# Patient Record
Sex: Female | Born: 1935 | Race: White | Hispanic: No | Smoking: Current every day smoker
Health system: Southern US, Community
[De-identification: ages and names within clinical notes are randomized; demographics above are authoritative.]

## PROBLEM LIST (undated history)

## (undated) DIAGNOSIS — E119 Type 2 diabetes mellitus without complications: Secondary | ICD-10-CM

## (undated) DIAGNOSIS — K56609 Unspecified intestinal obstruction, unspecified as to partial versus complete obstruction: Secondary | ICD-10-CM

## (undated) DIAGNOSIS — F329 Major depressive disorder, single episode, unspecified: Secondary | ICD-10-CM

## (undated) DIAGNOSIS — I10 Essential (primary) hypertension: Secondary | ICD-10-CM

## (undated) DIAGNOSIS — E785 Hyperlipidemia, unspecified: Secondary | ICD-10-CM

## (undated) DIAGNOSIS — K621 Rectal polyp: Secondary | ICD-10-CM

## (undated) DIAGNOSIS — C569 Malignant neoplasm of unspecified ovary: Secondary | ICD-10-CM

## (undated) DIAGNOSIS — F32A Depression, unspecified: Secondary | ICD-10-CM

## (undated) DIAGNOSIS — K635 Polyp of colon: Secondary | ICD-10-CM

## (undated) DIAGNOSIS — I6529 Occlusion and stenosis of unspecified carotid artery: Secondary | ICD-10-CM

## (undated) DIAGNOSIS — I2510 Atherosclerotic heart disease of native coronary artery without angina pectoris: Secondary | ICD-10-CM

## (undated) HISTORY — PX: ABDOMINAL HYSTERECTOMY: SHX81

## (undated) HISTORY — DX: Atherosclerotic heart disease of native coronary artery without angina pectoris: I25.10

## (undated) HISTORY — DX: Occlusion and stenosis of unspecified carotid artery: I65.29

## (undated) HISTORY — DX: Polyp of colon: K63.5

## (undated) HISTORY — DX: Malignant neoplasm of unspecified ovary: C56.9

## (undated) HISTORY — DX: Depression, unspecified: F32.A

## (undated) HISTORY — DX: Rectal polyp: K62.1

## (undated) HISTORY — DX: Essential (primary) hypertension: I10

## (undated) HISTORY — DX: Hyperlipidemia, unspecified: E78.5

## (undated) HISTORY — PX: ANGIOPLASTY: SHX39

## (undated) HISTORY — DX: Unspecified intestinal obstruction, unspecified as to partial versus complete obstruction: K56.609

## (undated) HISTORY — DX: Type 2 diabetes mellitus without complications: E11.9

## (undated) HISTORY — DX: Major depressive disorder, single episode, unspecified: F32.9

---

## 1998-12-28 ENCOUNTER — Inpatient Hospital Stay: Admission: RE | Admit: 1998-12-28 | Discharge: 1998-12-30 | Payer: Self-pay | Admitting: Vascular Surgery

## 1998-12-30 ENCOUNTER — Encounter: Payer: Self-pay | Admitting: Vascular Surgery

## 2001-12-13 ENCOUNTER — Inpatient Hospital Stay (HOSPITAL_COMMUNITY): Admission: EM | Admit: 2001-12-13 | Discharge: 2001-12-17 | Payer: Self-pay | Admitting: Emergency Medicine

## 2001-12-13 ENCOUNTER — Encounter: Payer: Self-pay | Admitting: Emergency Medicine

## 2002-06-18 ENCOUNTER — Encounter: Payer: Self-pay | Admitting: Cardiology

## 2002-06-18 ENCOUNTER — Inpatient Hospital Stay (HOSPITAL_COMMUNITY): Admission: AD | Admit: 2002-06-18 | Discharge: 2002-06-21 | Payer: Self-pay | Admitting: Cardiology

## 2003-01-26 ENCOUNTER — Encounter: Payer: Self-pay | Admitting: Family Medicine

## 2003-01-26 ENCOUNTER — Encounter: Admission: RE | Admit: 2003-01-26 | Discharge: 2003-01-26 | Payer: Self-pay | Admitting: Family Medicine

## 2003-01-29 ENCOUNTER — Other Ambulatory Visit: Admission: RE | Admit: 2003-01-29 | Discharge: 2003-01-29 | Payer: Self-pay | Admitting: Obstetrics and Gynecology

## 2003-02-03 ENCOUNTER — Ambulatory Visit (HOSPITAL_COMMUNITY): Admission: RE | Admit: 2003-02-03 | Discharge: 2003-02-03 | Payer: Self-pay | Admitting: *Deleted

## 2003-02-03 ENCOUNTER — Ambulatory Visit (HOSPITAL_COMMUNITY): Admission: RE | Admit: 2003-02-03 | Discharge: 2003-02-03 | Payer: Self-pay | Admitting: Gynecology

## 2003-02-03 ENCOUNTER — Encounter: Payer: Self-pay | Admitting: *Deleted

## 2003-02-03 ENCOUNTER — Encounter: Payer: Self-pay | Admitting: Gynecology

## 2003-02-04 ENCOUNTER — Ambulatory Visit: Admission: RE | Admit: 2003-02-04 | Discharge: 2003-02-04 | Payer: Self-pay | Admitting: Gynecology

## 2003-02-10 ENCOUNTER — Encounter (INDEPENDENT_AMBULATORY_CARE_PROVIDER_SITE_OTHER): Payer: Self-pay | Admitting: Specialist

## 2003-02-10 ENCOUNTER — Inpatient Hospital Stay (HOSPITAL_COMMUNITY): Admission: RE | Admit: 2003-02-10 | Discharge: 2003-02-15 | Payer: Self-pay | Admitting: Obstetrics and Gynecology

## 2003-02-12 ENCOUNTER — Encounter: Payer: Self-pay | Admitting: Internal Medicine

## 2003-02-25 ENCOUNTER — Ambulatory Visit: Admission: RE | Admit: 2003-02-25 | Discharge: 2003-02-25 | Payer: Self-pay | Admitting: Gynecology

## 2003-04-29 ENCOUNTER — Ambulatory Visit: Admission: RE | Admit: 2003-04-29 | Discharge: 2003-04-29 | Payer: Self-pay | Admitting: Gynecology

## 2003-09-15 ENCOUNTER — Ambulatory Visit: Admission: RE | Admit: 2003-09-15 | Discharge: 2003-09-15 | Payer: Self-pay | Admitting: Gynecology

## 2004-03-16 ENCOUNTER — Ambulatory Visit: Admission: RE | Admit: 2004-03-16 | Discharge: 2004-03-16 | Payer: Self-pay | Admitting: Gynecology

## 2004-06-07 ENCOUNTER — Ambulatory Visit (HOSPITAL_COMMUNITY): Admission: RE | Admit: 2004-06-07 | Discharge: 2004-06-07 | Payer: Self-pay | Admitting: Oncology

## 2004-07-06 ENCOUNTER — Encounter: Admission: RE | Admit: 2004-07-06 | Discharge: 2004-07-06 | Payer: Self-pay | Admitting: Oncology

## 2004-07-16 ENCOUNTER — Inpatient Hospital Stay (HOSPITAL_COMMUNITY): Admission: EM | Admit: 2004-07-16 | Discharge: 2004-07-19 | Payer: Self-pay | Admitting: Emergency Medicine

## 2004-08-31 ENCOUNTER — Ambulatory Visit: Admission: RE | Admit: 2004-08-31 | Discharge: 2004-08-31 | Payer: Self-pay | Admitting: Gynecology

## 2004-11-28 ENCOUNTER — Ambulatory Visit: Payer: Self-pay | Admitting: Oncology

## 2004-12-15 ENCOUNTER — Ambulatory Visit: Payer: Self-pay | Admitting: Cardiology

## 2005-03-16 ENCOUNTER — Ambulatory Visit: Payer: Self-pay | Admitting: Oncology

## 2005-03-21 ENCOUNTER — Ambulatory Visit: Admission: RE | Admit: 2005-03-21 | Discharge: 2005-03-21 | Payer: Self-pay | Admitting: Gynecology

## 2005-04-05 ENCOUNTER — Ambulatory Visit: Payer: Self-pay | Admitting: Family Medicine

## 2005-04-20 ENCOUNTER — Ambulatory Visit: Payer: Self-pay | Admitting: Family Medicine

## 2005-06-02 ENCOUNTER — Ambulatory Visit: Payer: Self-pay | Admitting: Family Medicine

## 2005-07-24 ENCOUNTER — Encounter: Admission: RE | Admit: 2005-07-24 | Discharge: 2005-07-24 | Payer: Self-pay | Admitting: Oncology

## 2005-09-06 ENCOUNTER — Ambulatory Visit: Payer: Self-pay | Admitting: Family Medicine

## 2005-09-17 ENCOUNTER — Ambulatory Visit: Payer: Self-pay | Admitting: Oncology

## 2006-01-17 ENCOUNTER — Ambulatory Visit: Payer: Self-pay | Admitting: Cardiology

## 2006-01-24 ENCOUNTER — Ambulatory Visit: Payer: Self-pay

## 2006-02-19 ENCOUNTER — Ambulatory Visit: Payer: Self-pay | Admitting: Oncology

## 2006-02-20 LAB — COMPREHENSIVE METABOLIC PANEL
ALT: 14 U/L (ref 0–40)
Albumin: 4.5 g/dL (ref 3.5–5.2)
Alkaline Phosphatase: 78 U/L (ref 39–117)
Glucose, Bld: 216 mg/dL — ABNORMAL HIGH (ref 70–99)
Potassium: 5.7 mEq/L — ABNORMAL HIGH (ref 3.5–5.3)
Sodium: 142 mEq/L (ref 135–145)
Total Bilirubin: 0.3 mg/dL (ref 0.3–1.2)
Total Protein: 7.5 g/dL (ref 6.0–8.3)

## 2006-02-20 LAB — CA 125: CA 125: 14.3 U/mL (ref 0.0–30.2)

## 2006-03-09 ENCOUNTER — Ambulatory Visit: Admission: RE | Admit: 2006-03-09 | Discharge: 2006-03-09 | Payer: Self-pay | Admitting: Gynecology

## 2006-04-03 ENCOUNTER — Ambulatory Visit: Payer: Self-pay | Admitting: Family Medicine

## 2006-04-05 ENCOUNTER — Encounter: Admission: RE | Admit: 2006-04-05 | Discharge: 2006-04-05 | Payer: Self-pay | Admitting: Family Medicine

## 2006-04-17 ENCOUNTER — Ambulatory Visit: Payer: Self-pay | Admitting: Gastroenterology

## 2006-04-20 ENCOUNTER — Ambulatory Visit: Payer: Self-pay | Admitting: Gastroenterology

## 2006-04-20 ENCOUNTER — Encounter (INDEPENDENT_AMBULATORY_CARE_PROVIDER_SITE_OTHER): Payer: Self-pay | Admitting: Specialist

## 2006-04-20 LAB — HM COLONOSCOPY

## 2006-07-25 ENCOUNTER — Encounter: Admission: RE | Admit: 2006-07-25 | Discharge: 2006-07-25 | Payer: Self-pay | Admitting: Oncology

## 2006-07-26 ENCOUNTER — Ambulatory Visit: Payer: Self-pay

## 2006-09-07 ENCOUNTER — Ambulatory Visit: Payer: Self-pay | Admitting: Oncology

## 2006-09-11 LAB — CBC WITH DIFFERENTIAL/PLATELET
Basophils Absolute: 0 10*3/uL (ref 0.0–0.1)
EOS%: 1.8 % (ref 0.0–7.0)
Eosinophils Absolute: 0.2 10*3/uL (ref 0.0–0.5)
HCT: 38.3 % (ref 34.8–46.6)
HGB: 13 g/dL (ref 11.6–15.9)
MCH: 30.2 pg (ref 26.0–34.0)
MCV: 89.3 fL (ref 81.0–101.0)
MONO%: 6 % (ref 0.0–13.0)
NEUT#: 6.2 10*3/uL (ref 1.5–6.5)
NEUT%: 64.9 % (ref 39.6–76.8)

## 2006-09-11 LAB — COMPREHENSIVE METABOLIC PANEL
AST: 16 U/L (ref 0–37)
Albumin: 4.2 g/dL (ref 3.5–5.2)
Alkaline Phosphatase: 70 U/L (ref 39–117)
BUN: 21 mg/dL (ref 6–23)
Calcium: 9.5 mg/dL (ref 8.4–10.5)
Chloride: 104 mEq/L (ref 96–112)
Creatinine, Ser: 0.98 mg/dL (ref 0.40–1.20)
Glucose, Bld: 187 mg/dL — ABNORMAL HIGH (ref 70–99)
Potassium: 5.4 mEq/L — ABNORMAL HIGH (ref 3.5–5.3)

## 2006-12-31 ENCOUNTER — Ambulatory Visit: Payer: Self-pay | Admitting: Cardiology

## 2007-02-22 ENCOUNTER — Ambulatory Visit: Payer: Self-pay | Admitting: Oncology

## 2007-03-22 ENCOUNTER — Ambulatory Visit: Admission: RE | Admit: 2007-03-22 | Discharge: 2007-03-22 | Payer: Self-pay | Admitting: Gynecology

## 2007-04-16 ENCOUNTER — Ambulatory Visit: Payer: Self-pay | Admitting: Family Medicine

## 2007-04-23 ENCOUNTER — Ambulatory Visit: Payer: Self-pay | Admitting: Family Medicine

## 2007-07-30 ENCOUNTER — Ambulatory Visit: Payer: Self-pay

## 2007-08-16 ENCOUNTER — Ambulatory Visit: Payer: Self-pay | Admitting: Family Medicine

## 2007-08-16 DIAGNOSIS — B369 Superficial mycosis, unspecified: Secondary | ICD-10-CM | POA: Insufficient documentation

## 2007-08-16 DIAGNOSIS — E785 Hyperlipidemia, unspecified: Secondary | ICD-10-CM

## 2007-08-16 DIAGNOSIS — F329 Major depressive disorder, single episode, unspecified: Secondary | ICD-10-CM

## 2007-08-16 DIAGNOSIS — I10 Essential (primary) hypertension: Secondary | ICD-10-CM | POA: Insufficient documentation

## 2007-08-16 DIAGNOSIS — E119 Type 2 diabetes mellitus without complications: Secondary | ICD-10-CM | POA: Insufficient documentation

## 2007-09-06 ENCOUNTER — Ambulatory Visit: Payer: Self-pay | Admitting: Oncology

## 2007-09-09 ENCOUNTER — Encounter: Admission: RE | Admit: 2007-09-09 | Discharge: 2007-09-09 | Payer: Self-pay | Admitting: Family Medicine

## 2007-09-10 LAB — CBC WITH DIFFERENTIAL/PLATELET
Basophils Absolute: 0.1 10*3/uL (ref 0.0–0.1)
Eosinophils Absolute: 0.4 10*3/uL (ref 0.0–0.5)
HCT: 32.8 % — ABNORMAL LOW (ref 34.8–46.6)
HGB: 11.6 g/dL (ref 11.6–15.9)
MCH: 30.9 pg (ref 26.0–34.0)
MONO#: 0.6 10*3/uL (ref 0.1–0.9)
NEUT#: 7 10*3/uL — ABNORMAL HIGH (ref 1.5–6.5)
NEUT%: 69.8 % (ref 39.6–76.8)
RDW: 12.7 % (ref 11.3–14.5)
WBC: 10.1 10*3/uL — ABNORMAL HIGH (ref 3.9–10.0)
lymph#: 2 10*3/uL (ref 0.9–3.3)

## 2007-09-10 LAB — COMPREHENSIVE METABOLIC PANEL
AST: 16 U/L (ref 0–37)
Albumin: 4.2 g/dL (ref 3.5–5.2)
BUN: 21 mg/dL (ref 6–23)
CO2: 25 mEq/L (ref 19–32)
Calcium: 9.5 mg/dL (ref 8.4–10.5)
Chloride: 105 mEq/L (ref 96–112)
Creatinine, Ser: 0.93 mg/dL (ref 0.40–1.20)
Glucose, Bld: 144 mg/dL — ABNORMAL HIGH (ref 70–99)
Potassium: 4.8 mEq/L (ref 3.5–5.3)

## 2007-09-10 LAB — CA 125: CA 125: 13.9 U/mL (ref 0.0–30.2)

## 2008-01-03 ENCOUNTER — Ambulatory Visit: Payer: Self-pay | Admitting: Cardiology

## 2008-02-25 ENCOUNTER — Ambulatory Visit: Payer: Self-pay | Admitting: Oncology

## 2008-02-28 LAB — CA 125: CA 125: 16.1 U/mL (ref 0.0–30.2)

## 2008-03-04 ENCOUNTER — Ambulatory Visit: Admission: RE | Admit: 2008-03-04 | Discharge: 2008-03-04 | Payer: Self-pay | Admitting: Gynecology

## 2008-03-05 ENCOUNTER — Ambulatory Visit: Payer: Self-pay | Admitting: Family Medicine

## 2008-03-05 DIAGNOSIS — R197 Diarrhea, unspecified: Secondary | ICD-10-CM | POA: Insufficient documentation

## 2008-03-10 ENCOUNTER — Ambulatory Visit: Payer: Self-pay | Admitting: Family Medicine

## 2008-03-10 LAB — CONVERTED CEMR LAB: Blood Glucose, Fingerstick: 272

## 2008-03-30 ENCOUNTER — Ambulatory Visit: Payer: Self-pay | Admitting: Family Medicine

## 2008-03-30 DIAGNOSIS — C569 Malignant neoplasm of unspecified ovary: Secondary | ICD-10-CM

## 2008-03-30 LAB — CONVERTED CEMR LAB
BUN: 14 mg/dL (ref 6–23)
Chloride: 98 meq/L (ref 96–112)
Glucose, Bld: 92 mg/dL (ref 70–99)
Hgb A1c MFr Bld: 6.8 % — ABNORMAL HIGH (ref 4.6–6.0)
Microalb Creat Ratio: 49.2 mg/g — ABNORMAL HIGH (ref 0.0–30.0)
Microalb, Ur: 5.1 mg/dL — ABNORMAL HIGH (ref 0.0–1.9)
Potassium: 4.3 meq/L (ref 3.5–5.1)
Sodium: 139 meq/L (ref 135–145)

## 2008-04-04 LAB — CONVERTED CEMR LAB
ALT: 17 units/L (ref 0–40)
AST: 20 units/L (ref 0–37)
Albumin: 3.9 g/dL (ref 3.5–5.2)
Alkaline Phosphatase: 62 units/L (ref 39–117)
BUN: 18 mg/dL (ref 6–23)
Basophils Absolute: 0.1 10*3/uL (ref 0.0–0.1)
Basophils Relative: 0.7 % (ref 0.0–1.0)
Bilirubin, Direct: 0.1 mg/dL (ref 0.0–0.3)
CO2: 26 meq/L (ref 19–32)
Calcium: 9.2 mg/dL (ref 8.4–10.5)
Chloride: 104 meq/L (ref 96–112)
Cholesterol: 137 mg/dL (ref 0–200)
Creatinine, Ser: 0.9 mg/dL (ref 0.4–1.2)
Creatinine,U: 93.8 mg/dL
Direct LDL: 58.4 mg/dL
Eosinophils Absolute: 0.2 10*3/uL (ref 0.0–0.6)
Eosinophils Relative: 2.5 % (ref 0.0–5.0)
GFR calc Af Amer: 78 mL/min
GFR calc non Af Amer: 64 mL/min
Glucose, Bld: 107 mg/dL — ABNORMAL HIGH (ref 70–99)
HCT: 38.6 % (ref 36.0–46.0)
HDL: 32.6 mg/dL — ABNORMAL LOW (ref >39.0)
Hemoglobin: 12.9 g/dL (ref 12.0–15.0)
Hgb A1c MFr Bld: 6.6 % — ABNORMAL HIGH (ref 4.6–6.0)
Lymphocytes Relative: 27.3 % (ref 12.0–46.0)
MCHC: 33.4 g/dL (ref 30.0–36.0)
MCV: 89 fL (ref 78.0–100.0)
Microalb Creat Ratio: 23.5 mg/g (ref 0.0–30.0)
Microalb, Ur: 2.2 mg/dL — ABNORMAL HIGH (ref 0.0–1.9)
Monocytes Absolute: 0.8 10*3/uL — ABNORMAL HIGH (ref 0.2–0.7)
Monocytes Relative: 7.7 % (ref 3.0–11.0)
Neutro Abs: 6.2 10*3/uL (ref 1.4–7.7)
Neutrophils Relative %: 61.8 % (ref 43.0–77.0)
Platelets: 277 10*3/uL (ref 150–400)
Potassium: 4.5 meq/L (ref 3.5–5.1)
RBC: 4.34 M/uL (ref 3.87–5.11)
RDW: 13.9 % (ref 11.5–14.6)
Sodium: 139 meq/L (ref 135–145)
TSH: 3.01 microintl units/mL (ref 0.35–5.50)
Total Bilirubin: 0.3 mg/dL (ref 0.3–1.2)
Total CHOL/HDL Ratio: 4.2
Total Protein: 6.9 g/dL (ref 6.0–8.3)
Triglycerides: 303 mg/dL (ref 0–149)
VLDL: 61 mg/dL — ABNORMAL HIGH (ref 0–40)
WBC: 10 10*3/uL (ref 4.5–10.5)

## 2008-04-06 ENCOUNTER — Telehealth: Payer: Self-pay | Admitting: *Deleted

## 2008-07-30 ENCOUNTER — Ambulatory Visit: Payer: Self-pay | Admitting: Cardiology

## 2008-09-09 ENCOUNTER — Encounter: Admission: RE | Admit: 2008-09-09 | Discharge: 2008-09-09 | Payer: Self-pay | Admitting: Family Medicine

## 2008-12-28 ENCOUNTER — Ambulatory Visit: Payer: Self-pay | Admitting: Cardiology

## 2009-03-19 ENCOUNTER — Telehealth: Payer: Self-pay | Admitting: Cardiology

## 2009-05-28 ENCOUNTER — Ambulatory Visit: Payer: Self-pay | Admitting: Internal Medicine

## 2009-05-28 ENCOUNTER — Inpatient Hospital Stay (HOSPITAL_COMMUNITY): Admission: EM | Admit: 2009-05-28 | Discharge: 2009-06-02 | Payer: Self-pay | Admitting: Emergency Medicine

## 2009-05-28 ENCOUNTER — Encounter (INDEPENDENT_AMBULATORY_CARE_PROVIDER_SITE_OTHER): Payer: Self-pay | Admitting: *Deleted

## 2009-05-28 ENCOUNTER — Ambulatory Visit: Payer: Self-pay | Admitting: Endocrinology

## 2009-05-29 ENCOUNTER — Telehealth: Payer: Self-pay | Admitting: Internal Medicine

## 2009-06-07 ENCOUNTER — Ambulatory Visit: Payer: Self-pay | Admitting: Family Medicine

## 2009-06-07 DIAGNOSIS — R634 Abnormal weight loss: Secondary | ICD-10-CM

## 2009-06-07 DIAGNOSIS — G4700 Insomnia, unspecified: Secondary | ICD-10-CM | POA: Insufficient documentation

## 2009-06-10 LAB — CONVERTED CEMR LAB
Albumin: 3.6 g/dL (ref 3.5–5.2)
Basophils Absolute: 0 10*3/uL (ref 0.0–0.1)
Basophils Relative: 0.4 % (ref 0.0–3.0)
CO2: 30 meq/L (ref 19–32)
Calcium: 9.2 mg/dL (ref 8.4–10.5)
Chloride: 104 meq/L (ref 96–112)
Creatinine, Ser: 0.8 mg/dL (ref 0.4–1.2)
Eosinophils Absolute: 0.3 10*3/uL (ref 0.0–0.7)
Folate: >20 ng/mL
Glucose, Bld: 94 mg/dL (ref 70–99)
Hemoglobin: 12.4 g/dL (ref 12.0–15.0)
Lymphocytes Relative: 20.9 % (ref 12.0–46.0)
Lymphs Abs: 2.2 10*3/uL (ref 0.7–4.0)
MCHC: 33.9 g/dL (ref 30.0–36.0)
MCV: 87.9 fL (ref 78.0–100.0)
Monocytes Absolute: 1 10*3/uL (ref 0.1–1.0)
Neutro Abs: 7.2 10*3/uL (ref 1.4–7.7)
RBC: 4.15 M/uL (ref 3.87–5.11)
RDW: 14.5 % (ref 11.5–14.6)
Saturation Ratios: 14.3 % — ABNORMAL LOW (ref 20.0–50.0)
Sodium: 142 meq/L (ref 135–145)
TSH: 3.06 microintl units/mL (ref 0.35–5.50)
Total Protein: 7.6 g/dL (ref 6.0–8.3)
Vitamin B-12: 664 pg/mL (ref 211–911)

## 2009-06-22 ENCOUNTER — Encounter: Payer: Self-pay | Admitting: Cardiology

## 2009-07-12 ENCOUNTER — Ambulatory Visit: Payer: Self-pay | Admitting: Gastroenterology

## 2009-07-21 ENCOUNTER — Ambulatory Visit (HOSPITAL_COMMUNITY): Admission: RE | Admit: 2009-07-21 | Discharge: 2009-07-21 | Payer: Self-pay | Admitting: Gastroenterology

## 2009-07-22 ENCOUNTER — Telehealth: Payer: Self-pay | Admitting: Gastroenterology

## 2009-07-22 DIAGNOSIS — R6881 Early satiety: Secondary | ICD-10-CM

## 2009-07-23 ENCOUNTER — Ambulatory Visit: Payer: Self-pay | Admitting: Gastroenterology

## 2009-07-28 ENCOUNTER — Encounter: Payer: Self-pay | Admitting: Gastroenterology

## 2009-07-28 ENCOUNTER — Ambulatory Visit: Payer: Self-pay | Admitting: Gastroenterology

## 2009-07-30 ENCOUNTER — Encounter: Payer: Self-pay | Admitting: Gastroenterology

## 2009-08-04 ENCOUNTER — Ambulatory Visit: Payer: Self-pay

## 2009-08-06 ENCOUNTER — Encounter: Payer: Self-pay | Admitting: Cardiology

## 2009-09-22 ENCOUNTER — Telehealth: Payer: Self-pay | Admitting: Family Medicine

## 2009-09-28 ENCOUNTER — Encounter: Payer: Self-pay | Admitting: Cardiology

## 2009-11-05 ENCOUNTER — Encounter (INDEPENDENT_AMBULATORY_CARE_PROVIDER_SITE_OTHER): Payer: Self-pay | Admitting: *Deleted

## 2009-12-29 ENCOUNTER — Encounter: Payer: Self-pay | Admitting: Cardiology

## 2010-01-03 ENCOUNTER — Ambulatory Visit: Payer: Self-pay | Admitting: Cardiology

## 2010-02-07 ENCOUNTER — Encounter: Payer: Self-pay | Admitting: Cardiology

## 2010-02-07 DIAGNOSIS — I6529 Occlusion and stenosis of unspecified carotid artery: Secondary | ICD-10-CM | POA: Insufficient documentation

## 2010-02-08 ENCOUNTER — Ambulatory Visit: Payer: Self-pay

## 2010-02-08 ENCOUNTER — Encounter: Payer: Self-pay | Admitting: Cardiology

## 2010-03-31 ENCOUNTER — Encounter: Payer: Self-pay | Admitting: Cardiology

## 2010-04-06 ENCOUNTER — Encounter: Payer: Self-pay | Admitting: Cardiology

## 2010-06-03 ENCOUNTER — Telehealth: Payer: Self-pay | Admitting: Family Medicine

## 2010-07-07 ENCOUNTER — Encounter: Payer: Self-pay | Admitting: Cardiology

## 2010-08-08 ENCOUNTER — Encounter: Payer: Self-pay | Admitting: Cardiology

## 2010-08-09 ENCOUNTER — Ambulatory Visit: Payer: Self-pay

## 2010-08-09 ENCOUNTER — Encounter: Payer: Self-pay | Admitting: Cardiology

## 2010-10-11 ENCOUNTER — Encounter: Payer: Self-pay | Admitting: Cardiology

## 2010-10-17 ENCOUNTER — Telehealth: Payer: Self-pay | Admitting: Family Medicine

## 2010-11-14 ENCOUNTER — Emergency Department (HOSPITAL_COMMUNITY)
Admission: EM | Admit: 2010-11-14 | Discharge: 2010-11-15 | Payer: Self-pay | Source: Home / Self Care | Admitting: Emergency Medicine

## 2010-11-16 LAB — CBC
HCT: 39.9 % (ref 36.0–46.0)
Hemoglobin: 13.1 g/dL (ref 12.0–15.0)
MCH: 28.8 pg (ref 26.0–34.0)
MCHC: 32.8 g/dL (ref 30.0–36.0)
MCV: 87.7 fL (ref 78.0–100.0)
Platelets: 287 10*3/uL (ref 150–400)
RBC: 4.55 MIL/uL (ref 3.87–5.11)
RDW: 14.4 % (ref 11.5–15.5)
WBC: 14.5 10*3/uL — ABNORMAL HIGH (ref 4.0–10.5)

## 2010-11-16 LAB — COMPREHENSIVE METABOLIC PANEL
ALT: 15 U/L (ref 0–35)
AST: 21 U/L (ref 0–37)
Albumin: 3.5 g/dL (ref 3.5–5.2)
Alkaline Phosphatase: 73 U/L (ref 39–117)
BUN: 13 mg/dL (ref 6–23)
CO2: 26 mEq/L (ref 19–32)
Calcium: 9.3 mg/dL (ref 8.4–10.5)
Chloride: 104 mEq/L (ref 96–112)
Creatinine, Ser: 0.78 mg/dL (ref 0.4–1.2)
GFR calc Af Amer: >60 mL/min (ref >60)
GFR calc non Af Amer: >60 mL/min (ref >60)
Glucose, Bld: 164 mg/dL — ABNORMAL HIGH (ref 70–99)
Potassium: 5 mEq/L (ref 3.5–5.1)
Sodium: 139 mEq/L (ref 135–145)
Total Bilirubin: 0.4 mg/dL (ref 0.3–1.2)
Total Protein: 7 g/dL (ref 6.0–8.3)

## 2010-11-16 LAB — DIFFERENTIAL
Basophils Absolute: 0.1 10*3/uL (ref 0.0–0.1)
Basophils Relative: 0 % (ref 0–1)
Eosinophils Absolute: 0.2 10*3/uL (ref 0.0–0.7)
Eosinophils Relative: 1 % (ref 0–5)
Lymphocytes Relative: 13 % (ref 12–46)
Lymphs Abs: 1.8 10*3/uL (ref 0.7–4.0)
Monocytes Absolute: 0.8 10*3/uL (ref 0.1–1.0)
Monocytes Relative: 5 % (ref 3–12)
Neutro Abs: 11.7 10*3/uL — ABNORMAL HIGH (ref 1.7–7.7)
Neutrophils Relative %: 81 % — ABNORMAL HIGH (ref 43–77)

## 2010-11-17 ENCOUNTER — Telehealth: Payer: Self-pay | Admitting: Family Medicine

## 2010-11-17 DIAGNOSIS — R42 Dizziness and giddiness: Secondary | ICD-10-CM | POA: Insufficient documentation

## 2010-11-29 NOTE — Progress Notes (Signed)
Summary: [name] refill  Phone Note Refill Request Message from:  Fax from Pharmacy on June 03, 2010 8:58 AM  Refills Requested: Medication #1:  AMBIEN 5 MG TABS 1/2 at bedtime as needed. Initial call taken by: Kern Reap CMA Duncan Dull),  June 03, 2010 8:58 AM Caller: [facility] [name] DrMarland Kitchen    Prescriptions: [name] 5 MG TABS (ZOLPIDEM TARTRATE) 1/2 at bedtime as needed  #30 x 3   Entered by:   Kern Reap CMA (AAMA)   Authorized by:   Roderick Pee MD   Signed by:   Kern Reap CMA (AAMA) on 06/03/2010   Method used:   Historical   RxID:   [id number]

## 2010-11-29 NOTE — Letter (Signed)
Summary: Appointment - Reminder 2  [facility], Main Office  [address]   [place], [place] [address]   Phone: [phone]  Fax: [phone]     November 05, 2009 MRN: [id number]   [name] [address] [place], [place]  [address]   Dear [name],  Our records indicate that it is time to schedule a follow-up appointment. Dr.Hochrein recommended that you follow up with [place] in March,2011. It is very important that we reach you to schedule this appointment. We look forward to participating in your health care needs. Please contact us at the number listed above at your earliest convenience to schedule your appointment.  If you are unable to make an appointment at this time, give [place] a call so we can update our records.     Sincerely,   Glass blower/designer

## 2010-11-29 NOTE — Miscellaneous (Signed)
Summary: Orders Update  Clinical Lists Changes  Orders: Added new Test order of Carotid Duplex (Carotid Duplex) - Signed 

## 2010-11-29 NOTE — Progress Notes (Signed)
Summary: [facility]   Imported By: Earl Many 04/06/2010 16:44:41  _____________________________________________________________________  External Attachment:    Type:   Image     Comment:   External Document

## 2010-11-29 NOTE — Assessment & Plan Note (Signed)
Summary: 1 yr rov f/u 414.01  pfh   Visit Type:  Follow-up Primary Provider:  Kelle Darting, MD   CC:  CAD.  History of Present Illness: The patient presents for follow up of her known CAD.  Since I last saw her she has had no new cardiac complaints.  She denies chest pain, neck or arm pain.  She has had no new dyspnea and denies PND or orthopnea.  She has had some mild ankle edema.  She has her diabetes and lipids followed by her her endocrinologist. She reports she's had excellent lipids and hemoglobin A1c.  Current Medications (verified): 1)  Lasix 20 Mg  Tabs (Furosemide) .... One Qam 2)  Metformin Hcl 1000 Mg Tabs (Metformin Hcl) .... 1/2 Two Times A Day 3)  Metoprolol Tartrate 100 Mg Tabs (Metoprolol Tartrate) .... Take 1/2 in Am and 1/2 in Pm 4)  Nifedipine Cr Osmotic 90 Mg Tb24 (Nifedipine) .... Once Daily 5)  Nitroquick 0.4 Mg Subl (Nitroglycerin) .... Place 1 Tablet Under Tongue As Directed 6)  Zocor 40 Mg Tabs (Simvastatin) .Marland Kitchen.. 1 Once Daily 7)  Truetrack Test   Strp (Glucose Blood) .... Two Times A Day 8)  Aspirin 81 Mg  Tabs (Aspirin) .Marland Kitchen.. 1 By Mouth Daily 9)  Calcium-Vitamin D 600-125 Mg-Unit  Tabs (Calcium-Vitamin D) .... Once Daily 10)  Fish Oil 1000 Mg  Caps (Omega-3 Fatty Acids) .... Once Daily 11)  Ramipril 10 Mg Caps (Ramipril) .... Take One Tab Once Daily 12)  Januvia 100 Mg Tabs (Sitagliptin Phosphate) .... Take One Tab Once Daily 13)  Ra Melatonin/b-6 500-5 Mcg-Mg Tabs (Nutritional Supplements) .... Take One Tab Once Daily 14)  Ambien 5 Mg Tabs (Zolpidem Tartrate) .... 1/2 At Bedtime As Needed  Allergies (verified): No Known Drug Allergies  Past History:  Past Medical History: Coronary artery disease (inferior myocardial infarction       2002, treated with streptokinase.  Cath showed a 50% right coronary       artery stenosis at that time.  The patient had a catheterization in       2002 demonstrated 75% RCA stenosis, long 60% proximal RCA stenosis,   50% mid RCA stenosis, 90% mid-LAD stenosis.  Dr. Juanda Chance performed       stenting of the RCA in the LAD.  Last catheterization was in 2005       demonstrating a 95% in-stent restenosis in the right coronary       artery.  This was treated by Dr. Juanda Chance with a Taxus drug-eluting       stent).  Depression Diabetes mellitus, type II Hyperlipidemia Hypertension Adenomatous polyp colon Rectal hyperplastic polyp Ovarian cancer,cardiac stents x 2 Bilateral cataracts Small Bowel Obstruction PVD  Past Surgical History: Hysterectomy Angioplasty/stent   Review of Systems       As stated in the HPI and negative for all other systems.   Vital Signs:  Patient profile:   77 year old female Height:      59.25 inches Weight:      94 pounds BMI:     18.89 Pulse rate:   61 / minute Resp:     16 per minute BP sitting:   160 / 62  (right arm)  Vitals Entered By: Marrion Coy, CNA (January 03, 2010 12:12 PM)  Physical Exam  General:  Well developed, well nourished, in no acute distress. Head:  normocephalic and atraumatic Eyes:  PERRLA/EOM intact; conjunctiva and lids normal. Mouth:  Oral mucosa normal.  Neck:  Neck supple, no JVD. No masses, thyromegaly or abnormal cervical nodes. Lungs:  Clear bilaterally to auscultation and percussion. Heart:  Non-displaced PMI, chest non-tender; regular rate and rhythm, S1, S2 without murmurs, rubs or gallops. Carotid upstroke normal, bilateral carotid bruits. Normal abdominal aortic size, no bruits. Femorals normal pulses, no bruits. Pedals normal pulses. No edema, no varicosities. Abdomen:  Bowel sounds positive; abdomen soft and non-tender without masses, organomegaly, or hernias noted. No hepatosplenomegaly. Msk:  Back normal, normal gait. Muscle strength and tone normal. Extremities:  No clubbing or cyanosis. Neurologic:  Alert and oriented x 3. Skin:  Intact without lesions or rashes. Cervical Nodes:  no significant adenopathy Axillary Nodes:  no  significant adenopathy Inguinal Nodes:  no significant adenopathy Psych:  Normal affect.   EKG  Procedure date:  01/03/2010  Findings:      NSR, rate 61, RBBB, no acute ST-T wave changes  Impression & Recommendations:  Problem # 1:  CORONARY ARTERY DISEASE (ICD-414.00) The patient has had no new symptoms. She will continue with risk reduction. Orders: EKG w/ Interpretation (93000)  Problem # 2:  HYPERTENSION (ICD-401.9) Her blood pressure is elevated today but she reports that it is well controlled at home in the 120/60 range. She says her blood pressure cuff is accurate. I will make no change to her regimen although she is instructed to get her blood pressure cuff readings correlated with readings in the physician office and to keep a blood pressure diary.  Problem # 3:  HYPERLIPIDEMIA (ICD-272.4) She reports excellent readings as described above. I will defer to her endocrinologist with a goal LDL less than 70 and HDL rated and 50.  Patient Instructions: 1)  Your physician recommends that you schedule a follow-up appointment in: 1 year with Dr Antoine Poche 2)  Your physician recommends that you continue on your current medications as directed. Please refer to the Current Medication list given to you today.

## 2010-11-29 NOTE — Letter (Signed)
Summary: [facility] Note  [facility] Note   Imported By: Roderic Ovens 01/13/2010 14:50:44  _____________________________________________________________________  External Attachment:    Type:   Image     Comment:   External Document

## 2010-11-29 NOTE — Letter (Signed)
Summary: [facility] Progress Note   [facility] Progress Note   Imported By: Roderic Ovens 07/29/2010 11:54:37  _____________________________________________________________________  External Attachment:    Type:   Image     Comment:   External Document

## 2010-11-29 NOTE — Miscellaneous (Signed)
Summary: Orders Update  Clinical Lists Changes  Problems: Added new problem of CAROTID ARTERY DISEASE (ICD-433.10) Orders: Added new Test order of Carotid Duplex (Carotid Duplex) - Signed 

## 2010-12-01 NOTE — Progress Notes (Signed)
Summary: lost rx  Phone Note Refill Request Message from:  Fax from Pharmacy on October 17, 2010 5:30 PM  Refills Requested: Medication #1:  AMBIEN 5 MG TABS 1/2 at bedtime as needed. patient states she has lost her rx and requests refill.  okay to fill [facility]? [facility]  Initial call taken by: Kern Reap CMA Duncan Dull),  October 17, 2010 5:31 PM  Follow-up for Phone Call        dispense 30 tablets, directions one half nightly, p.r.n. refills x 3 Follow-up by: Roderick Pee MD,  October 17, 2010 5:33 PM     Appended Document: lost rx dispense 30 tablets, directions one half nightly, p.r.n. refills x 3  Appended Document: lost rx rx called into pharmacy and patient is aware

## 2010-12-01 NOTE — Letter (Signed)
Summary: [facility] Progress Note   [facility] Progress Note   Imported By: Roderic Ovens 10/27/2010 10:08:57  _____________________________________________________________________  External Attachment:    Type:   Image     Comment:   External Document

## 2010-12-01 NOTE — Progress Notes (Signed)
Summary: REQUEST FOR APPT?  Phone Note Call from Patient   Caller: Daughter    [phone] Summary of Call: Daughter called to adv that pt has been exp dizziness..... Went to ED for eval, dx: vertigo - adv pt to take otc meds.... Adv that sxs worse today and she needs to come in to see Dr Tawanna Cooler today.... Offered appt for tomorrow morning but pts daughter declined stating that her mom needs to see someone today.... Can you advise?   # [phone].  Initial call taken by: Debbra Riding,  November 17, 2010 11:11 AM  Follow-up for Phone Call        Fleet Contras please call......... unfortunately the only treatment for vertigo is bedrest.  There is no medication.  That will make it go away.  She can try some over-the-counter Antivert 25 mg 3 times a day.if the vertigo persists for more than a week or two and then we recommend ENT consult.  Dr. Ezzard Standing Follow-up by: Roderick Pee MD,  November 17, 2010 11:30 AM  Additional Follow-up for Phone Call Additional follow up Details #1::        Spoke with pt and she adv that she has been trying otc meds... adv was given Rx by ED physician for the Antivert but she thinks she may be having adverse side effects from med (nausea) may be related to dizziness?... She adv this has been going on for a while and she would like appt with Dr Ezzard Standing this week if possible (referral to ent).... Daughter Inocencio Homes) got on the line and reinterated same.  Additional Follow-up by: Debbra Riding,  November 17, 2010 11:58 AM  New Problems: VERTIGO (ICD-780.4)   New Problems: VERTIGO (ICD-780.4)

## 2011-02-04 LAB — BASIC METABOLIC PANEL
BUN: 6 mg/dL (ref 6–23)
CO2: 22 mEq/L (ref 19–32)
CO2: 25 mEq/L (ref 19–32)
CO2: 27 mEq/L (ref 19–32)
Calcium: 7.9 mg/dL — ABNORMAL LOW (ref 8.4–10.5)
Calcium: 8.3 mg/dL — ABNORMAL LOW (ref 8.4–10.5)
Chloride: 109 mEq/L (ref 96–112)
Creatinine, Ser: 0.58 mg/dL (ref 0.4–1.2)
Creatinine, Ser: 0.7 mg/dL (ref 0.4–1.2)
GFR calc Af Amer: >60 mL/min (ref >60)
GFR calc Af Amer: >60 mL/min (ref >60)
GFR calc Af Amer: >60 mL/min (ref >60)
GFR calc non Af Amer: >60 mL/min (ref >60)
GFR calc non Af Amer: >60 mL/min (ref >60)
Glucose, Bld: 103 mg/dL — ABNORMAL HIGH (ref 70–99)
Glucose, Bld: 105 mg/dL — ABNORMAL HIGH (ref 70–99)
Potassium: 2.8 mEq/L — ABNORMAL LOW (ref 3.5–5.1)
Potassium: 4.7 mEq/L (ref 3.5–5.1)
Sodium: 136 mEq/L (ref 135–145)
Sodium: 138 mEq/L (ref 135–145)
Sodium: 139 mEq/L (ref 135–145)
Sodium: 141 mEq/L (ref 135–145)

## 2011-02-04 LAB — CBC
HCT: 29 % — ABNORMAL LOW (ref 36.0–46.0)
HCT: 33.5 % — ABNORMAL LOW (ref 36.0–46.0)
Hemoglobin: 10.7 g/dL — ABNORMAL LOW (ref 12.0–15.0)
Hemoglobin: 11.4 g/dL — ABNORMAL LOW (ref 12.0–15.0)
Hemoglobin: 9.8 g/dL — ABNORMAL LOW (ref 12.0–15.0)
MCHC: 33.5 g/dL (ref 30.0–36.0)
MCHC: 33.7 g/dL (ref 30.0–36.0)
MCHC: 33.9 g/dL (ref 30.0–36.0)
MCV: 87.3 fL (ref 78.0–100.0)
MCV: 87.9 fL (ref 78.0–100.0)
Platelets: 196 10*3/uL (ref 150–400)
RBC: 3.82 MIL/uL — ABNORMAL LOW (ref 3.87–5.11)
RDW: 14.7 % (ref 11.5–15.5)
RDW: 14.7 % (ref 11.5–15.5)
RDW: 15.2 % (ref 11.5–15.5)

## 2011-02-04 LAB — POTASSIUM: Potassium: 3.8 mEq/L (ref 3.5–5.1)

## 2011-02-04 LAB — LIPID PANEL
HDL: 33 mg/dL — ABNORMAL LOW (ref >39)
LDL Cholesterol: 58 mg/dL (ref 0–99)
Triglycerides: 76 mg/dL (ref <150)

## 2011-02-04 LAB — CA 125: CA 125: 9.7 U/mL (ref 0.0–30.2)

## 2011-02-04 LAB — MAGNESIUM: Magnesium: 1.9 mg/dL (ref 1.5–2.5)

## 2011-02-04 LAB — CARDIAC PANEL(CRET KIN+CKTOT+MB+TROPI)
Relative Index: INVALID (ref 0.0–2.5)
Troponin I: 0.06 ng/mL (ref 0.00–0.06)

## 2011-02-04 LAB — RETICULOCYTES: Retic Ct Pct: 1.2 % (ref 0.4–3.1)

## 2011-02-04 LAB — GLUCOSE, CAPILLARY
Glucose-Capillary: 103 mg/dL — ABNORMAL HIGH (ref 70–99)
Glucose-Capillary: 131 mg/dL — ABNORMAL HIGH (ref 70–99)
Glucose-Capillary: 135 mg/dL — ABNORMAL HIGH (ref 70–99)
Glucose-Capillary: 144 mg/dL — ABNORMAL HIGH (ref 70–99)
Glucose-Capillary: 96 mg/dL (ref 70–99)

## 2011-02-04 LAB — HEMOGLOBIN A1C
Hgb A1c MFr Bld: 6.6 % — ABNORMAL HIGH (ref 4.6–6.1)
Mean Plasma Glucose: 143 mg/dL

## 2011-02-04 LAB — IRON AND TIBC: TIBC: 233 ug/dL — ABNORMAL LOW (ref 250–470)

## 2011-02-04 LAB — FOLATE: Folate: >20 ng/mL

## 2011-02-05 LAB — URINE MICROSCOPIC-ADD ON

## 2011-02-05 LAB — URINALYSIS, ROUTINE W REFLEX MICROSCOPIC
Bilirubin Urine: NEGATIVE
Glucose, UA: NEGATIVE mg/dL
Leukocytes, UA: NEGATIVE
Nitrite: NEGATIVE
Specific Gravity, Urine: 1.017 (ref 1.005–1.030)
pH: 7.5 (ref 5.0–8.0)

## 2011-02-05 LAB — CBC
HCT: 40.2 % (ref 36.0–46.0)
Hemoglobin: 11.9 g/dL — ABNORMAL LOW (ref 12.0–15.0)
MCHC: 33.5 g/dL (ref 30.0–36.0)
MCV: 87.6 fL (ref 78.0–100.0)
Platelets: 288 10*3/uL (ref 150–400)
RBC: 4.03 MIL/uL (ref 3.87–5.11)
RDW: 15 % (ref 11.5–15.5)

## 2011-02-05 LAB — COMPREHENSIVE METABOLIC PANEL
ALT: 13 U/L (ref 0–35)
AST: 29 U/L (ref 0–37)
Albumin: 3.9 g/dL (ref 3.5–5.2)
Alkaline Phosphatase: 71 U/L (ref 39–117)
BUN: 19 mg/dL (ref 6–23)
CO2: 29 mEq/L (ref 19–32)
Calcium: 9.5 mg/dL (ref 8.4–10.5)
Chloride: 103 mEq/L (ref 96–112)
Chloride: 98 mEq/L (ref 96–112)
Creatinine, Ser: 0.92 mg/dL (ref 0.4–1.2)
GFR calc Af Amer: >60 mL/min (ref >60)
GFR calc non Af Amer: >60 mL/min (ref >60)
Glucose, Bld: 189 mg/dL — ABNORMAL HIGH (ref 70–99)
Potassium: 3.6 mEq/L (ref 3.5–5.1)
Sodium: 140 mEq/L (ref 135–145)
Total Bilirubin: 0.5 mg/dL (ref 0.3–1.2)
Total Bilirubin: 0.6 mg/dL (ref 0.3–1.2)

## 2011-02-05 LAB — CARDIAC PANEL(CRET KIN+CKTOT+MB+TROPI)
CK, MB: 1.9 ng/mL (ref 0.3–4.0)
Total CK: 30 U/L (ref 7–177)
Total CK: 45 U/L (ref 7–177)
Troponin I: 0.15 ng/mL — ABNORMAL HIGH (ref 0.00–0.06)

## 2011-02-05 LAB — DIFFERENTIAL
Basophils Absolute: 0 10*3/uL (ref 0.0–0.1)
Lymphocytes Relative: 11 % — ABNORMAL LOW (ref 12–46)
Lymphs Abs: 1.4 10*3/uL (ref 0.7–4.0)
Monocytes Absolute: 0.5 10*3/uL (ref 0.1–1.0)
Neutro Abs: 11.1 10*3/uL — ABNORMAL HIGH (ref 1.7–7.7)

## 2011-02-05 LAB — URINE CULTURE

## 2011-02-05 LAB — LIPASE, BLOOD: Lipase: 19 U/L (ref 11–59)

## 2011-02-05 LAB — GLUCOSE, CAPILLARY
Glucose-Capillary: 165 mg/dL — ABNORMAL HIGH (ref 70–99)
Glucose-Capillary: 187 mg/dL — ABNORMAL HIGH (ref 70–99)

## 2011-02-22 ENCOUNTER — Encounter: Payer: Self-pay | Admitting: *Deleted

## 2011-02-22 ENCOUNTER — Encounter: Payer: Self-pay | Admitting: Cardiology

## 2011-02-23 ENCOUNTER — Encounter: Payer: Self-pay | Admitting: Cardiology

## 2011-02-23 ENCOUNTER — Ambulatory Visit (INDEPENDENT_AMBULATORY_CARE_PROVIDER_SITE_OTHER): Payer: Medicare Other | Admitting: Cardiology

## 2011-02-23 VITALS — BP 158/64 | HR 62 | Resp 18 | Ht 59.5 in | Wt 87.0 lb

## 2011-02-23 DIAGNOSIS — E785 Hyperlipidemia, unspecified: Secondary | ICD-10-CM

## 2011-02-23 DIAGNOSIS — Z720 Tobacco use: Secondary | ICD-10-CM

## 2011-02-23 DIAGNOSIS — I10 Essential (primary) hypertension: Secondary | ICD-10-CM

## 2011-02-23 DIAGNOSIS — I2510 Atherosclerotic heart disease of native coronary artery without angina pectoris: Secondary | ICD-10-CM

## 2011-02-23 DIAGNOSIS — F17200 Nicotine dependence, unspecified, uncomplicated: Secondary | ICD-10-CM

## 2011-02-23 DIAGNOSIS — I6529 Occlusion and stenosis of unspecified carotid artery: Secondary | ICD-10-CM

## 2011-02-23 MED ORDER — METOPROLOL TARTRATE 25 MG PO TABS: 100.0000 mg | ORAL_TABLET | Freq: Two times a day (BID) | ORAL | Status: DC

## 2011-02-23 MED ORDER — RAMIPRIL 10 MG PO CAPS: 10.0000 mg | ORAL_CAPSULE | Freq: Every day | ORAL | Status: DC

## 2011-02-23 MED ORDER — SIMVASTATIN 40 MG PO TABS: 10.0000 mg | ORAL_TABLET | Freq: Every day | ORAL | Status: DC

## 2011-02-23 MED ORDER — NIFEDIPINE ER 90 MG PO TB24: 90.0000 mg | ORAL_TABLET | Freq: Every day | ORAL | Status: DC

## 2011-02-23 MED ORDER — FUROSEMIDE 20 MG PO TABS: 20.0000 mg | ORAL_TABLET | Freq: Every day | ORAL | Status: DC

## 2011-02-23 MED ORDER — NITROGLYCERIN 0.4 MG SL SUBL: 0.4000 mg | SUBLINGUAL_TABLET | SUBLINGUAL | Status: DC | PRN

## 2011-02-23 NOTE — Assessment & Plan Note (Signed)
Her blood pressure is very slightly elevated. She will however continue meds as listed and she can keep a blood pressure diary.

## 2011-02-23 NOTE — Assessment & Plan Note (Signed)
He has a chronically occluded left carotid and 60-79% right. She is due for followup in October. She will continue risk reduction.

## 2011-02-23 NOTE — Assessment & Plan Note (Signed)
She continues to smoke a few cigarettes. I have prescribed complete abstinence.

## 2011-02-23 NOTE — Assessment & Plan Note (Signed)
Her lipids are followed elsewhere. In December her LDL was 63 with an HDL of 38. She will continue on the meds as listed.

## 2011-02-23 NOTE — Progress Notes (Signed)
HPI The patient presents for followup of her known coronary disease. She has carotid disease which we are actively following as well. Since I last saw her she has started noticing some shortness of breath and some slight chest discomfort particularly at night. She is not sure whether this is similar to a previous angina. She does not describe jaw or arm discomfort. It comes and goes spontaneously and is mild-to-moderate in intensity. She said some increased breathlessness with activity though she's not having PND or orthopnea. She has a dizziness but is not describing palpitations, presyncope or syncope. She did have a spell of vertigo in ER visit since I last saw her.  No Known Allergies  Current Outpatient Prescriptions  Medication Sig Dispense Refill  . aspirin 81 MG tablet Take 81 mg by mouth daily.        . Calcium Carbonate-Vitamin D (CALCIUM-VITAMIN D) 600-200 MG-UNIT CAPS Take by mouth.        . fish oil-omega-3 fatty acids 1000 MG capsule 1 tab po qd       . furosemide (LASIX) 20 MG tablet Take 20 mg by mouth daily.        . metFORMIN (GLUCOPHAGE) 1000 MG tablet 1/2 tab po bid       . metoprolol tartrate (LOPRESSOR) 25 MG tablet 100 mg. 1/2 tab morning and 1/2 tab po qhs      . Multiple Vitamin (MULTIVITAMIN) tablet Take 1 tablet by mouth daily.        Marland Kitchen NIFEdipine (ADALAT CC) 90 MG 24 hr tablet Take 90 mg by mouth daily.        . nitroGLYCERIN (NITROSTAT) 0.4 MG SL tablet Place 0.4 mg under the tongue every 5 (five) minutes as needed.        . Nutritional Supplements (MELATONIN PO) Take by mouth.        . ramipril (ALTACE) 10 MG capsule Take 10 mg by mouth daily.        . simvastatin (ZOCOR) 40 MG tablet Take 10 mg by mouth at bedtime.       . sitaGLIPtan (JANUVIA) 100 MG tablet Take 100 mg by mouth daily.        Marland Kitchen zolpidem (AMBIEN) 5 MG tablet Take 5 mg by mouth at bedtime as needed.          Past Medical History  Diagnosis Date  . CAD (coronary artery disease)   . Depression     . DM (diabetes mellitus)   . Hyperlipidemia   . Colon polyp   . Hyperplastic rectal polyp   . Ovarian cancer   . Cataract   . Bowel obstruction   . PVD (peripheral vascular disease)   . H/O: hysterectomy     Past Surgical History  Procedure Date  . Angioplasty     ROS:  As stated in the HPI and negative for all other systems.  PHYSICAL EXAM BP 158/64  Pulse 62  Resp 18  Ht 4' 11.5" (1.511 m)  Wt 87 lb (39.463 kg)  BMI 17.28 kg/m2 GENERAL:  Thin HEENT:  Pupils equal round and reactive, fundi not visualized, oral mucosa unremarkable NECK:  No jugular venous distention, waveform within normal limits, bilateral carotid bruit upstroke brisk and symmetric, no thyromegaly LYMPHATICS:  No cervical, inguinal adenopathy LUNGS:  Clear to auscultation bilaterally BACK:  No CVA tenderness CHEST:  Unremarkable HEART:  PMI not displaced or sustained,S1 and S2 within normal limits, no S3, no S4, no clicks, no rubs, no  murmurs ABD:  Flat, positive bowel sounds normal in frequency in pitch, no bruits, no rebound, no guarding, no midline pulsatile mass, no hepatomegaly, no splenomegaly EXT:  2 plus pulses throughout, no edema, no cyanosis no clubbing, femoral bruit. SKIN:  No rashes no nodules NEURO:  Cranial nerves II through XII grossly intact, motor grossly intact throughout PSYCH:  Cognitively intact, oriented to person place and time   EKG:  Sinus rhythm, rate 59, blood per voltage criteria, no acute ST-T wave changes  ASSESSMENT AND PLAN

## 2011-02-23 NOTE — Patient Instructions (Signed)
Continue current medications  You are being scheduled for a stress test.  Please refer to the instruction sheet provided

## 2011-02-23 NOTE — Assessment & Plan Note (Signed)
I am concerned that her symptoms might be an anginal length. It has been 7 years since her last catheterization. I will order an exercise perfusion study. She does think that she can walk.

## 2011-03-01 ENCOUNTER — Telehealth: Payer: Self-pay | Admitting: Cardiology

## 2011-03-01 DIAGNOSIS — I10 Essential (primary) hypertension: Secondary | ICD-10-CM

## 2011-03-01 NOTE — Telephone Encounter (Signed)
[facility] need clarificaton on metoprolol

## 2011-03-01 NOTE — Telephone Encounter (Signed)
Called  Representative at [facility] and changed Rx to Metoprolol Tartrate 100mg  1/2 tab am and 1/2 tab pm.  Change okay per Avie Arenas, RN  Judithe Modest, CMA/AAMA

## 2011-03-09 ENCOUNTER — Ambulatory Visit (HOSPITAL_COMMUNITY): Payer: Medicare Other | Attending: Cardiology | Admitting: Radiology

## 2011-03-09 VITALS — Ht 60.0 in | Wt 90.0 lb

## 2011-03-09 DIAGNOSIS — R079 Chest pain, unspecified: Secondary | ICD-10-CM

## 2011-03-09 DIAGNOSIS — I2510 Atherosclerotic heart disease of native coronary artery without angina pectoris: Secondary | ICD-10-CM | POA: Insufficient documentation

## 2011-03-09 DIAGNOSIS — I4949 Other premature depolarization: Secondary | ICD-10-CM

## 2011-03-09 MED ORDER — TECHNETIUM TC 99M TETROFOSMIN IV KIT
33.0000 | PACK | Freq: Once | INTRAVENOUS | Status: AC | PRN
  Administered 2011-03-09: 33 via INTRAVENOUS

## 2011-03-09 MED ORDER — TECHNETIUM TC 99M TETROFOSMIN IV KIT
11.0000 | PACK | Freq: Once | INTRAVENOUS | Status: AC | PRN
  Administered 2011-03-09: 11 via INTRAVENOUS

## 2011-03-09 NOTE — Progress Notes (Signed)
[facility] SITE 3 NUCLEAR MED [address] [place] [address] [phone]  Cardiology Nuclear Med Study  [name] is a 77 y.o. female [id number] 12/12/1924   Nuclear Med Background Indication for Stress Test:  Evaluation for Ischemia, PTCA/Stent Patency  History:  '92 IWMI, '03 PTC/Stent-RCA/mid LAD, '03 Echo:EF=45-50%, '04 MPS:no ischemia, EF=73%, '05 Stent-RCA, EF=65% Cardiac Risk Factors: Carotid Disease, Family History - CAD, Hypertension, Lipids, NIDDM, PVD, RBBB and Smoker  Symptoms:  Chest Pain (last episode of chest discomfort was about 2-weeks ago), Diaphoresis, DOE, Fatigue, Palpitations and Rapid HR   Nuclear Pre-Procedure Caffeine/Decaff Intake:  None NPO After: 7:00am   Lungs:  Clear. IV 0.9% NS with Angio Cath:  20g  IV Site: R Antecubital  IV Started by:  Stanton Kidney, EMT-P  Chest Size (in):  32 Cup Size: B  Height: 5' (1.524 m)  Weight:  90 lb (40.824 kg)  BMI:  Body mass index is 17.58 kg/(m^2). Tech Comments:  CBG=111 @ 7 am.  Metoprolol held > 15 hours, per patient    Nuclear Med Study 1 or 2 day study: 1 day  Stress Test Type:  Stress  Reading MD: Olga Millers, MD  Order Authorizing Provider:  [name], MD  Resting Radionuclide: Technetium 41m Tetrofosmin  Resting Radionuclide Dose: 11 mCi   Stress Radionuclide:  Technetium 43m Tetrofosmin  Stress Radionuclide Dose: 33 mCi           Stress Protocol Rest HR: 61 Stress HR: 130  Rest BP: 147/48 Stress BP: 191/57  Exercise Time (min): 6:00 METS: 7.0   Predicted Max HR: 137 bpm % Max HR: 94.89 bpm Rate Pressure Product: 24401   Dose of Adenosine (mg):  n/a Dose of Lexiscan: n/a mg  Dose of Atropine (mg): n/a Dose of Dobutamine: n/a mcg/kg/min (at max HR)  Stress Test Technologist: Smiley Houseman, CMA-N  Nuclear Technologist:  Domenic Polite, CNMT     Rest Procedure:  Myocardial perfusion imaging was performed at rest 45 minutes following the intravenous  administration of Technetium 9m Tetrofosmin.  Rest ECG: RBBB  Stress Procedure:  The patient exercised for six minutes on the treadmill utilizing the Bruce protocol.  The patient stopped due to fatigue and denied any chest pain.  There were no diagnostic ST-T wave changes, only occasional PVC's.  Technetium 64m Tetrofosmin was injected at peak exercise and myocardial perfusion imaging was performed after a brief delay.  Stress ECG: No significant ST segment change suggestive of ischemia.  QPS Raw Data Images:  Acquisition technically good; normal left ventricular size. Stress Images:  There is decreased uptake in the inferior wall. Rest Images:  There is decreased uptake in the inferior wall, slightly less prominent compared to the stress images. Subtraction (SDS):  These findings are consistent with small prior inferior infarct with minimal per-infarct ischemia. Transient Ischemic Dilatation (Normal <1.22):  .94  Lung/Heart Ratio (Normal <0.45):  .21  Quantitative Gated Spect Images QGS EDV:  52 ml QGS ESV:  16 ml QGS cine images:  NL LV Function; NL Wall Motion QGS EF: 69%  Impression Exercise Capacity:  Fair exercise capacity. BP Response:  Normal blood pressure response. Clinical Symptoms:  No chest pain. ECG Impression:  No significant ST segment change suggestive of ischemia. Comparison with Prior Nuclear Study: Inferior ischemia less pronounced compared to study of 04/21/02.  Overall Impression:  Abnormal stress nuclear study with small prior inferior infarct and minimal peri-infarct ischemia.   Olga Millers

## 2011-03-10 NOTE — Progress Notes (Addendum)
ROUTED TO DR. HOCHREIN  Low risk study.  Continue current medical therapy.  [name]

## 2011-03-14 NOTE — Assessment & Plan Note (Signed)
[facility]                            CARDIOLOGY OFFICE NOTE   NAME:[name], [name]                     MRN:          [id number]  DATE:12/28/2008                            DOB:          [birth year]    PRIMARY CARE PHYSICIAN:  Tinnie Gens A. Tawanna Cooler, MD   REASON FOR PRESENTATION:  Evaluate the patient with coronary artery  disease.   HISTORY OF PRESENT ILLNESS:  The patient is now 77 years old.  She  presents for yearly followup.  She has done well since I last saw her.  She is still having some trouble with her diabetes.  Dr. Leslie Dales is  helping with this.  Her hemoglobin A1c was 7.  She reports her lipids  were well controlled, though I do not have any copies since June of last  year at which point her LDL was 58, but her HDL was still low at 33.  She has had no chest discomfort, neck, or arm discomfort.  She has had  no palpitations, presyncope, or syncope.  She had no PND or orthopnea.  She does her household chores, but does not exercise routinely.   PAST MEDICAL HISTORY:  1. Coronary artery disease (see January 03, 2008, note in details).  2. Peripheral vascular disease.  3. Chronically occluded left internal carotid artery with recurrent      disease of the right.  4. Status post right carotid endarterectomy in 2000.  5. Diabetes mellitus.  6. Hyperlipidemia.  7. Hypertension.  8. Left subclavian stenosis.   ALLERGIES:  None.   MEDICATIONS:  1. Omega 3.  2. Aspirin 81 mg daily.  3. Januvia.  4. Amitriptyline.  5. Metformin 500 mg b.i.d.  6. Fish oil.  7. Simvastatin 40 mg daily.  8. Calcium.  9. Multivitamin.  10.Furosemide 20 mg daily.  11.Nifedipine 90 mg daily.  12.Toprol 50 mg b.i.d.  13.Altace 10 mg daily.   REVIEW OF SYSTEMS:  As stated in the HPI and otherwise negative for  other systems.   PHYSICAL EXAMINATION:  GENERAL:  The patient is in no distress.  VITAL SIGNS:  Blood pressure 118/50, heart rate 56 and regular,  weight  94, and body mass index 20.  NECK:  No jugular venous distention at 45 degrees.  Carotid upstroke  brisk and symmetric.  Bilateral carotid bruits.  Carotid endarterectomy  scar in the right.  LYMPHATICS:  No adenopathy.  LUNGS:  Clear to auscultation bilaterally.  CHEST:  Unremarkable.  HEART:  PMI not displaced or sustained.  S1 and S2 within normal limits.  No S3, no S4.  A 2/6 apical systolic murmur radiating briefly at the  aortic outflow tract.  No diastolic murmurs.  ABDOMEN:  Flat, positive bowel sounds, normal in frequency and pitch.  No bruits, no rebound, no guarding.  No midline pulsatile mass.  No  organomegaly.  SKIN:  No rashes, no nodules.  EXTREMITIES:  Pulses 2+.  No edema.   EKG, sinus bradycardia, rate 56, right bundle-branch block, left atrial  enlargement, no acute ST-T wave changes.   ASSESSMENT AND PLAN:  1. Coronary  artery disease.  The patient is having no new symptoms.      No further cardiovascular testing is suggested.  She will continue      with the risk reduction.  2. Peripheral vascular disease.  She is to have her carotids checked      again in October and we will make sure this happens.  3. Dyslipidemia.  I will review this with her.  I do not have her most      recent labs but would suggest the goal to be an LDL less than 70      and HDL greater than 50.  I would be happy to review these.  I told      her that walking will increase her HDL and she is very reluctant to      start any new medications for this.  4. Hypertension.  Blood pressure is well controlled and she will      continue the meds as listed.  5. Followup.  I will see her back in 1 year or sooner if needed.     [name], MD, [facility]  Electronically Signed    JH/MedQ  DD: 12/28/2008  DT: 12/28/2008  Job #: [id number]   cc:   Tinnie Gens A. Tawanna Cooler, MD

## 2011-03-14 NOTE — H&P (Signed)
[name], [name] NO.:  [id number]   MEDICAL RECORD NO.:  [id number]          PATIENT TYPE:  INP   LOCATION:  3733                         FACILITY:  [facility]   PHYSICIAN:  Donalynn Furlong, MD      DATE OF BIRTH:  07-Aug-1925   DATE OF ADMISSION:  05/28/2009  DATE OF DISCHARGE:                              HISTORY & PHYSICAL   ADMITTING PHYSICIAN:  [facility].   PRIMARY CARE Sheniqua Carolan:  Eugenio Hoes. Tawanna Cooler, M.D.   CHIEF COMPLAINT:  Nausea, vomiting, abdominal pain.   HISTORY OF PRESENT ILLNESS:  [name] is an 77 year old Caucasian  female who lives by herself in [place].  She presented to [facility] today.  She started having episode of nausea, vomiting, also  associated with crampy abdominal pain diffusely located in the abdomen.  Her abdominal pain, nausea, and vomiting started yesterday at night.  Her vomiting is once an hour.  She could not sleep last night due to her  constant nausea, vomiting and abdominal pain.  This morning she  continued to have nausea, vomiting, that is why she presented to [facility] ED.  She was unable to stabilize in the ED without resolution of  nausea, vomiting and that is why she was getting admitted for the  further workup.  She denied any fever, chills, headache, eye or ear  symptoms.  She complained of dryness in her mouth, and she has been  feeling dehydrated also.  She has generalized fatigue at this time.  She  lives by herself.  She denied any diarrhea.  She has 1 bowel movement  since the start of her symptoms.  She also mentions that she is passing  gas.  She denies any history of intestinal obstruction in the past.  She  has a history of abdominal surgery, including removal of ovarian cyst  and uterus in the past for ovarian cancer.  She did have a history of  appendectomy in the past.  She does have gallbladder.  She denies any  history of gallstone at this time.  She denies any history of urinary  complaints or leg swelling.  The patient denies any chest pain,  shortness of breath, cough or sputum production.  She does have a  history of coronary artery disease with 2 stent placements in the past.   PAST MEDICAL HISTORY:  Diabetes mellitus, colon polyp, peripheral  vascular disease, right carotid endarterectomy in 2000, hyperlipidemia,  hypertension, left subclavian artery stenosis, poorly differentiated  endometrioid adenocarcinoma of ovary, status post surgical resection,  chemotherapy, radiation in the past.  Coronary artery disease, status  post 2 stent placements in December 16, 2001.  Depression, appendectomy,  tonsillectomy and a of breast cancer in her aunt.   PAST SURGICAL HISTORY:  As per past medical history.   HOME MEDICATIONS:  List is unavailable at this time.   ALLERGIES:  None.   Family history of breast cancer in her aunt.   SOCIAL HISTORY:  She lives by herself.  She continues to smoke  cigarettes.  She denies any alcohol or  illicit drug use at this time.   PHYSICAL EXAMINATION:  VITAL SIGNS:  Blood pressure 188/69, pulse 77,  respiration 13, temperature 98.6.  GENERAL:  Alert, oriented x3 laying in bed in mild distress.  CARDIOVASCULAR:  S1 and S2.  No murmur or gallop.  LUNGS:  Clear to auscultation bilaterally.  No wheezing or crackles.  ABDOMEN:  Very minimal tenderness throughout the abdomen and no deep  tenderness or rigidity or guarding noted.  No hepatosplenomegaly.  Bowel  sounds are present.  EXTREMITIES:  No clubbing, cyanosis or edema.  Pulses palpable in all 4  extremities.  Head normocephalic nontraumatic.  Pupils are equal and reactive to light  and accommodation.  Extraocular muscles are intact.  Oral cavity:  Oral  mucosa are moist.  No thrush noted.  NECK:  No thyromegaly or JVD.  SKIN:  No rash or bruits.  NEUROLOGICAL:  Exam shows intact cranial nerves, musculature, sensation  and reflexes.   LABS:  CT scan of abdomen and pelvis  shows a large amount of stool  throughout the colon with no acute findings.  She does have a heavily  calcified mild aneurysmal aorta.  Mildly distended stomach and duodenum  with caliber change at the ligament of Treitz.  Cannot completely  exclude a partially obstructive lesion at this level.  An atrophic  pancreas.   Lipase 19, amylase 62.  Urinalysis does not show any urinary tract  infection.  Comprehensive metabolic panel unremarkable except glucose  185, GFR 58.  CBC with differential shows WBC 13.1.   ASSESSMENT AND PLAN:  1. Acute onset with moderate-to-severe vomiting and crampy abdominal      pain.  The patient with a history of coronary artery disease,      ovarian cancer with possible differential includes acute      gastroenteritis and intestinal obstruction versus biliary in      origin.  2. History of weight loss with diarrhea in the last 6 months.  3. History of diabetes mellitus.  4. History of coronary disease.  5. History of hypertension.  6. History of hyperlipidemia.  7. History of depression.  8. History of appendectomy and ovarian cystectomy in the past.   PLAN:  Will admit the patient on a telemetry bed under [facility]  with a diagnosis of nausea, vomiting, abdominal pain.  The patient is a  full code.  The patient will be n.p.o. except medicines, ice chips and  water.  Recheck vitals and input/output every 8 hours.  Will get CBC and  CMP in the morning.  We will start IV normal saline at 75 mL per hour  for 2 liters for hydration.  Will try ondansetron and Phenergan p.r.n.  for nausea, vomiting.  Will provide IV morphine p.r.n. for pain.  Will  provide p.o. Ambien tonight for sleep.  We will start sliding scale  insulin with scale 1 a.c. and h.s. Provide NitroQuick 0.4 mg sublingual  p.r.n. for chest pain.  Will consider doing surgical consult based on CT  scan findings tonight for further plan according to the workup pending.      Donalynn Furlong, MD  Electronically Signed    TVP/MEDQ  D:  05/28/2009  T:  05/28/2009  Job:  [id number]   cc:   Tinnie Gens A. Tawanna Cooler, MD

## 2011-03-14 NOTE — Consult Note (Signed)
[name], LACEK NO.:  [id number]   MEDICAL RECORD NO.:  [id number]          PATIENT TYPE:  OUT   LOCATION:  GYN                          [facility]ACILITY[facility]  [facility]   PHYSICIAN:  De Blanch, M.D.DATE OF BIRTH:  1925-05-01   DATE OF CONSULTATION:  DATE OF DISCHARGE:                                 CONSULTATION   CHIEF COMPLAINT:  Ovarian cancer.   INTERVAL HISTORY:  The patient presents today for continuing followup of  ovarian cancer.  Since her last visit, she has seen Dr. Darrold Span.  She  denies any GI or GU symptoms, has no pelvic pain/pressure, vaginal  bleeding or discharge.  Functional status has been excellent.  CA-125  value on February 27, 2007, was 14 units/ml and this is stable compared to  other values dating back for over four years.   HISTORY OF PRESENT ILLNESS:  The patient underwent surgical resection  and staging in April 2004.  She was found to have a stage IA grade 3  ovarian cancer.  She subsequently received three cycles of adjuvant  carboplatin and Taxol chemotherapy.  She has been followed since that  time with no evidence of recurrent disease.   PAST MEDICAL HISTORY:   MEDICAL ILLNESSES:  1. Coronary artery disease.  2. Peripheral vascular disease.  3. Diabetes.   CURRENT MEDICATIONS:  Altace and ibuprofen.   PAST SURGICAL HISTORY:  1. Angioplasty.  2. Carotid endarterectomy.  3. Ovarian cystectomy.  4. Tonsils and adenoidectomy.  5. Appendectomy.  6. Ovarian cancer resection staging in 2004.  7. Coronary artery stent placement in 2005.   OBSTETRICAL HISTORY:  Gravida 2.   FAMILY HISTORY:  Breast cancer in one aunt.  There is no other family  history of ovarian, colon, or uterine cancers.   DRUG ALLERGIES:  None.   REVIEW OF SYSTEMS:  A 10-point comprehensive review of systems is  negative except as noted above.   PHYSICAL EXAMINATION:  VITAL SIGNS:  Weight 118 pounds, blood pressure  140/50.  GENERAL:  The patient  is a healthy white female, no acute distress.  HEENT:  Negative.  NECK:  Supple without thyromegaly.  LYMPHATIC:  There is no supraclavicular or inguinal adenopathy.  ABDOMEN:  Soft, nontender.  No mass, organomegaly, ascites, or hernias  noted.  PELVIC:  EG/BUS, vagina, bladder, urethra are normal.  Cervix and uterus  are surgically absent.  Adnexa without masses.  Rectovaginal exam  confirms.   LABORATORY:  Reviewed.   IMPRESSION:  Stage IA grade 3 ovarian cancer, no evidence of recurrent  disease, now with four years of followup.   PLAN:  The patient will return to see Dr. Darrold Span in 6 months and return  to see [place] in one year.      De Blanch, M.D.  Electronically Signed     DC/MEDQ  D:  03/22/2007  T:  03/22/2007  Job:  [id number]   cc:   Lennis P. Darrold Span, M.D.  Fax: [phone]   Telford Nab, R.N.  [address]  [place], [place] [address]   Carrington Clamp, M.D.  Fax: [phone]  Jeffrey A. Tawanna Cooler, MD  [address] [place]  [place] [address]   Salvadore Farber, MD  [address]  [address]  [place]  [place] [address]

## 2011-03-14 NOTE — Assessment & Plan Note (Signed)
[facility]                            CARDIOLOGY OFFICE NOTE   NAME:[name], [name]                     MRN:          [id number]  DATE:01/03/2008                            DOB:          [birth year]    PRIMARY CARE PHYSICIAN:  Tinnie Gens A. Tawanna Cooler, MD.   REASON FOR PRESENTATION:  Evaluate patient with coronary artery disease.   HISTORY OF PRESENT ILLNESS:  The patient is a pleasant 77 year old white  female with a history of coronary disease as described below.  She has  been followed by Dr. Samule Ohm yearly.  She has not had any new problems.  She has done well since she was last here.  She does not exercise  routinely.  However, with full activities as walking through the grocery  store, she does not get any chest, neck or arm discomfort.  She has had  no palpitation, presyncope, syncope.  She has had no PND or orthopnea.  She has her lipids and diabetes followed closely by Dr. Tawanna Cooler.   PAST MEDICAL HISTORY:  1. Coronary artery disease (to seize inferior myocardial infarction      2002, treated with streptokinase.  Cath showed a 50% right coronary      artery stenosis at that time.  The patient had a catheterization in      2002 demonstrated 75% RCA stenosis, long 60% proximal RCA stenosis,      50% mid RCA stenosis, 90% mid-LAD stenosis.  Dr. Juanda Chance performed      stenting of the RCA in the LAD.  Last catheterization was in 2005      demonstrating a 95% in-stent restenosis in the right coronary      artery.  This was treated by Dr. Juanda Chance with a Taxus drug-eluting      stent).  2. Peripheral vascular disease.  3. Chronically occluded left internal carotid with recurrent disease      of the right.  4. Status post right carotid endarterectomy in 2000.  5. Diabetes mellitus.  6. Hypertension.  7. Dyslipidemia.  8. Left subclavian stenosis.   ALLERGIES:  None.   MEDICATIONS:  1. Altace 10 mg daily.  2. Metoprolol 50 mg b.i.d.  3. Nifedipine 90 mg  daily.  4. Furosemide 20 mg daily.  5. Aspirin 325 mg daily.  6. Multivitamin.  7. Calcium.  8. Simvastatin 40 mg daily.  9. Glipizide 10 mg b.i.d.  10.Amitriptyline.  11.Fish oil.  12.Metformin 500 mg b.i.d..   REVIEW OF SYSTEMS:  As stated in the HPI, otherwise negative for other  systems.   PHYSICAL EXAMINATION:  GENERAL:  The patient is in no distress.  VITAL SIGNS:  Blood pressure 138/52, heart rate 60 and regular.  HEENT:  Eyes unremarkable, pupils equal, round and reactive.  Fundi not  visualized.  NECK:  Bilateral carotid bruits, right carotid endarterectomy scar.  LUNGS:  Clear to auscultation bilaterally.  HEART:  PMI not displaced or sustained, S1-S2 within normal so no S3, no  S4, 2/6 apical systolic murmur briefly radiating out the aortic outflow  tract, no diastolic murmur.  ABDOMEN:  Flat,  positive bowel sounds normal in frequency and pitch, no  bruits, rebound, guarding, no midline pulsatile mass, organomegaly.  SKIN:  Rash.  EXTREMITIES:  Pulse 2+, no edema.   STUDIES:  EKG sinus rhythm, rate 66, right bundle branch block, no acute  ST-wave changes.   ASSESSMENT/PLAN:  1. Coronary disease.  Patient is having no new symptoms.  No further      cardiovascular testing is suggested.  She will continue with risk      reduction per Dr. Tawanna Cooler.  2. Peripheral vascular disease.  She is due to have carotid Dopplers      in October to follow up the right moderate obstruction.  3. Risk reduction.  She needs to walk more and we discussed this a      length.  She owns a treadmill and so could do this.  4. Hypertension.  Blood pressure is under good control.  She will      continue with medications as listed.  5. Follow-up:  I will see her back yearly or sooner if needed.     [name], MD, [facility]  Electronically Signed    JH/MedQ  DD: 01/03/2008  DT: 01/05/2008  Job #: [id number]   cc:   Tinnie Gens A. Tawanna Cooler, MD

## 2011-03-14 NOTE — Discharge Summary (Signed)
[name], KELM NO.:  [id number]   MEDICAL RECORD NO.:  [id number]          PATIENT TYPE:  INP   LOCATION:  3739                         FACILITY:  [facility]   PHYSICIAN:  Lonia Blood, M.D.       DATE OF BIRTH:  05/07/1925   DATE OF ADMISSION:  05/28/2009  DATE OF DISCHARGE:  06/02/2009                               DISCHARGE SUMMARY   PRIMARY CARE PHYSICIAN:  Tinnie Gens A. Tawanna Cooler, MD   DISCHARGE DIAGNOSES:  1. Nausea, vomiting and severe constipation resolved.  2. Coronary artery disease.  3. Carotid artery stenosis and status post right carotid      endarterectomy.  4. Diabetes mellitus.  5. Hyperlipidemia.  6. Status post appendectomy.  7. Hypokalemia secondary to diuretics, resolved.  8. Anemia of unclear etiology outpatient followup required.  9. History of ovarian cancer.   DISCHARGE MEDICATIONS:  1. Lasix 20 mg daily.  2. Potassium chloride 10 mEq daily.  3. Metformin 500 mg twice a day.  4. Metoprolol 100 mg twice a day.  5. Ramipril 10 mg daily.  6. Simvastatin 40 mg daily.  7. Januvia 100 mg daily.  8. Aspirin 325 mg daily.  9. Calcium and vitamin D daily.  10.Fish oil 3000 mg daily.  11.Sorbitol 30 mL daily as needed for constipation.  12.Multivitamin daily.  13.Nu-Iron 160 mg daily.   CONDITION ON DISCHARGE:  [name] is discharged in good condition,  afebrile with stable vital signs, alert, oriented, able to tolerate a  regular diet without any abdominal pain.  She will follow up with Dr.  Kelle Darting within a week and she was instructed to call for  appointment.   PROCEDURE DURING THIS ADMISSION:  The patient underwent CT scan abdomen  and pelvis with findings of constipation.   CONSULTATION:  No consultation was obtained.   HISTORY AND PHYSICAL:  Refer to the dictated H&P done by Dr. Allena Katz on  May 28, 2009   HOSPITAL COURSE:  [name] is an 77 year old woman with multiple  medical problems who presented with severe nausea,  vomiting, and  abdominal pain.  It became apparent that all her problems are related to  severe constipation for which we were able to successfully treat her  eventually by using high doses of sorbitol.  The patient was tolerating  regular diet and her abdominal complaints resolved completely by the  time of the discharge.   During this hospitalization, [name] had an episode of  supraventricular tachycardia which we think it was precipitated by the  fact that the patient was n.p.o., having hypokalemia and without her  high dose metoprolol that she was usually taking.Once the potassium was  repleted, the patient was hydrated and the metoprolol resumed, she did  not have any recurrent tachycardia.  During this hospitalization, we  also noted that the patient was having mild anemia.  An anemia panel  indicated possible anemia of chronic disease, so at this point in time  we just recommended multivitamin, iron and close outpatient followup.  [name] was not noted to have any overt bleeding from anywhere  during  this admission.   [name] reported history of diarrhea prior to this admission.  Based  on the findings of the current evaluation we think that diarrhea was  probably due to fecal impaction.  We suggested discontinuation of  amitriptyline and nifedipine as not to promote further severe impaction  in the future.      Lonia Blood, M.D.  Electronically Signed     SL/MEDQ  D:  06/02/2009  T:  06/03/2009  Job:  [id number]   cc:   Tinnie Gens A. Tawanna Cooler, MD

## 2011-03-14 NOTE — Consult Note (Signed)
NAMEMARVIS, Beard NO.:  [id number]   MEDICAL RECORD NO.:  [id number]          PATIENT TYPE:  OUT   LOCATION:  GYN                          FACILITY[facility]  [facility]   PHYSICIAN:  De Blanch, M.D.DATE OF BIRTH:  [birth year]   DATE OF CONSULTATION:  03/04/2008  DATE OF DISCHARGE:                                 CONSULTATION   CHIEF COMPLAINT:  Ovarian cancer, diarrhea.   INTERVAL HISTORY:  Patient reports that she was doing well until  approximately two weeks ago when she developed diarrhea, resulting in  several bouts of liquid stool on a daily basis.  She has been working  with her pharmacist, using a number of anti-diarrheal medications.  She  is uncertain as to the medicine she is using at the present time.  Overall, she feels very fatigued.  She denies any fevers or chills.  She  has not been on any antibiotics.   HISTORY OF PRESENT ILLNESS:  Patient underwent a surgical resection and  staging in April, 2004.  She was found to have a stage IA, grade 3  ovarian cancer.  She received three cycles of adjuvant carboplatin and  Taxol chemotherapy.  She has been followed since that time with no  evidence of recurrent disease.   PAST MEDICAL HISTORY/MEDICAL ILLNESSES:  1. Coronary artery disease.  2. Peripheral vascular disease.  3. Diabetes.   CURRENT MEDICATIONS:  Altace, ibuprofen.   PAST SURGICAL HISTORY:  1. Angioplasty.  2. Carotid endarterectomy.  3. Ovarian cystectomy.  4. Tonsillectomy and adenoidectomy.  5. Appendectomy.  6. Ovarian cancer resection and staging in 2004.  7. Coronary stent placement in 2005.   OBSTETRICAL HISTORY:  Gravida 2.   FAMILY HISTORY:  Breast cancer in one aunt.   DRUG ALLERGIES:  None.   REVIEW OF SYSTEMS:  A 10-point comprehensive review of systems is  negative, except as noted above.   PHYSICAL EXAMINATION:  Weight 110 pounds (down 8 pounds since our visit  a year ago).  GENERAL:  Patient is a slender,  petite white female in no acute  distress, although she appears fatigued.  HEENT:  Negative.  NECK:  Supple without thyromegaly.  There is no supraclavicular or  inguinal adenopathy.  ABDOMEN:  Soft and nontender.  She has normal bowel sounds.  No masses,  organomegaly, or ascites are noted.  PELVIC:  EG/BUS, vagina, bladder, and urethra are normal.  The cervix  and uterus are surgically absent.  Adnexa are without masses.  Rectovaginal exam is deferred because of her diarrhea.  EXTREMITIES:  Lower extremities are without edema and varicosities.   IMPRESSION:  1. Stage IA, grade 3 ovarian cancer with no evidence of recurrent      disease.  CA-125 was obtained on May 1.  It was 16 U/ml (stable).  2. Diarrhea of questionable etiology.   We have arranged for her to be seen by her primary care physician, Dr.  Kelle Darting, tomorrow.  She will return to see Dr. Darrold Span in six  months and [place] in one year for continuing followup.      De Blanch,  M.D.  Electronically Signed     DC/MEDQ  D:  03/04/2008  T:  03/04/2008  Job:  [id number]   cc:   Lennis P. Darrold Span, M.D.  Fax: [phone]   Eugenio Hoes. Tawanna Cooler, MD  [address] [place]  [place] [address]   Telford Nab, R.N.  [address]  [place], [place] [address]   [name], MD, [facility]  [address]  [address]  [place]  [place] [address]

## 2011-03-16 NOTE — Progress Notes (Signed)
Pt aware to continue current therapy

## 2011-03-17 NOTE — Consult Note (Signed)
NAMEJADI, DEYARMIN NO.:  [id number]   MEDICAL RECORD NO.:  [id number]          PATIENT TYPE:  OUT   LOCATION:  GYN                          FACILITY:  [facility]   PHYSICIAN:  De Blanch, M.D.DATE OF BIRTH:  13-Nov-1924   DATE OF CONSULTATION:  03/09/2006  DATE OF DISCHARGE:                                   CONSULTATION   CHIEF COMPLAINT:  Ovarian cancer.   INTERVAL HISTORY:  Since her last visit, the patient has done well.  She  denies any GI or GU symptoms, has no pelvic pain, pressure, vaginal bleeding  or discharge.  Functional status is excellent.  She has been more diligent  with regard to her diet in hopes of controlling her diabetes and has lost  some weight.  She denies any GI or GU symptoms.   HISTORY OF PRESENT ILLNESS:  The patient has stage IA, grade 3 ovarian  cancer undergoing initial surgical resection and staging April 2004.  She  received three cycles of carboplatin and Taxol chemotherapy subsequent to  her surgery.  She has had no evidence of recurrent disease.  A recent CA-125  value was 14.3 units/mL (February 20, 2006).   PAST MEDICAL HISTORY/MEDICAL ILLNESSES:  1.  Coronary artery disease.  2.  Diabetes.  3.  Peripheral vascular disease.   PAST SURGICAL HISTORY:  Angioplasty, carotid endarterectomy, ovarian  cystectomy, tonsils and adenoidectomy, appendectomy, ovarian cancer  resection and staging 2004, coronary artery stent placement 2005.   OBSTETRICAL HISTORY:  Gravida 2.   FAMILY HISTORY:  Aunt with breast cancer.   ALLERGIES:  None.   REVIEW OF SYSTEMS:  A 10-point comprehensive review of systems negative  except as noted above.   PHYSICAL EXAMINATION:  VITAL SIGNS:  Weight 121 pounds, blood pressure  140/60, pulse 80, respiratory rate 20.  GENERAL:  The patient is a healthy white female, no acute distress.  HEENT:  Negative.  NECK:  Supple without thyromegaly, there is no supraclavicular or inguinal  adenopathy.  ABDOMEN:  Soft, nontender.  No masses, organomegaly, ascites, or hernias are  noted.  PELVIC:  BUS, vagina, and __________  were normal.  Cervix and uterus  surgically absent.  Adnexa without masses.  Rectovaginal exam confirms.  EXTREMITIES:  Lower extremities without edema or varicosities.   IMPRESSION:  Stage IA, grade 3 ovarian cancer, April 2004, no evidence of  recurrent disease.   PLAN:  The patient will return to see Dr. Darrold Span in six months and return  to see [place] in one year for surveillance.  We will continue to monitor CA-125  each visit.      De Blanch, M.D.  Electronically Signed     DC/MEDQ  D:  03/09/2006  T:  03/10/2006  Job:  [id number]   cc:   Lennis P. Darrold Span, M.D.  Fax: [phone]   Telford Nab, R.N.  [address]  [place], [place] [address]   Carrington Clamp, M.D.  Fax: [phone]   Eugenio Hoes. Tawanna Cooler, M.D. [facility]  [address] [place]  [place] [address]   Salvadore Farber, M.D. [facility]  [address]  [address]  [place]  [place] [address]

## 2011-03-17 NOTE — Discharge Summary (Signed)
NAMELAVAUN, GREENFIELD NO.:  [id number]   MEDICAL RECORD NO.:  [id number]                   PATIENT TYPE:  INP   LOCATION:  3715                                 FACILITY:  [facility]   PHYSICIAN:  Lavella Hammock, PA [facility]              DATE OF BIRTH:  10-23-1925   DATE OF ADMISSION:  06/18/2002  DATE OF DISCHARGE:  06/21/2002                           DISCHARGE SUMMARY - REFERRING   PROCEDURE:  1. Cardiac catheterization.  2. Coronary arteriogram.  3. Left ventriculogram.  4. Percutaneous transluminal coronary angioplasty and stent of one vessel     with PTCA of the second vessel.   HOSPITAL COURSE:  The patient is a 77 year old female with known history of  coronary artery disease.  She had a stent to her LAD and RCA in January of  2003.  She had an Adenosine-Cardiolite in June because of recurrence of  symptoms and it showed inferior ischemia.  She had postponed the procedure  until August because of other medical problems going on.  She was evaluated  in the office on June 09, 2002 and scheduled for admission and  catheterization on June 18, 2002.   The patient had cardiac catheterization on June 18, 2002 showing a normal  left main, 30% distal stenosis, and an LAD with 70% in-stent restenosis.  Her first diagonal had a 60% stenosis.  The circumflex had a 40% stenosis  and RCA had 99% in-stent restenosis with LAD to PDA collaterals.  Her EF was  60% with severe posterolateral hypokinesis.   She was started on aspirin and Plavix and scheduled for percutaneous  intervention with brachytherapy.  She had PTCA with cutting balloon  angioplasty and brachytherapy to the mid-LAD as well as balloon angioplasty  to the proximal RCA on June 19, 2002.  The stenosis in the LAD was reduced  to 0 with TIMI III flow.  In the RCA, there was difficulty passing the taxis  stent despite attempts with the cutting wire.  She had chest pain and went  into  ventricular fibrillation.  She was stopped twice without changes in  rhythm and then defibrillated with 200 joules with restoration of normal  sinus rhythm.  The PTCA reduced the RCA to 0 with TIMI III flow.  No further  attempts were made to deliver a taxis stent.   She was placed on Integrilin for 18 hours and Plavix for six months.  She is  to be on aspirin indefinitely.  The procedure and the resulting  defibrillation were discussed at length with the patient who it was felt  would not have recurrence of dysrhythmia without recurrent stenosis as her  EF was greater than 40%.  No further evaluation was needed at this time.   The next day, she was ambulating well and had no chest pain or shortness of  breath.  She was seen by cardiac rehabilitation and ambulated well with  them.  The patient is willing to be referred to diabetic classes and  possibly cardiac rehabilitation if Dr. Salvadore Farber feels that a  diagnosis of unstable angina is indicated.   By June 21, 2002, she was ambulating without chest pain without any  further arrhythmias.  She was considered stable for discharge on June 21, 2002.   LABORATORY DATA:  Chest x-ray:  Heart size was within normal limits.  There  is a tortuous and heavily calcified aorta with also left ventricular  calcification a possibility.   Hemoglobin 10.9, hematocrit 32.3, wbc's 11.3, platelets 209,000.  Sodium  135, potassium 4.0, chloride 106, CO2 23, BUN 14, creatinine 0.7, glucose  127.  Postprocedure CK-MB negative.  Total cholesterol 128, triglycerides  180, HDL 32, LDL 60.   CONDITION ON DISCHARGE:  Improved.   DISCHARGE DIAGNOSES:  1. Chest pain secondary to coronary artery disease, status post percutaneous     transluminal coronary angioplasty and stent with brachytherapy to the mid-     left anterior descending artery and percutaneous transluminal coronary     angioplasty to the right coronary artery, unsuccessful deployment  of     taxis stent.  2. Preserved left ventricular function with an ejection fraction of 60% and     severe posterobasilar hypokinesis.  3. Ventricular fibrillation/arrest during attempt of insertion of a stent     into the right coronary artery.  4. Dyslipidemia with hypertriglyceridemia found on continued Zocor.  5. Leukocytosis, probably stress reaction.  6. Normocytic anemia, postprocedure.  Follow up with primary care physician.  7. Hypertension.  8. Status post percutaneous transluminal coronary angioplasty and stent to     the left anterior descending artery and right coronary artery January     2003.  9. Noninsulin-dependent diabetes mellitus.  10.      Status post right carotid endarterectomy in 2000.  11.      Family history of coronary artery disease.  12.      Status post removal of an ovarian cyst.  13.      Status post elbow surgery.  14.      History of myocardial infarction in 1993.  15.      Status post tonsillectomy as well as appendectomy.  16.      Status post cataract surgery.   DISCHARGE INSTRUCTIONS:  Her activity level is to include no driving for two  days and no sexual or strenuous activity for a week.  She is to stick to a  low fat, diabetic diet.  She is to call the office for problems with  catheterization site.  She is to follow up with Dr. Salvadore Farber and  call for an appointment.  She is to follow up with Dr. Tinnie Gens A. Todd and  call for an appointment as well.   DISCHARGE MEDICATIONS:  1. Plavix 75 mg q.d. for 6 months.  2. Metoprolol 100 mg 1/2 tablets b.i.d.  3. Procardia 90 mg q.d.  4. Glucotrol XL 10 mg a.m. and 5 mg p.m.  5. Elavil 25 mg q.h.s.  6. Zocor 40 mg 1/2 tablet q.d.  7. Coated aspirin 325 mg q.d.  8.     Lasix 20 mg q.d.  9. Clonidine is on hold for now.  10.      Nitroglycerin p.r.n.  Lavella Hammock, PA [facility]   RG/MEDQ  D:  06/21/2002  T:  06/23/2002  Job:  [id number]   cc:    Salvadore Farber, MD [facility]  [address] [place], [place] [address]  Fax: 1   Eugenio Hoes. Tawanna Cooler, M.D. [facility]

## 2011-03-17 NOTE — Consult Note (Signed)
   NAME:  [name], ADORNO NO.:  [id number]   MEDICAL RECORD NO.:  [id number]                   PATIENT TYPE:  OUT   LOCATION:  GYN                                  FACILITY:  [facility]   PHYSICIAN:  De Blanch, M.D.         DATE OF BIRTH:  Nov 21, 1924   DATE OF CONSULTATION:  02/25/2003  DATE OF DISCHARGE:                                   CONSULTATION   This 77 year old white female returns for postoperative check and treatment  planing.  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  The tumor is confined to the right ovary, she underwent  extensive surgical staging including pelvic and periaortic lymphadenectomy,  omentectomy and peritoneal biopsies as well as peritoneal washings.  Final  stage was a stage a stage 1A grade 3.  The patient has had an uncomplicated  postoperative course, her energy level was improving daily.  She has minimal  discomfort in her wound.   I had a lengthy consultation with the patient and her daughter regarding  pathology findings.  Given the poorly differentiated nature of her lesion it  was recommended she receive adjuvant therapy.  Currently the [facility] feels that  the standard of care is three cycles of carboplatin, Taxol in early stage  ovarian cancer patients.  I therefore recommend she have three cycles of  carboplatin and Taxol.  The risks and expected side effects were outlined to  the patient and her daughter, all other questions were answered and she will  see a medical oncologist in the very near future to begin treatment  planning.   From a surgical point of view the patient will return to see me for a six  weeks postoperative check up.                                               De Blanch, M.D.    DC/MEDQ  D:  02/25/2003  T:  02/25/2003  Job:  [id number]   cc:   Carrington Clamp,  M.D.  [address]  [address]  [place], [place] [address]  Fax: [phone]   Telford Nab, R.N.   Eugenio Hoes. Tawanna Cooler, M.D. [facility]

## 2011-03-17 NOTE — H&P (Signed)
NAME:  [name], [name] NO.:  [id number]   MEDICAL RECORD NO.:  [id number]                   PATIENT TYPE:  INP   LOCATION:  1828                                 FACILITY:  [facility]   PHYSICIAN:  Learta Codding, M.D. [facility]             DATE OF BIRTH:  04-04-1925   DATE OF ADMISSION:  07/15/2004  DATE OF DISCHARGE:                                HISTORY & PHYSICAL   PRIMARY CARE PHYSICIAN:  Tinnie Gens A. Tawanna Cooler, M.D. [facility]   CARDIOLOGIST:  Salvadore Farber, M.D. [facility]   HISTORY OF PRESENT ILLNESS:  The patient is a 77 year old female with a  history of coronary artery disease.  The patient is status post non- drug-  eluting stent placement to the LAD and right coronary artery.  She developed  restenosis in both and in August 2003,  Dr. Samule Ohm performed cutting balloon  angioplasty and brachytherapy to the mid LAD and cutting balloon angioplasty  alone of the ostium right coronary artery.  He was unable to deliver a TAXUS  stent to the ostium of the right coronary artery.  The procedure was also  complicated by ventricular fibrillation requiring a single counter shock  after the final injection of the right coronary artery.  The patient is a  known diabetic.  Her diabetes has been somewhat under poor control.  She  also has a history of ovarian cancer which apparently for which she is in  remission.  This was treated with resection and three rounds of chemotherapy  __________.  The patient is now admitted with symptoms of nausea and  shortness of breath associated with diaphoresis and tightness in the left  arm.  She reports that these symptoms are extremely similar to her prior  presentation in 2003.  The patient currently is pain free.  Her symptoms  responded to nitroglycerin.  Her initial cardiac enzymes markers are  negative.  She has a right bundle branch block on her EKG with no acute  ischemic changes.  Please refer to the written note by Dr. Frederico Hamman regarding  the patient's past medical history, current medications, social history, and  family history.   REVIEW OF SYSTEMS:  Also reviewed by Dr. Frederico Hamman.   PHYSICAL EXAMINATION:  VITAL SIGNS:  Stable with blood pressure 124/77 in  the right arm.  Heart rate is 76 beats per minute.  The patient is afebrile.  NECK:  Reveals a scar on the right carotid from prior carotid  endarterectomy.  There is also a bruit on the right carotid artery.  The  left carotic artery demonstrate no bruit and normal upstroke.  Left  infraclavicular bruit is heard.  The patient also discrepancy on her blood  pressure of 33-mmHg between the two arms.  LUNGS:  Clear.  HEART:  Reveals a regular rate and rhythm.  Normal S1 S2.  ABDOMEN:  Soft.  EXTREMITIES:  Reveals no edema and 2+ peripheral  pulses.   LABS:  Hemoglobin is 13, white count is 13.5.  BUN is 20, creatinine is 0.9,  sodium is 135, potassium is 4.0.   EKG as reviewed above.   PROBLEM LIST:  1.  Coronary artery disease.  The patient presents with symptoms similar to      a prior presentation when she had stenosis of the LAD and right coronary      artery.  2.  History of non-drug-eluting stent place to the left anterior descending      artery and right coronary artery.  3.  Status post restenosis in both left anterior descending artery and right      coronary artery with inability to deliver a TAXUS stent to the right      coronary ostium.  4.  Diabetes mellitus.  5.  Cerebrovascular disease, status post right carotid endarterectomy.  6.  History of ovarian cancer in remission.   PLAN:  The patient will be admitted to rule out myocardial infarction.  Symptoms suggestive of angina, particularly in light of the fact that she is  a diabetic.  She will be started on intravenous nitroglycerin.  In addition,  heparin will be started and the patient will be started on Plavix.  Other  medications including Metoprolol, Furosemide, Altace, and Nifedipine will be   continued.  We plan on proceeding with a cardiac catheterization Monday.  The risks and benefits of this procedure were discussed with the patient and  she is willing to proceed.       GED/MEDQ  D:  07/16/2004  T:  07/16/2004  Job:  [id number]   cc:   Tinnie Gens A. Tawanna Cooler, M.D. [facility]   Salvadore Farber, M.D. [facility]  [address]  [address]  [place]  [place] [address]

## 2011-03-17 NOTE — Consult Note (Signed)
NAME:  [name], [name] NO.:  [id number]   MEDICAL RECORD NO.:  [id number]                   PATIENT TYPE:  OUT   LOCATION:  GYN                                  FACILITY:  [facility]   PHYSICIAN:  De Blanch, M.D.         DATE OF BIRTH:  04-11-1925   DATE OF CONSULTATION:  09/15/2003  DATE OF DISCHARGE:                                   CONSULTATION   REASON FOR CONSULTATION:  A 77 year old white female returns for continuing  followup of a stage 1A grade 3 ovarian carcinoma.   INTERVAL HISTORY:  Since her last visit with Dr. Crista Luria she has done well.  She denies any GI or GU problems.  She has no pelvic pain, pressure, vaginal  bleeding, or discharge.  Her functional status is good, although she notes  that she is somewhat tired.   HISTORY OF PRESENT ILLNESS:  The patient underwent initial surgical  resection and staging in April 2004, for a stage 1A grade 3 ovarian cancer.  She subsequently received three cycles of carboplatin and Taxol chemotherapy  completed in July 2004.  At the completion of her chemotherapy, her CA-125  value was 20.   PAST MEDICAL HISTORY:  1. Coronary artery disease.  2. Peripheral vascular disease.  3. Diabetes.   PAST SURGICAL HISTORY:  1. Angioplasty.  2. Right carotid endarterectomy.  3. Ovarian cystectomy.  4. Tonsils and adenoidectomy.  5. Appendectomy.  6. Ovarian cancer resection and staging.   OBSTETRICAL HISTORY:  Gravida 2.   FAMILY HISTORY:  Aunt with breast cancer.  There is no ovarian, gynecologic,  or colon cancer in the family history.   DRUG ALLERGIES:  None.   REVIEW OF SYSTEMS:  Negative except as noted above.   PHYSICAL EXAMINATION:  GENERAL:  The patient is a healthy white female in no  acute distress.  HEENT:  Negative.  NECK:  Supple without thyromegaly.  There is no supraclavicular or inguinal  adenopathy.  ABDOMEN:  Soft, nontender, no mass, organomegaly, ascites, or hernias  are  noted.  PELVIC:  EGBUS, vagina, bladder, and urethra are normal.  Cervix and uterus  are surgically absent.  Adnexa without masses or nodularity.  Rectovaginal  examination confirms.  EXTREMITIES:  Lower extremities without edema or varicosities.   IMPRESSION:  Stage 1A grade 3 ovarian cancer, clinically free of disease.   CA-125 on August 31, 2003, is 12.5 U/ml.   PLAN:  The patient will return to see Dr. Darrold Span in three months, and  return to see [place] in six months.                                               De Blanch, M.D.    DC/MEDQ  D:  09/15/2003  T:  09/15/2003  Job:  [id number]  cc:   Carrington Clamp, M.D.  [address]  [address]  [place], [place] [address]  Fax: [phone]   Eugenio Hoes. Tawanna Cooler, M.D. [facility]   Telford Nab, R.N.  [address]  [place], [place] [address]   Lennis P. Darrold Span, M.D.  [address] [facility]  [place]  [place] [address]  Fax: [phone]

## 2011-03-17 NOTE — Cardiovascular Report (Signed)
[place]  Patient:    [name], [name] Visit Number: [id number] MRN: [id number]          Service Type: MED Location: [id number] Attending Physician:  [name] Dictated by:   [name], M.D. [facility] Proc. Date: 12/16/01 Admit Date:  12/13/2001 Discharge Date: 12/14/2001   CC:         Evette Georges, M.D. [facility]  Luis Abed, M.D. [facility]   Cardiac Catheterization  DATE OF BIRTH:  1924/12/29  PROCEDURE:  Left heart catheterization and coronary arteriography.  INDICATIONS:  Evaluate patient with unstable angina.  She had a previous history of an anterior infarct treated with thrombolytic therapy with a residual 50% LAD stenosis.  DESCRIPTION OF PROCEDURE:  Left heart catheterization is performed via the right femoral artery.  The artery was cannulated using anterior wall puncture. A #6 French arterial sheath was inserted via the modified Seldinger technique. Preformed Judkins, pigtail, and multipurpose catheter were utilized.  A multipurpose catheter was utilized to cannulate the right coronary artery. The patient tolerated the procedure well and left the lab in stable condition.  RESULTS:  HEMODYNAMICS:  LV 181/10, AO 181/67.  CORONARIES: 1. The left main had 25% distal stenosis. 2. The LAD had a 60% mid lesion right at the takeoff of a large mid diagonal. 3. The circumflex had diffuse luminal irregularities in mid AV groove and    extending into a large mid obtuse marginal. 4. The right coronary artery was a large dominant vessel.  There was a    proximal 75% lesion followed by a long 60% stenosis.  There was mid long    50% stenosis.  (Of note, the patient had nausea and chest discomfort which    was reminiscent of her presenting complaints during injection of this    artery.)  LEFT VENTRICULOGRAM:  The left ventriculogram was obtained in the RAO projection.  The EF was 65% with normal wall motion.  DISTAL  AORTOGRAM:  The distal aortogram was obtained secondary to some difficulty advancing the guide wire.  There was diffuse distal aorta plaquing into the bifurcation.  There was a 90% ostial internal iliac stenosis.  CONCLUSION:  High grade right coronary artery stenosis, likely leading to her presenting symptoms.  Moderate LAD stenosis.  Peripheral vascular disease.  PLAN:  I will review the films with Dr. Juanda Chance and will suggest if he agrees, percutaneous revascularization of the proximal RCA.  I would then suggest a follow-up stress perfusion study in 6 to 8 weeks to evaluate the hemodynamic significance of the LAD lesion.  She should have aggressive secondary risk factor modification. Dictated by:   [name], M.D. [facility] Attending Physician:  [name] DD:  12/16/01 TD:  12/16/01 Job: [id number]

## 2011-03-17 NOTE — Consult Note (Signed)
NAME:  AVIV, ROTA NO.:  [id number]   MEDICAL RECORD NO.:  [id number]                   PATIENT TYPE:  OUT   LOCATION:  GYN                                  [facility]ACILITY[facility]   PHYSICIAN:  De Blanch, M.D.         DATE OF BIRTH:  [birth year]   DATE OF CONSULTATION:  DATE OF DISCHARGE:                                   CONSULTATION   Audio too short to transcribe (less than 5 seconds)                                               De Blanch, M.D.    DC/MEDQ  D:  03/16/2004  T:  03/16/2004  Job:  [id number]

## 2011-03-17 NOTE — Discharge Summary (Signed)
[place]  Patient:    [name], [name] Visit Number: [id number] MRN: [id number]          Service Type: MED Location: 6500 [id number] Attending Physician:  [name] Dictated by:   [name], N.P. Admit Date:  12/13/2001 Discharge Date: 12/17/2001                             Discharge Summary  DATE OF BIRTH:  [birth year].  ADMISSION DIAGNOSES: 1. Rule out myocardial infarction. 2. Near syncopal episode.  DISCHARGE DIAGNOSES: 1. High grade right coronary artery stenosis. 2. Hyperlipidemia. 3. Diabetes. 4. Hypertension.  SIGNIFICANT PROCEDURES DURING ADMISSION: 1. Patient ruled out for myocardial infarction by cardiac enzymes. 2. Cardiac catheterization on 12/16/2001, conclusions were high grade right    coronary artery stenosis likely leading to her presenting symptoms,    moderate left anterior descending stenosis, peripheral vascular disease,    performed by Dr. Rollene Beard. 3. Cardiac catheterization 12/16/2001 by Dr. Charlies Beard, conclusions    were (1) successful stenting of the proximal right coronary artery    with improvement in percent luminal narrowing from 80 to 10%.    (2) Successful stenting of the mid left anterior descending artery w    improvement in percent luminal narrowing from 90% to 10%. 4. Echocardiogram 12/13/2001: (1) left ventricular ejection fraction was    between 45 and 55%. This study was inadequate for the evaluation of left    ventricular regional wall motion. (2) left atrial size was in the upper    limits of normal. (3) technically limited study, there may be inferior    hyperkinesis, however, I think overall left ventricular function is    preserved reasonably well.  The RV is not well seen, prepared by    Dr. [name]. 5. Chest x-ray 12/13/01: (1) subsegmental atelectasis at the left lung base.    (2) tortuous aorta.  SIGNIFICANT LABORATORY DATA DURING ADMISSION:  On 12/17/01  CBC shows white blood cell count of 14.4, hemoglobin and hematocrit 13.3 and 39.0, differential was normal.  12/16/01 Protime was 13.8, INR 1.1.  12/17/01: BMET:  Sodium 133, 3.6 potassium, glucose 172 from a high of 260 on admission, BUN 8, creatinine 0.7, calcium 7.8, albumin low at 2.9.  Hemoglobin A1C: Elevated at 7.2.  LIPID PROFILE:  Cholesterol 117, triglycerides 112, HDL low 34, LDL 61, VLDL 22.  CARDIAC ENZYMES:  Troponin levels times three were between 0.01 and 0.02.  TSH LEVEL:  1.875.  PRIMARY PHYSICIAN:  [name], M.D. [facility]  PRIMARY CARDIOLOGIST:  [name], M.D. [facility]  BRIEF HISTORY OF PRESENT ILLNESS:  The patient is a 77 year old female who stated that she felt dizzy, nauseous and very weak and stated that these symptoms felt similar to the symptoms she had ten years ago when she had a myocardial infarction.  She arrived by EMS and was assessed in the emergency department. Given her significant cardiac history cardiology service was consulted while she was in the emergency department.  She was known to their service.  A rule out for myocardial infarction protocol was initiated and she subsequently underwent cardiac catheterization and stenting with the results as above.  The patient did not have any further episodes of dizziness, shortness of breath or chest pain during her admission.  Her other problems were diabetes, hypertension, dyslipidemia, all of which are being followed up on an outpatient basis  and can continued to be followed up on an outpatient basis.  HOSPITAL COURSE:  Patient remained stable during her admission and was considered stable for discharge on 12/17/01.  MEDICATIONS:  She is to continue her home medications and in addition to take Plavix 75 mg one q.d. with food for one month.  ACTIVITY:  Patient is instructed not to do any heavy lifting or strenuous activity for two to three days.  DIET: Patient is to follow a low fat,  diabetic diet.  She is not to smoke.  FOLLOW UP: Patient is to see [name] and his office is to call the patient for follow up appointment. Dictated by:   [name], N.P. Attending Physician:  [name] DD:  12/30/01 TD:  12/31/01 Job: [id number]

## 2011-03-17 NOTE — Cardiovascular Report (Signed)
NAME:  [name], [name]                        ACCOUNT NO.:  [id number]   MEDICAL RECORD NO.:  [id number]                   PATIENT TYPE:  INP   LOCATION:  6523                                 FACILITY:  [facility]   PHYSICIAN:  [name], M.D. [facility]              DATE OF BIRTH:  1925-06-21   DATE OF PROCEDURE:  07/18/2004  DATE OF DISCHARGE:                              CARDIAC CATHETERIZATION   CLINICAL HISTORY:  Ms. [name] is 77 years old and had stenting of the mid  LAD and proximal right coronary artery in February 2003 by myself with bare  metal stents.  She developed restenosis in both stents and on August 20 she  underwent brachytherapy and percutaneous transluminal coronary angioplasty  of the mid LAD and then on August 21 she was enrolled in the Taxus in-stent  trial and underwent attempted drug-eluting stent placement for in-stent  restenosis in the proximal right coronary artery, but due to guiding  catheter problems and difficulty with access only balloon angioplasty could  be performed.  She was recently admitted with recurrent chest pain and was  studied by Dr. Antoine Poche earlier today.  She was found to have 95% in-stent  restenosis in the right coronary artery.  There were 70% lesion in the mid  right coronary artery and the stent in the mid LAD was patent.  We elected  to proceed with percutaneous coronary intervention on the proximal right  coronary in-stent restenosis.   PROCEDURE:  The procedure was performed via the right femoral artery using  arterial sheath.  We initially tried an AL-1 6 [place] guiding catheter with  side holes and we were able to initially seat the guiding catheter and  advance a PT-2 wire down the vessel.  However, it was quite difficult to  advance any equipment into the vessel due to difficulty with seating the  guiding catheter and difficulty with obtaining a coaxial view.  We were able  to pass a 2.0 x 20-mm Maverick into the lesion and dilate  it with one  inflation of 14 atmospheres for 30 seconds.  We were unable to pass a  cutting balloon and we lost seating of the guiding catheter.  We finally  went back in with an AL 0.75 6 [place] guiding catheter which we had to make  side holes on.  It damped without the side holes.  We were able to navigate  down the vessel with difficulty with this guiding catheter.  Had to use two  wires.  We passed a Whisper wire as well as a PT-2 wire.  We then were able  to dilate again with a 2.75 x 50-mm Quantum Maverick performing one  inflation up to 14 atmospheres for 30 seconds.  We then were able to deploy  a 2.75 x 20-mm Taxus stent deploying this with one inflation of 16  atmospheres for 30 seconds after removing the Whisper wire.  We then post  dilated with a 3.0 x 15-mm Quantum Maverick performing two inflations up to  16 atmospheres for 30 seconds.  Repeat diagnostic studies were then  performed through the guiding catheter.   Procedure was long and difficult, but the patient tolerated the procedure  well and left the laboratory in satisfactory condition.   RESULTS:  Initially, the stenosis within the stent and the proximal right  coronary artery was estimated at 95%.  Following stent placement, this  improved to 0%.  There was residual 70% tandem lesions in the mid right  coronary artery and residual 50% ostial lesion proximal to the stent in the  ostium of the right coronary artery.   CONCLUSION:  Successful PCI for in-stent restenosis (second time) within a  bare metal stent with improvement in stent renarrowing from 95% to 10% using  a Taxus drug-eluting stent.   DISPOSITION:  The patient was returned to the postangioplasty unit for  further observation.      BB/MEDQ  D:  07/18/2004  T:  07/18/2004  Job:  [id number]   cc:   Tinnie Gens A. Tawanna Cooler, M.D. [facility]   Salvadore Farber, M.D. [facility]  [address]  [address]  [place]  [place] [address]   [name], M.D.

## 2011-03-17 NOTE — Cardiovascular Report (Signed)
NAME:  [name], [name] NO.:  [id number]   MEDICAL RECORD NO.:  [id number]                   PATIENT TYPE:  INP   LOCATION:  2010                                 FACILITY:  [facility]   PHYSICIAN:  [name], M.D.                DATE OF BIRTH:  [birth year]   DATE OF PROCEDURE:  07/18/2004  DATE OF DISCHARGE:                              CARDIAC CATHETERIZATION   PRIMARY CARE PHYSICIAN:  Tinnie Gens A. Tawanna Cooler, M.D. [facility].   PROCEDURE:  Left heart catheterization, coronary arteriography.   INDICATION:  Evaluate patient with unstable angina, previous angioplasty of  the LAD and stenting of the ostial right coronary artery.  She has also had  in-stent restenosis with cutting balloon angioplasty to the right coronary  artery.   PROCEDURE NOTE:  Left heart catheterization was performed via the right  femoral artery.  The artery was cannulated using an anterior wall puncture.  A 6 French arterial sheath was inserted via the modified Seldinger  technique.  Preformed Judkins and a pigtail catheter were utilized.  An  Amplatz right #1 catheter was utilized to cannulate the right coronary.  The  patient tolerated the procedure well.   RESULTS:   HEMODYNAMICS:  1.  LV 168/8.  2.  Aortic 168/56.   CORONARIES:  The left main was normal.  The LAD had ostial 25% stenosis and  mid 25% stenosis at a septal perforator.  There were mid luminal  irregularities.  There was a mid diagonal which was moderate size with  ostial 30% stenosis.  There were multiple switchback bends in her LAD and  circumflex.  Circumflex had proximal 25% stenosis.  There were diffuse mid  luminal irregularities.  An OM-1 was small with ostial 25% stenosis.  The OM-  2 was large with luminal irregularities.  The right coronary artery was  large and dominant.  There was proximal stent with mid 99% stenosis.  There  was mid 50% stenosis followed by mid 40% stenosis.  PDA and posterior  lateral were  moderate size and normal.   LEFT VENTRICULOGRAM:  Left ventriculogram was obtained in the RAO  projection.  The EF was 65% and normal.   CONCLUSION:  Severe right coronary artery disease.   PLAN:  The patient will have percutaneous revascularization of the right  coronary artery per Dr. Juanda Chance.      JH/MEDQ  D:  07/18/2004  T:  07/18/2004  Job:  [id number]   cc:   Tinnie Gens A. Tawanna Cooler, M.D. [facility]

## 2011-03-17 NOTE — Consult Note (Signed)
NAME:  ARBOR, LEER NO.:  [id number]   MEDICAL RECORD NO.:  [id number]                   PATIENT TYPE:  OUT   LOCATION:  GYN                                  [facility]ACILITY[facility]  [facility]   PHYSICIAN:  De Blanch, M.D.         DATE OF BIRTH:  [birth year]   DATE OF CONSULTATION:  04/29/2003  DATE OF DISCHARGE:                                   CONSULTATION   REASON FOR CONSULTATION:  A 77 year old white female returns for continuing  follow-up of a stage Ia grade 3 ovarian carcinoma.   INTERVAL HISTORY:  She has now completed two cycles of carboplatin and Taxol  chemotherapy and seems to be tolerating it remarkably well.  It is noted  that her CA-125 at the beginning of chemotherapy was 80 and by April 14, 2003  had fallen to 26.  Preoperatively, her CA-125 value had been 543 units per  mL.   HISTORY OF PRESENT ILLNESS:  The patient underwent exploratory laparotomy  and extended surgical stay on February 10, 2003 with findings of a stage Ia  grade 3 ovarian cancer.  Three cycles of carboplatin and Taxol chemotherapy  were planned.   PAST MEDICAL HISTORY:  Medical illnesses are coronary artery and peripheral  vascular disease; diabetes.   Past surgical history is noted in my note of April 7 and is unchanged, as  are the family history, drug allergies, review of systems.   PHYSICAL EXAMINATION:  VITAL SIGNS:  Weight 123 pounds, blood pressure  126/70.  GENERAL APPEARANCE:  The patient is a healthy white female in no acute  distress.  HEENT:  Reveals alopecia.  NECK:  Supple without thyromegaly.  NODES:  There is no supraclavicular or inguinal adenopathy.  ABDOMEN:  Soft, nontender.  No masses, organomegaly, ascites, or hernias are  noted.  Midline incision is well healed.  PELVIC:  EG/BUS, vagina, bladder, urethra are normal.  The cuff is healing  nicely.  Bimanual and rectovaginal exam reveal no masses, induration, or  nodularity.  The patient has  a considerable amount of hard stool in the  rectosigmoid colon.   IMPRESSION:  Excellent postoperative recovery.  The patient can return to  full levels of activity.   She seems to be tolerating chemotherapy well.  She will continue under the  care of Dr. Darrold Span and receive her last cycle of chemotherapy next week.  Thereafter we plan on repeating an abdominopelvic CAT scan in approximately  a month and then will set up follow-up visits to alternate between Dr.  Darrold Span and myself every three months.                                               De Blanch, M.D.    DC/MEDQ  D:  04/29/2003  T:  04/29/2003  Job:  [id number]   cc:   Eugenio Hoes. Tawanna Cooler, M.D. [facility]   Lennis P. Darrold Span, M.D.  [address] [facility]  [place]  [place] [address]  Fax: [phone]   Carrington Clamp, M.D.  [address]  [address]  [place], [place] [address]  Fax: [phone]   Telford Nab, R.N.  [phone] [address]  [place], [place] [address]

## 2011-03-17 NOTE — Consult Note (Signed)
NAME:  [name], [name]                        ACCOUNT NO.:  [id number]   MEDICAL RECORD NO.:  [id number]                   PATIENT TYPE:  INP   LOCATION:  NA                                   FACILITY:  [facility]   PHYSICIAN:  De Blanch, M.D.         DATE OF BIRTH:  04-30-1925   DATE OF CONSULTATION:  DATE OF DISCHARGE:                                   CONSULTATION   HISTORY. OF PRESENT ILLNESS:  This is a 77 year old white female seen in  consultation at the request of Dr. Carrington Clamp regarding a newly  diagnosed pelvic mass.  The patient claims that she has had right-sided pain  for the past four months, which eventually became more midline and  suprapubic in nature.  She has had chronic constipation, but denies any  other significant GI or GU symptoms.  She was seen by her primary care  physician who found the pelvic mass and obtained a CT scan.  CT scan shows a  12 x 16 x 14 cm mass with some calcifications.  She has some evidence of  ascites around the liver and right hydronephrosis.  An ultrasound of the  pelvis basically confirms the same, and it is thought the mass is the uterus  which is infiltrated by a large mass measuring 10 x 7.8 x 7.8 cm.  The  ovaries cannot be visualized.  The patient has tumor markers including a CA-  125 which is 543 units/ml, and a CA-19.9 which is 4289 units/ml.  She denies  any weight loss or any constitutional symptoms.   PAST MEDICAL HISTORY:  1. The patient has a long-standing history of coronary artery and peripheral     vascular disease.  2. Diabetes.   PAST SURGICAL HISTORY:  1. Angioplasty in 1992.  Apparently at the time of a second attempt at     angioplasty, the patient went into ventricular fibrillation.  2. Right carotid endarterectomy in 2002.  3. The patient underwent an ovarian cystectomy and removal of endometriosis     when she was 77 years of age.  4. Tonsillectomy and adenoidectomy.  5. Appendectomy.   OBSTETRICAL HISTORY:  G2.   FAMILY HISTORY:  Reveals an aunt with breast cancer.  There is no ovarian or  gynecologic or colon cancers in the family history.   DRUG ALLERGIES:  None.   REVIEW OF SYSTEMS:  Reveals no neurologic, pulmonary, GI, or GU symptoms.  She has no evidence of angina, but does have coronary artery disease.  She  had a cardiac catheterization in 6/03 which revealed an ejection fraction of  74% with inferior wall ischemia.  She has recently had a nuclear medicine  stress test which is reported as negative for any ischemia.   PHYSICAL EXAMINATION:  GENERAL:  A well-developed white female in no acute  distress.  HEENT:  Negative.  NECK:  Supple without thyromegaly.  There is no supraclavicular or inguinal  adenopathy.  ABDOMEN:  Soft, and no ascites can be detected.  She does have a palpable  mass in the suprapubic region extending about half way to the umbilicus.  This is slightly tender to deep palpation.  PELVIC:  EGBUS, vagina, bladder, urethra normal.  The cervix is deviated  anteriorly.  The pelvis is filled with a mass which seems to be big, but  free of the pelvic sidewall.  The mobility is very limited.  The mass in  aggregate is approximately 16 cm.  RECTOVAGINAL EXAM:  Confirms this does push into the cul-de-sac.   IMPRESSION:  Large apparently solid pelvic mass with associated pain in a  menopausal patient.  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  I had a lengthy discussion with the patient  and her daughter regarding these recommendations.  They understand the risks  of surgery including hemorrhage, infection, injury to adjacent viscera, or  thromboembolic complications, and the potential for colon or small bowel  surgery, depending upon the extent of  disease.  We will proceed with  preparation for surgery, and the surgery is scheduled for Tuesday, 02/10/03.                                               De Blanch, M.D.    DC/MEDQ  D:  02/04/2003  T:  02/05/2003  Job:  [id number]   cc:   Carrington Clamp, M.D.  [address]  [address]  [place], [place] [address]  Fax: [phone]   Telford Nab, R.N.   Salvadore Farber, M.D. [facility]   Tinnie Gens A. Tawanna Cooler, M.D. [facility]

## 2011-03-17 NOTE — Consult Note (Signed)
[facility]  Patient:    [name], [name] Visit Number: [id number] MRN: [id number]          Service Type: MED Location: [id number] Attending Physician:  [name] Dictated by:   [name], M.D. [facility] Proc. Date: 12/13/01 Admit Date:  12/13/2001   CC:         Evette Georges, M.D. [facility]  Luis Abed, M.D. [facility]   Consultation Report  REASON FOR PRESENTATION:  Evaluate patient with dizziness, diaphoresis, and nausea reminiscent of her previous unstable angina.  HISTORY OF PRESENT ILLNESS:  Patient is a pleasant 77 year old whose past cardiac history includes a myocardial infarction in 1992.  At that time she apparently had streptokinase and clinical reperfusion.  A subsequent catheterization demonstrated a 50% right coronary lesion which has been managed medically over the years.  She says she had one stress test shortly after that event.  She does have peripheral vascular disease with an occluded left carotid and carotid endarterectomy in 2000.  She has been managed aggressively for secondary risk factor modification.  She says that she is an active 77 year old and usually can do her activities of daily living without cardiac symptoms.  Today she awoke with dizziness.  She then got diaphoretic and nauseated. These symptoms occurred while she was in bed and sitting on the side of her bed.  These were exactly the same symptoms she had at the time of her MI.  She has never had chest discomfort, neck discomfort, arm discomfort, activity induced nausea, vomiting, excessive diaphoresis.  She has had no palpitations and no syncope.  She said that yesterday she felt poorly, but could not quantify this.  She has not had any fevers or chills.  She has had no GI complaints.  PAST MEDICAL HISTORY:  Coronary artery disease, hypertension for approximately 10 years, peripheral vascular disease, diabetes mellitus x2-3  years, hyperlipidemia.  PAST SURGICAL HISTORY:  Carotid endarterectomy.  ALLERGIES:  None.  MEDICATIONS: 1. Metoprolol 50 mg b.i.d. 2. Nifedipine 90 mg q.d. 3. Zocor 20 mg q.d. 4. Clonidine 0.1 mg b.i.d. 5. Hydrochlorothiazide 25 mg q.d. 6. Nitroglycerin p.r.n. 7. Elavil 25 mg q.h.s. p.r.n. 8. Glucotrol 10 mg q.a.m. and 5 mg q.p.m. 9. Aspirin.  SOCIAL HISTORY:  The patient is a widow.  She is retired.  She lives alone. She has two children.  She smokes less than two packs per week and has been smoking for over 54 years.  FAMILY HISTORY:  Noncontributory for early coronary artery disease.  REVIEW OF SYSTEMS:  Positive for occasional vertigo with symptoms unlike todays symptoms, urinary frequency, gastroesophageal reflux, chronic constipation.  Otherwise, negative for all other systems.  PHYSICAL EXAMINATION  GENERAL:  The patient is in no distress.  VITAL SIGNS:  Blood pressure 149/53 (no orthostatic blood pressure changes or heart rate changes), heart rate 70 and regular.  HEENT:  Eyelids:  Unremarkable.  Pupils are equal, round, and reactive to light.  Status post cataract bilateral surgery.  Oral mucosa:  Unremarkable.  NECK:  No jugular venous distention.  Wave form within normal limits.  Carotid upstroke brisk and symmetric.  Right carotid bruit.  Well healed right carotid endarterectomy scar.  No thyromegaly.  LYMPHATICS:  No cervical, axillary, or inguinal adenopathy.  LUNGS:  Clear to auscultation bilaterally.  BACK:  No costovertebral angle tenderness.  CHEST:  Unremarkable.  HEART:  PMI not displaced or sustained.  S1, S2 within normal limits.  No S3, S4,  murmurs.  ABDOMEN:  Flat, positive bowel sounds.  Normal in frequency and pitch.  No bruits, rebound, guarding, midline pulsatile mass, hepatomegaly, splenomegaly.  SKIN:  No rashes or nodules.  EXTREMITIES:  Pulses 2+ throughout.  No edema.  NEUROLOGIC:  Oriented to person, place, and time.   Cranial nerves 2-12 grossly intact.  Motor grossly intact throughout.  LABORATORIES:  EKG:  Right bundle branch block (new compared to previous EKGs), sinus bradycardia, rate 53, axis within normal limits, no acute ST-T wave changes.  Chest x-ray:  Tortuous aorta, subsegmental atelectasis of the left base, no congestive heart failure or cardiomegaly.  WBC 18.4, hemoglobin 14.2, platelets 229,000.  CK 35, MB 1.0, troponin 0.01.  ASSESSMENT AND PLAN: 1. Nausea/diaphoresis/dizziness.  The patients symptoms are, per her    description, exactly like her previous angina.  She has multiple ongoing    risk factors, though these have been treated.  Given the previous history,    current symptoms, and the additional risk factors, I think that cardiac    catheterization to rule out an obstructive coronary lesion is prudent.  She    has a high pre test probability for this.  She will continue to be ruled    out.  She will continue with the outpatient antihypertensive regimen.  She    will be treated with a heparin drip. 2. Hyperlipidemia.  Will check a lipid profile. 3. Diabetes.  She will get a hemoglobin A1C and adjustment of her medications    as needed. 4. Hypertension.  She will have her blood pressure followed carefully in the    hospital with adjustments as indicated. Dictated by:   [name], M.D. [facility] Attending Physician:  [name] DD:  12/13/01 TD:  12/13/01 Job: [id number]

## 2011-03-17 NOTE — Consult Note (Signed)
NAME:  [name], [name] NO.:  [id number]   MEDICAL RECORD NO.:  [id number]                   PATIENT TYPE:  OUT   LOCATION:  GYN                                  [facility]ACILITY[facility]  [facility]   PHYSICIAN:  De Blanch, M.D.         DATE OF BIRTH:  November 28, 1924   DATE OF CONSULTATION:  03/16/2004  DATE OF DISCHARGE:                                   CONSULTATION   This 77 year old white female returns for continuing follow-up of stage IA,  grade 3 ovarian cancer.   INTERVAL HISTORY:  Since her last visit the patient has done well.  She  denies any GI or GU symptoms, has no pelvic pain, pressure, vaginal  bleeding, or discharge.  Her functional status is very good.   HISTORY OF PRESENT ILLNESS:  The patient was found to have a stage IA, grade  3 ovarian cancer, April 2004.  She received three cycles of adjuvant  Carboplatin and Taxol chemotherapy under the direction of Dr. Darrold Span.  This  was completed in July of 2004.  The completion of her chemotherapy CA-125  was 20.   PAST MEDICAL HISTORY:  1. Coronary artery disease.  2. Peripheral vascular disease.  3. Diabetes.   PAST SURGICAL HISTORY:  1. Angioplasty.  2. Right carotid endarterectomy.  3. Ovarian cystectomy.  4. Tonsils and adenoidectomy.  5. Appendectomy.  6. Ovarian cancer staging, 2004.   OBSTETRICAL HISTORY:  Gravida 2.   FAMILY HISTORY:  Reveals an aunt with breast cancer.  There is no ovarian or  colon cancer in the family history.   ALLERGIES:  Drug allergies:  None.   REVIEW OF SYSTEMS:  Negative except as noted above.   PHYSICAL EXAMINATION:  VITAL SIGNS:  Weight:  129 pounds.  GENERAL:  The patient is a healthy, elderly white female in no acute  distress.  HEENT:  Negative.  NECK:  Supple without thyromegaly.  There is no supraclavicular or inguinal  adenopathy.  ABDOMEN:  Soft, nontender, no masses, organomegaly, ascites, or hernia are  noted.  PELVIC:  EGBUS.  Vagina,  bladder, urethra are normal.  Cervix and uterus are  surgically absent.  Adnexa without masses.  Rectovaginal exam confirms.   LABORATORY DATA:  The patient's laboratory work was obtained yesterday and  of note, her CA-125 remains normal (14.6 units/ml).   PLAN:  The patient will return to see Dr. Darrold Span in three months, return to  see [place] in six months.                                               De Blanch, M.D.    DC/MEDQ  D:  03/16/2004  T:  03/16/2004  Job:  [id number]   cc:   Carrington Clamp, M.D.  [address]  [phone]  [place], [place] [address]  Fax: [phone]   Eugenio Hoes. Tawanna Cooler, M.D. [facility]   Telford Nab, R.N.  [address]  [place], [place] [address]   Lennis P. Darrold Span, M.D.  [address] [facility]  [place]  [place] [address]  Fax: [phone]   Salvadore Farber, M.D. [facility]  [address]  [address]  [place]  [place] [address]

## 2011-03-17 NOTE — Cardiovascular Report (Signed)
[name]  Patient:    [name], [name] Visit Number: [id number] MRN: [id number]          Service Type: MED Location: [id number] Attending Physician:  [name] Dictated by:   Everardo Beals Juanda Chance, M.D. [facility] Proc. Date: 12/16/01 Admit Date:  12/13/2001 Discharge Date: 12/14/2001   CC:         Evette Georges, M.D. [facility]  [name], M.D. [facility]  Cardiopulmonary Lab   Cardiac Catheterization  CLINICAL HISTORY:  [name] is 77 years old and was recently admitted with chest pain consistent with unstable angina.  She was studied earlier today by Dr. Antoine Poche, who found a tight lesion in the proximal right coronary artery and mid LAD.  We brought her back later the same day for a percutaneous coronary intervention.  PROCEDURAL NOTE:  The procedure was performed by the right femoral artery using an arterial sheath and we eventually used an AL1 #6 [place] guiding catheter with side holes for the right coronary artery.  Even with this catheter, seating of the catheter was difficult.  We navigated down the vessel with a short floppy wire without too much difficulty.  It was difficult to place the stent, but we were finally able to do this and we used a 2.25 x 18 mm Pixel and deployed this with two inflations of 16 and 18 atm for 15 and 28 sec.  We then pulse dilated with a 2.75 x 15 mm Quantum Maverick, performing two inflations of tandem 13 atm for 35 and 28 sec.  We then approached the left anterior descending artery.  We initially used the JR3.5 guiding catheter and a short luge wire.  We were unable to get a Quantum Maverick down the vessel, so we used a 2.25 x 15 mm CrossSail.  We performed three inflations up to 10 atm for 29 sec.  We tried to get a 2.25 x 13 mm Pixel, but were unable to advance down the lesion.  There was marked tortuosity in the proximal vessel and in the distal vessel.  For this reason we exchanged guides and  went in with an EBU 3.75 Medtronic guide and an intermediate support Grafix PT wire.  We dilated again with a 2.25 x 12 mm Quantum Maverick, with one inflation of 12 atm for 27 sec; then we deployed a 2.25 x 8 mm Pixel with two inflations of 12 and 10 atm for 55 and 30 sec.  We then pulse dilated a 2.5 x 8 mm Quantum Maverick with one inflation of 14 atm for 39 sec.  Repeat diagnostics were then performed through the guiding catheter.  The procedure was very long and difficult due to difficulty with seating the guiding catheter in the right coronary artery and difficulty with accessing the left anterior descending artery; but, the patient tolerated the procedure well and left the laboratory in satisfactory condition.  RESULTS:  Initially stenosis in the proximal right coronary artery was estimated at 80%.  Following stenting this was improved to 10%.  Initially the stenosis in the mid LAD, which was located right at the small diagonal branch, was estimated at 90%.  Following stenting this improved to 10%.  CONCLUSIONS: 1. Successful stenting of the proximal right coronary artery, with    improvement in percent luminal narrowing from 80% to 10%. 2. Successful stenting of the mid left anterior descending artery, with    improvement in percent luminal narrowing from 90% to 10%.  DISPOSITION:  The patient returned to the PACU for further observation. Dictated by:   Everardo Beals Juanda Chance, M.D. [facility] Attending Physician:  [name] DD:  12/16/01 TD:  12/17/01 Job: [id number]

## 2011-03-17 NOTE — Cardiovascular Report (Signed)
[name], [name] NO.:  [id number]   MEDICAL RECORD NO.:  [id number]                   PATIENT TYPE:  INP   LOCATION:  2922                                 FACILITY:  [facility]   PHYSICIAN:  Salvadore Farber, MD [facility]            DATE OF BIRTH:  24-May-1925   DATE OF PROCEDURE:  06/18/2002  DATE OF DISCHARGE:                              CARDIAC CATHETERIZATION   PROCEDURES:  Coronary angiography, left ventriculography, left heart  catheterization.   INDICATIONS:  Unstable angina, positive exercise test after stenting of her  LAD and right coronary artery in February of 2003.   DIAGNOSTIC TECHNIQUE:  Informed consent was obtained. Under 2% lidocaine  local anesthesia, a 6 French sheath was placed in the right femoral artery  using the modified Seldinger technique. The left coronary artery was engaged  with a JL4 catheter. The right coronary artery was engaged with difficulty  using an AR-1 catheter. A 6 French  pigtail catheter was advanced into the  left ventricle.  Pressures were measured.  Ventriculography was performed by  power injection. Following this procedure, the sheaths will be  removed in  the holding room.   FINDINGS:  1. Left main:  A 30% distal stenosis.  2. LAD:  The LAD is a moderate sized vessel which gives rise to a single     moderate sized diagonal branch. There is 70% in-stent re-stenosis of the     mid LAD stent as well as 60% ostial stenosis of the jailed diagonal     branch.  3. Circumflex:  There is a 40% stenosis of the mid vessel.  4. RCA:  There is 99% in-stent re-stenosis of the proximal right coronary     artery stent.  The PDA is supplied via modest collaterals from the LAD.  5. EF equals 60% with severe posterobasal hypokinesis. There is no aneurysm     evident on RAO ventriculography.  6. LV pressures is 145/15/24 after the administration of contast.  There is     no mitral regurgitation. There is no aortic  stenosis.   </IMPRESSION/PLAN>  Unfortunately, the patient has severe in-stent re-stenosis of both the left  anterior descending and right coronary artery stent.  These are both  proliferative restenoses.  Radiation oncology is not available today. We  will therefore bring her back tomorrow for angioplasty and brachytherapy of  both the LAD and the right coronary artery in-stent re-stenotic lesions. In  the meantime, we will continue Plavix and aspirin.                                                     Salvadore Farber, MD [facility]    WED/MEDQ  D:  06/18/2002  T:  06/20/2002  Job:  [id number]

## 2011-03-17 NOTE — Op Note (Signed)
NAME:  [name], [name] NO.:  [id number]   MEDICAL RECORD NO.:  [id number]                   PATIENT TYPE:  INP   LOCATION:  E454                                 [facility]AC[facility]   PHYSICIAN:  De Blanch, M.D.         DATE OF BIRTH:  [birth year]   DATE OF PROCEDURE:  02/10/2003  DATE OF DISCHARGE:                                 OPERATIVE REPORT   PREOPERATIVE DIAGNOSIS:  Pelvic mass.   POSTOPERATIVE DIAGNOSIS:  Right ovarian tumor, possibly ovarian cancer.   PROCEDURE:  Exploratory laparotomy, total abdominal hysterectomy and  bilateral salpingo-oophorectomy.  Right pelvic and aortic lymphadenectomy,  omentectomy, multiple peritoneal biopsies (comprehensive staging of ovarian  cancer).   SURGEON:  De Blanch, M.D.   ASSISTANTS:  1. Carrington Clamp, M.D.  2. Telford Nab, R.N.   ANESTHESIA:  General with orotracheal tube.   ESTIMATED BLOOD LOSS:  850 mL.   SURGICAL FINDINGS:  At the time of exploratory laparotomy, the patient was  found to have a solid and cystic pelvic mass approximately 12 cm in diameter  which was nodular, smooth, and adherent to the right pelvic sidewall.  It  was not adherent to the sigmoid colon or pelvic peritoneum, however.  Exploration of the upper abdomen including the diaphragm, liver, spleen,  stomach, omentum, small and large bowel were normal except for some small  excrescences on the sigmoid colon.  The remainder of the peritoneal surfaces  in the pelvis and abdomen were normal.  The left ovary was adherent to the  broad ligament.  This appeared to be normal as did the uterus.  At the  completion of the surgical procedure, there was no gross residual disease.   DESCRIPTION OF PROCEDURE:  The patient was brought to the operating room and  after the satisfactory attainment of general anesthesia was placed in the  modified lithotomy position in the [place] stirrups.  The anterior  abdominal  wall, perineum and vagina were prepped with Betadine.  A Foley catheter was  placed, and the patient was draped.  The abdomen was entered through a  midline incision which extended above the umbilicus.  The peritoneal  washings were obtained from the pelvis.  The abdomen and pelvis were  explored with the above noted findings.  A Bookwalter retractor was  positioned, and the small bowel was packed out of the pelvis.  The right  pelvic sidewall peritoneum was opened, and the round ligament was divided.  The ovarian vessels were skeletonized, the ureters were identified, and the  vessels clamped, cut and doubly free tied.  The ureter was noted to be  dilated apparently due to pressure from the pelvic mass to the pelvic side  wall.  The mass was relatively mobile, however, adherent to the right pelvic  sidewall.  Ureterolysis was performed to mobilize the ureter away from the  pelvic sidewall and the mass.  With the ureter dissected free from the  pole  laterally, the peritoneum beneath the ovarian mass was incised.  The bladder  flap to the peritoneum was advanced, and the mass lifted into the operative  field.  The uterus was grasped with two long Kelly clamps and the fallopian  tube and uterine ovarian anastomosis was clamped with a parametrial clamp  and divided.  The right tube and ovary were handed off the operative field  for frozen section.  Frozen section report revealed this to be likely a  malignancy.  The left pelvic sidewall was open.  The ovarian vessels were  skeletonized, clamped, free tied and suture ligated after the ureter had  been identified.  The bladder flap was advanced with sharp and blunt  dissection.  The uterine vessels were skeletonized, clamped, cut and suture  ligated.  In a stepwise fashion, the paracervical and cardinal ligaments  were clamped, cut and suture ligated.  The vaginal angles were clamped, cut  and divided and the vagina transected from  its connection to the cervix and  uterus.  The cervix, left tube and ovary are handed off of the operative  field.  The vaginal angles were transfixed with interrupted sutures of 0  Vicryl.  The center portion of the vagina was closed with interrupted figure-  of-eight sutures of 0 Vicryl.  Bleeding from the right pelvic sidewall where  the tumor mass had been adherent to the peritoneum was recognized.  A series  of clips were required to control additional vessels that were arising from  the uterine artery and vein.  Throughout the dissection, the ureter was  identified and protected.  Once hemostasis was achieved, attention was  turned to the upper abdomen.  A Pap smear of the right diaphragm was  obtained for cytology.   Attention was turned to performing and omentectomy.  The omentum was  mobilized from its attachments to the transverse colon and vascular pedicles  clamped, cut and free tied.  The omentum was divided into multiple pieces  and submitted to pathology.   The right pelvic sidewall was reexposed.  The paravesical and pararectal  spaces were opened.  Pelvic lymphadenectomy from the right side was  performed excising lymph nodes from the external iliac artery and vein down  to and slightly below the circumflex iliac vein.  Dissection continued along  the hypogastric artery removing these lymph nodes.  These were all submitted  as external iliac nodes.  Using the vein retractor, the right external iliac  vein was elevated and the obturator hip space exposed.  Lymph nodes from the  obturator fossa were removed out to the psoas muscle and down to the  obturator foramen.  Throughout the dissection, the obturator nerve was  identified and protected.  Hemostasis was achieved with hemoclips.   Periaortic lymphadenectomy was then performed.  The peritoneum overlying the  right common iliac artery and aorta was incised.  The right ureter was mobilized laterally and the  retroperitoneal portion of the duodenum was  mobilized and elevated.  The ovarian vein was identified.  Dissection then  removed all lymph nodes from the right common iliac artery, aorta and vena  cava.  Hemostasis was again achieved with hemoclips and cautery.  Care was  taken to avoid injury to the ovarian vein.  At the completion of this  dissection, hemostasis was excellent.   The peritoneal biopsies were obtained from the pericolic gutters and the  pelvis.  These were submitted as separate specimens.  The nodules on the  sigmoid colon were identified and excised.  The pelvis was reexplored.  Hemostasis was achieved.  The packs and retractors were removed.  The  anterior abdominal wall was closed in layers, first  being a running mass closure using #1 PDS.  Subcutaneous tissue was  irrigated.  Hemostasis was achieved with cautery, and skin closed with skin  staples.  Dressing was applied.  The patient was awakened from anesthesia  and taken to the recovery room in satisfactory condition.  Sponge, needle  and instrument counts were correct x2.                                               De Blanch, M.D.    DC/MEDQ  D:  02/10/2003  T:  02/10/2003  Job:  [id number]   cc:   Carrington Clamp, M.D.  [address]  [address]  [place], [place] [address]  Fax: [phone]   Telford Nab, R.N.   Eugenio Hoes. Tawanna Cooler, M.D. [facility]

## 2011-03-17 NOTE — Discharge Summary (Signed)
NAMEAKARI, DEFELICE NO.:  [id number]   MEDICAL RECORD NO.:  [id number]          PATIENT TYPE:  INP   LOCATION:  6523                         FACILITY:  [facility]   PHYSICIAN:  Salvadore Farber, M.D. LHCDATE OF BIRTH:  October 02, 1925   DATE OF ADMISSION:  07/16/2004  DATE OF DISCHARGE:  07/19/2004                           DISCHARGE SUMMARY - REFERRING   DISCHARGE DIAGNOSES:  1.  Coronary artery disease status post percutaneous transluminal coronary      angioplasty of the right coronary artery secondary to instent      restenosis.  2.  Right groin bruit, no pseudoaneurysm or arteriovenous fistula.  3.  Diabetes mellitus, treated.  4.  Depression, treated.  5.  Dyslipidemia, treated.  6.  History of ovarian cancer status post chemotherapy and radiation      therapy.   HOSPITAL COURSE:  Ms. Hebert is a 77 year old female who presented to the  emergency room with nausea, diaphoresis and left arm/chest discomfort.  She  did rule out for myocardial infarction, however, ultimately underwent  cardiac catheterization.  The catheterization took place on July 18, 2004, and she was found to have severe right coronary artery disease.  She  then underwent drug-eluting stent placement to the right coronary artery for  instent restenosis.  Post procedure she did have a right groin bruit and  this area was ultrasounded; there was no evidence of AV fistula or  pseudoaneurysm.  She was discharged to home the following the day in stable  condition.   DISCHARGE MEDICATIONS INCLUDE:  1.  Plavix 75 mg daily.  2.  Altace 2 mg daily.  3.  Metoprolol 100 mg 1/2 tablet p.o. b.i.d.  4.  Nifedipine 90 mg one tablet daily.  5.  Furosemide 20 mg daily.  6.  Nitroglycerin p.r.n.  7.  Lipitor 40 mg q.h.s.  8.  Glucotrol XL 10 mg before breakfast and 5 mg before supper.  9.  Aspirin daily.  10. Calcium.  11. Amitriptyline 25 mg q.h.s.  12. Multivitamins and calcium daily.   No  straining or lifting over 10 pounds for 1 week, remain on a liquid diet,  cleaning of her cath site with soap and water, no scrubbing, call for  questions or concerns, and she is to followup on July 25, 2004, at 12  p.m. at Dr. Melinda Crutch office for a groin check.       LB/MEDQ  D:  08/30/2004  T:  08/30/2004  Job:  [id number]   cc:   Salvadore Farber, M.D. [facility]  [address]  [address]  [place]  [place] [address]   Eugenio Hoes. Tawanna Cooler, M.D. [facility]

## 2011-03-17 NOTE — Consult Note (Signed)
NAMESAGRARIO, LINEBERRY NO.:  [id number]   MEDICAL RECORD NO.:  [id number]          PATIENT TYPE:  OUT   LOCATION:  GYN                          FACILITY:  [facility]   PHYSICIAN:  De Blanch, M.D.DATE OF BIRTH:  12-26-1924   DATE OF CONSULTATION:  03/21/2005  DATE OF DISCHARGE:  03/21/2005                                   CONSULTATION   This 77 year old white female returns for continuing follow-up of stage IA,  grade 3 ovarian cancer.   INTERVAL HISTORY:  Since her last visit she has done well.  She denies any  GI or GU symptoms, has no pain, pressure, or vaginal discharge or bleeding.  Her functional status is good.   HISTORY OF PRESENT ILLNESS:  The patient underwent initial surgical  resection and staging of grade 3 ovarian cancer, April 2004.  She was found  to have a stage IA and subsequently received three cycles of adjuvant  Carboplatin and Taxol chemotherapy.  She has been followed since then with  no evidence of recurrent disease.   PAST MEDICAL HISTORY:  1.  Coronary artery disease.  2.  Peripheral vascular disease.  3.  Diabetes.   PAST SURGICAL HISTORY:  1.  Angioplasty with carotid endarterectomy.  2.  Ovarian cystectomy.  3.  Tonsillectomy and adenoidectomy.  4.  Appendectomy.  5.  Ovarian cancer resection and staging, 2004.  6.  Coronary artery stent placement, 2005.   OBSTETRICAL HISTORY:  Gravida 2.   FAMILY HISTORY:  Breast cancer in an aunt.  There is no other family history  of ovarian or colon or uterine cancer.   ALLERGIES:  Drug allergies:  None.   REVIEW OF SYSTEMS:  Negative except as noted above.   PHYSICAL EXAMINATION:  VITAL SIGNS:  Weight 126-1/2 pounds, blood pressure  140/45.  GENERAL:  The patient is a healthy white female in no acute distress.  HEENT:  Negative.  NECK:  Supple without thyromegaly.  There is no supraclavicular or inguinal  adenopathy.  ABDOMEN:  Soft, nontender.  No masses or organomegaly,  ascites, or hernias  are noted.  PELVIC:  EGBUS, vagina, bladder, and urethra are normal.  The cuff is well-  supported.  Bimanual and rectovaginal exams reveal no masses, induration, or  nodularity.  EXTREMITIES:  Lower extremities without edema or varicosities.   IMPRESSION:  Stage IA, grade 3 ovarian cancer.  No evidence of recurrent  disease.   LABORATORY DATA:  Laboratory work is reviewed.  Her CA-125 is 14.7 (Mar 17, 2005).   FOLLOW UP:  The patient will return to see Dr. Darrold Span in six months and  return to see [place] in one year.       DC/MEDQ  D:  04/02/2005  T:  04/02/2005  Job:  [id number]   cc:   Lennis P. Darrold Span, M.D.  [address] [facility]  [place]  [place] [address]  Fax: [phone]   Telford Nab, R.N.  [phone] [address]  [place], [place] [address]   Carrington Clamp, M.D.  [address]  [address]  [place], [place] [address]  Fax: [phone]   Eugenio Hoes. Tawanna Cooler, M.D. [facility]   Salvadore Farber, M.D. [facility]  [address]  [address]  [place]  [place] [address]

## 2011-03-17 NOTE — Assessment & Plan Note (Signed)
[facility]                            CARDIOLOGY OFFICE NOTE   NAME:Case, [name]                     MRN:          [id number]  DATE:12/31/2006                            DOB:          05-May-1925    PRIMARY CARE PHYSICIAN:  Dr. Alonza Smoker.   HISTORY OF PRESENT ILLNESS:  [name] is a 77 year old woman with  coronary disease.  She is status post drug-eluting stent placement in  the ostium of the right coronary artery and bare metal stenting of the  mid LAD.  This was performed September 2005.  She has not had any  recurrent chest discomfort.  Left ventricular systolic function remains  normal.  She is not having any exertional dyspnea, PND, orthopnea,  edema, or claudication.  In short, she feels herself to be doing very  well.   CURRENT MEDICATIONS:  1. Altace 10 mg daily.  2. Metoprolol 50 mg twice daily.  3. Nifedipine XL 90 mg daily.  4. Lasix 20 mg daily.  5. Aspirin 325 mg daily.  6. Multivitamin.  7. Calcium 600 mg daily.  8. Zocor 40 mg daily.  9. Glipizide ER 10 mg twice daily.  10.Amitriptyline 25 mg q.h.s.  11.Fish oil 1000 mg daily.  12.Metformin 500 mg daily.   PHYSICAL EXAMINATION:  On physical examination, she is generally well  appearing, in no distress, with heart rate 60, blood pressure 96/52 on  the left and 126/50 on the right.  Weight is 120 pounds.  Weight is  stable compared with a year ago.  NECK:  No jugular venous distention, thyromegaly, or lymphadenopathy.  LUNGS:  Clear to auscultation.  CHEST:  Nondisplaced point of maximal cardiac impulse.  There is a  regular rate and rhythm with a 2/6 systolic ejection murmur at the right  upper sternal border. There is no S3.  ABDOMEN:  Soft, nontender, nondistended.  There is no  hepatosplenomegaly.  Bowel sounds are normal.  No midline pulsatile  mass.  EXTREMITIES:  Warm, without cyanosis, clubbing or edema or ulcerations.  Carotid pulses 2+ bilaterally with  bilateral bruits.   IMPRESSION/RECOMMENDATIONS:  1. Coronary disease:  Doing nicely after drug-eluting stent placement      in the ostium of the right coronary artery and bare mental stent in      the LAD.  Continue beta blocker, ACE inhibitor, aspirin, Statin.  2. Hypercholesterolemia:  Managed by Dr. Tawanna Cooler.  Goal LDL less than 70.  3. Diabetes mellitus:  Managed by Dr. Tawanna Cooler.  4. Atherosclerotic carotid disease:  Asymptomatic.  Chronic occlusion      on the left.  40-59% stenosis on the right as assessed __________ .      Due for followup in one year.  5. Left subclavian stenosis:  Asymptomatic, with retrograde flow in      the vertebral.  Continue conservative management.  6. Disposition:  We will make no changes in her medications today.  We      will have her back in one year.     Salvadore Farber, MD  Electronically Signed  WED/MedQ  DD: 12/31/2006  DT: 12/31/2006  Job #: [id number]

## 2011-03-17 NOTE — Cardiovascular Report (Signed)
NAMEKIRSTAN, FENTRESS NO.:  [id number]   MEDICAL RECORD NO.:  [id number]                   PATIENT TYPE:  INP   LOCATION:  3715                                 FACILITY:  [facility]   PHYSICIAN:  Salvadore Farber, MD [facility]            DATE OF BIRTH:  Oct 16, 1925   DATE OF PROCEDURE:  06/19/2002  DATE OF DISCHARGE:  06/21/2002                              CARDIAC CATHETERIZATION   PROCEDURES:  Cutting Balloon angioplasty and beta brachytherapy to the mid  left anterior descending, balloon angioplasty of the proximal right coronary  artery.   INDICATIONS:  The patient is a 77 year old lady who presents with unstable  angina. Exercise test demonstrated inferior ischemia. Cardiac  catheterization on June 18, 2002, demonstrated severe in-stent re-stenosis  of both the mid LAD stent and the proximal RCA stent.  She therefore returns  for brachytherapy.   The patient is consented to participate in the TAXUS V ISR trial comparing  brachytherapy with paclitaxel coated stent. Her LAD was treated with planned  brachytherapy outside of the trial. Her right coronary artery lesion was  randomized to the coated stent arm with a trial.   INTERVENTIONAL TECHNIQUE:  Informed consent was obtained. Under 2% lidocaine  local anesthesia, a 7 French sheath was placed in the right femoral artery  using the modified Seldinger technique. Anticoagulation was initiated with  heparin and Integrilin to achieve an ACT of greater than 250 seconds. A 7  [place], EBU, 3.5 catheter was advanced over a wire in the patient's left  main coronary artery. A luge wire was advanced to the distal LAD.  A 2.25 x  6 mm Cutting Balloon was advanced across the lesion and used to dilate with  a series of four inflations at 10 atmospheres covering the entire length of  the stent. A 2.5 x 32 mm Galileo system was then advanced across the lesion.  Active wire was left in place for 197 seconds.  Final  angiogram demonstrated  no residual stenosis and TIMI-3 flow.   Attention was then turned to the right coronary artery.  After being unable  to cannulate the artery with a hockey-stick guide, it was cannulated with a  AL-1 guide with difficulty. A PT Graphix wire was advanced across the lesion  and into the distal right coronary artery.  A 1.5 x 15 mm Maverick balloon  was advanced across the lesion and into the distal right coronary artery.  A  1.5 x 15 mm Maverick balloon was advanced across the lesion and into the  distal right coronary artery.  The wire was exchanged via the balloon  catheter to a Sport wire. The balloon catheter was then withdrawn back to  the lesion and used to pre-dilate with a single inflation to 8 atmospheres.  A 2.25 x 15 mm Quantum was then advanced across the lesion and used to pre-  dilate at 18 atmospheres. Multiple  attempts to pass the 2.5 x 24 mm TAXUS V  stent were unsuccessful. I then advanced a BMW wire across the lesion as a  buddy wire. Further attempts to pass the stent with this buddy wire in  place was again unsuccessful.  I then proceeded to further dilate the lesion  with a 2.75 x 15 mm Quantum at 18 atmospheres to optimize the balloon  angioplasty result. Final angiograms demonstrated no residual stenosis and  TIMI-3 flow. This final angiogram was complicated by ventricular  fibrillation. The patient received two precordial thumps without changing  the rhythm. She was then successfully cardioverted with 200 joules delivered  once. She had a brief seizure but rapidly regained consciousness.   COMPLICATIONS:  Ventricular fibrillation responding to a single  defibrillation.   IMPRESSION:  Successful Cutting Balloon angioplasty and brachytherapy of the  mid left anterior descending resulting in TIMI-3 flow and no residual  stenosis. It was successful balloon angioplasty of the proximal right  coronary artery. However, I was unable to elicit the  TAXUS/stent across the  lesion. There is TIMI-3 flow and no residual stenosis after balloon  angioplasty.   PLAN:  We will transfer the patient to the cardiac intensive care unit for  close monitoring of her rhythm. Serial enzymes will be cycled. Integrilin  will be continued for 18 hours. Plavix will be continued for at least six  months.  Aspirin will be continued indefinitely.                                                    Salvadore Farber, MD [facility]    WED/MEDQ  D:  06/20/2002  T:  06/23/2002  Job:  [id number]   cc:   Tinnie Gens A. Tawanna Cooler, M.D. [facility]

## 2011-03-17 NOTE — H&P (Signed)
. [facility]  Patient:    KINLEY, DOZIER Visit Number: [id number] MRN: [id number]          Service Type: EMS Location: [place] Attending Physician:  Lorre Nick Dictated by:   Sonda Primes, M.D. [facility] Admit Date:  12/13/2001   CC:         Evette Georges, M.D. [facility]             Luis Abed, M.D. [facility]                         History and Physical  DATE OF BIRTH:  1925-09-08.  CHIEF COMPLAINT:  Nausea and vomiting, almost passed out.  HISTORY OF PRESENT ILLNESS:  The patient is a 77 year old white female who woke up this morning dizzy with nausea and felt very weak and sat on the edge of the bed and felt like she was going to pass out.  She called 911 due to the fact that she thinks she had similar symptoms 10 years ago when she had her heart attack.  She arrived by EMS and received initial assessment and work-up at the emergency room.  PAST MEDICAL HISTORY:  Hypertension, peripheral vascular disease, coronary disease, myocardial infarction 10 years ago with a heart catheterization status post carotid endarterectomy, hyperlipidemia.  SOCIAL HISTORY:  She lives alone.  She has 2 grown children.  She smokes 1 pack every 3 weeks or so.  Denies drinking alcohol.  ALLERGIES:  None.  FAMILY HISTORY:  Father died with coronary artery disease.  CURRENT MEDICATIONS:  1. Metoprolol 100 mg 1/2 twice a day.  2. Nifedipine 90 mg 1 once a day.  3. Zocor 40 mg 1/2 once a day.  4. Clonidine 0.1 mg twice a day.  5. Hydrochloride 25 mg 1 in the morning.  6. NitroQuick 0.4 sublingual p.r.n.  7. Amitriptyline 25 mg at h.s.  8. Glucotrol XL 10 mg in the morning 5 mg at night.  9. Ecotrin 1 once a day. 10. Multivitamin. 11. Calcium 600 mg once a day.  REVIEW OF SYSTEMS:  Positive for infrequent angina.  Denies shortness of breath.  No weight gain, no weight loss.  No blood in the stool.  The rest is negative.  PHYSICAL EXAMINATION:  VITAL  SIGNS:  Temperature 97.8, blood pressure 116/44.  Pulse 54, respirations 20.  She is in no acute distress.  SKIN:  With aging changes.  HEENT:  Moist mucosa.  NECK:  Supple.  No thyromegaly or bruits.  LUNGS:  Clear to auscultation and percussion. No wheezes or rales.  HEART:  S1, S2, no murmur, no gallop.  ______ to percussion.  ABDOMEN:  Soft, nontender, no organomegaly.  No masses.  EXTREMITIES:  Lower extremities without edema.  Peripheral pulses seemed to be distant.  Lower extremities without edema.  NEUROLOGIC:  She is alert and coordinated.  Cranial nerves II-XII intact. Muscle strength normal.  LABORATORY DATA:  Chest x-ray without infiltrate.  EKG with sinus bradycardia and a right bundle-branch block.  No acute changes.  CK 35.  Troponin 0.01. White count 18.4, hemoglobin 14.2.  ASSESSMENT AND PLAN:  1. Near syncope.  Rule out myocardial infarction. Admit to telemetry.  2. Vertigo and nausea could be related to problem #1.  Plan as above.  3. Coronary artery disease with a history of myocardial infarction.  Obtain     cardiology consult today.  Continue medications.  Start on IV  heparin.  4. Peripheral vascular disease.  Continue current therapy.  5. Hypertension.  Controlled.  6. Tobacco smoking.   Needs to discontinue.  7. Hyperlipidemia.  Continue therapy.  8. Type 2 diabetes mellitus.  Continue current therapy.  Check sugars 4 times     a day.  Sliding scale humalog 4 times a day. Dictated by:   Sonda Primes, M.D. [facility] Attending Physician:  Lorre Nick DD:  12/13/01 TD:  12/13/01 Job: [id number]

## 2011-03-17 NOTE — Discharge Summary (Signed)
NAME:  [name], [name]                        ACCOUNT NO.:  [id number]   MEDICAL RECORD NO.:  [id number]                   PATIENT TYPE:  INP   LOCATION:  0467                                 FACILITY:  [facility]   PHYSICIAN:  Carrington Clamp, M.D.              DATE OF BIRTH:  Apr 30, 1925   DATE OF ADMISSION:  02/10/2003  DATE OF DISCHARGE:  02/15/2003                                 DISCHARGE SUMMARY   ADMISSION DIAGNOSIS:  A 12 x 16 x 14 cm pelvic mass.   DISCHARGE DIAGNOSIS:  Probable ovarian cancer.   PROCEDURES:  1. Total abdominal hysterectomy and bilateral salpingo-oophorectomy.  2. Omentectomy.  3. Right pelvic lymphadenectomy.  4. Peri-aortic lymphadenectomy.  5. Peritoneal biopsies.   LABORATORY DATA:  The preoperative hemoglobin and hematocrit were 12.9 and  37.7.  Postoperative hemoglobin and hematocrit were 7.9 and 23.3.  Post-  transfusion hemoglobin was 10.8.   HISTORY OF PRESENT ILLNESS:  Please refer to dictated history and physical  on chart by Dr. De Blanch.  Briefly, this is a 77 year old white  female seen secondary to a pelvic mass.  A decision was made to take her to  the operating room for a total abdominal hysterectomy, bilateral salpingo-  oophorectomy, and staging.   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  The entire staging  procedure was then performed, and the patient tolerated the procedure well.  On postoperative day #1, the patient was doing very well.  Because of her  history of diabetes and heart disease, Dr. Rene Paci of [facility] was asked to see the patient and she helped manage the patient's  medications.  On postoperative day #2, the patient was still having  difficulties tolerating p.o.  On postoperative day #2, she had a mild  temperature of 100.8,  and had received three doses of Ancef postoperatively.  Decision was made to hold on any other antibiotic unless the temperatures  persisted.  The patient was tolerating clears by postoperative day #2, and  Dr. Felicity Coyer was helping to take care of the patient's blood sugars.  Also  on postoperative day #2, the patient's hemoglobin and hematocrit had dropped  to 7.9 and 23.3, and secondary to her cardiac history, it was decided to  transfuse her.  The patient received 2 units of packed red blood cells.   On postoperative day #3, the patient was doing much better, feeling better,  also secondary to the transfusion her hemoglobin went up to 10.7.  By  postoperative day #5, the patient was finally eating and voiding without  complications.  She was ambulating well without problems.  The patient was  feeling much better, and was discharged to home with the following:   DISCHARGE MEDICATIONS:  1. Lortab 5 mg one p.o.  q.4-6h. p.r.n. pain.  2. Colace 100 mg over-the-counter p.r.n.  3. Milk of magnesia over-the-counter p.r.n.  4. Glucerna for supplemental diet.   DISCHARGE INSTRUCTIONS:  The patient was to call if her sugars were greater  then 200 or persistently greater then 150.   ACTIVITY:  No lifting or straining for six weeks, no driving for four weeks.   DIET:  High fiber and high water.   WOUND CARE:  Shower, but no bath.   FOLLOWUP:  The patient was to return for her staples to be removed on  Tuesday, and to see Dr. Stanford Breed on Wednesday, February 25, 2003, for  follow up of the pathology and recommendations for further therapy.                                               Carrington Clamp, M.D.    MH/MEDQ  D:  04/10/2003  T:  04/10/2003  Job:  [id number]

## 2011-03-17 NOTE — Consult Note (Signed)
NAMERUBI, TOOLEY NO.:  [id number]   MEDICAL RECORD NO.:  [id number]          PATIENT TYPE:  OUT   LOCATION:  GYN                          FACILITY:  [facility]   PHYSICIAN:  De Blanch, M.D.DATE OF BIRTH:  1925-02-02   DATE OF CONSULTATION:  DATE OF DISCHARGE:                                   CONSULTATION   REASON FOR CONSULTATION:  A 77 year old white female who returns for  continuing follow up of a stage 1A grade III ovarian cancer.   INTERVAL HISTORY:  Since her last visit the patient had done well.  She  denies any GI or GU symptoms.  She has no pelvic pain, pressure, or vaginal  discharge.  She did develop some lethargy and dizziness and was found to  have a coronary occlusion and has had a stent placed with resolution of her  symptoms.   HISTORY OF PRESENT ILLNESS:  The patient underwent initial surgery for stage  1A grade III ovarian in April 2004.  She received three cycles of adjuvant  carboplatin and Taxol chemotherapy.  At the completion of chemotherapy her  CA-125 was 20.   PAST MEDICAL HISTORY:  1.  Coronary artery disease.  2.  Peripheral vascular disease.  3.  Diabetes.   PAST SURGICAL HISTORY:  1.  Angioplasty right carotid endarterectomy.  2.  Ovarian cystectomy.  3.  Tonsillectomy and adenoidectomy.  4.  Appendectomy.  5.  Ovarian cancer resection with staging in 2004.  6.  Coronary artery stent placement 2005.   OBSTETRICAL HISTORY:  Gravida 2.   FAMILY HISTORY:  The patient has an aunt with breast cancer.  There is no  other family history of ovarian or colon cancer.   DRUG ALLERGIES:  None.   REVIEW OF SYSTEMS:  Negative except as noted above.   PHYSICAL EXAMINATION:  VITAL SIGNS:  Weight 129 pounds (stable).  Blood  pressure 130/70.  GENERAL:  The patient is a healthy white female in no acute distress.  HEENT:  Negative.  NECK:  Supple without thyromegaly.  LYMPH NODES:  There is no supraclavicular or inguinal  adenopathy.  ABDOMEN:  Soft, nontender.  No masses, organomegaly, ascites, or hernias are  noted.  PELVIC:  EGVUS.  Vagina, bladder, and urethra are normal.  The vagina is  well supported.  No lesions noted.  Bimanual and rectovaginal exam reveal no  masses, induration, or nodularity.  EXTREMITIES:  The lower extremities are without edema or varicosities.   IMPRESSION:  Stage 1A grade III ovarian cancer.  No evidence of recurrent  disease.   PLAN:  A CA-125 was repeated earlier this week.  It was 16 units/ml  (stable).  The patient will return to see Dr. Darrold Span in February 2006 and  return to see me in May 2006.     Dani   DC/MEDQ  D:  08/31/2004  T:  08/31/2004  Job:  [id number]   cc:   Lennis P. Darrold Span, M.D.  [address] [facility]  [place]  [place] [address]  Fax: [phone]   Carrington Clamp, M.D.  [address]  [address]  [address]  [place], [place] [address]  Fax: [phone]   Eugenio Hoes. Tawanna Cooler, M.D. [facility]   Salvadore Farber, M.D. [facility]  [address]  [address]  [place]  [place] [address]   Telford Nab, R.N.  [phone]  [place], [place] [address]

## 2011-03-30 ENCOUNTER — Other Ambulatory Visit: Payer: Self-pay | Admitting: *Deleted

## 2011-03-30 ENCOUNTER — Telehealth: Payer: Self-pay | Admitting: Cardiology

## 2011-03-30 MED ORDER — ZOLPIDEM TARTRATE 5 MG PO TABS: 5.0000 mg | ORAL_TABLET | Freq: Every evening | ORAL | Status: DC | PRN

## 2011-03-30 NOTE — Telephone Encounter (Signed)
Pt states she needs a new prescription for metoprolol 100 mg  90 days supply to be call in to [facility] # [phone]

## 2011-03-31 MED ORDER — METOPROLOL TARTRATE 100 MG PO TABS: 100.0000 mg | ORAL_TABLET | ORAL | Status: DC

## 2011-03-31 NOTE — Telephone Encounter (Signed)
RX sent into pharmacy. Pt notified. 

## 2011-04-04 ENCOUNTER — Telehealth: Payer: Self-pay | Admitting: Cardiology

## 2011-04-04 NOTE — Telephone Encounter (Signed)
Ref# [id number] they need clarification is it 100mg  or 25mg   For metoprolol

## 2011-04-05 NOTE — Telephone Encounter (Signed)
Metoprolol 100 mg  [facility] [phone]. Ref # [id number]

## 2011-04-06 NOTE — Telephone Encounter (Signed)
RX clarified for metoprolol 100mg  1/2 po bid

## 2011-05-31 ENCOUNTER — Encounter: Payer: Self-pay | Admitting: Gastroenterology

## 2011-08-11 ENCOUNTER — Other Ambulatory Visit: Payer: Self-pay | Admitting: Cardiology

## 2011-08-11 DIAGNOSIS — I6529 Occlusion and stenosis of unspecified carotid artery: Secondary | ICD-10-CM

## 2011-08-14 ENCOUNTER — Encounter (INDEPENDENT_AMBULATORY_CARE_PROVIDER_SITE_OTHER): Payer: Medicare Other | Admitting: *Deleted

## 2011-08-14 DIAGNOSIS — I6529 Occlusion and stenosis of unspecified carotid artery: Secondary | ICD-10-CM

## 2011-10-09 ENCOUNTER — Telehealth: Payer: Self-pay | Admitting: Family Medicine

## 2011-10-09 NOTE — Telephone Encounter (Signed)
ok 

## 2011-10-09 NOTE — Telephone Encounter (Signed)
Pt was last seen 09-09-2008. Can I re-est pt?

## 2011-10-09 NOTE — Telephone Encounter (Signed)
Pt is aware ov made

## 2011-10-20 ENCOUNTER — Telehealth: Payer: Self-pay | Admitting: Family Medicine

## 2011-10-20 MED ORDER — ZOLPIDEM TARTRATE 5 MG PO TABS: 5.0000 mg | ORAL_TABLET | Freq: Every evening | ORAL | Status: DC | PRN

## 2011-10-20 NOTE — Telephone Encounter (Signed)
rx called in

## 2011-10-20 NOTE — Telephone Encounter (Signed)
Refill Zolpidem to [facility]. Thanks.

## 2011-11-01 ENCOUNTER — Encounter: Payer: Self-pay | Admitting: Family Medicine

## 2011-11-01 ENCOUNTER — Telehealth: Payer: Self-pay | Admitting: *Deleted

## 2011-11-01 NOTE — Telephone Encounter (Signed)
Patient was a walk-in complaining of elevated blood pressure and frequent urination.  She explained that she has had a headache and dysuria for about 2 days. BP 124/86 P 80 Temp 98.3  A urine was processed and a culture was ordered.  Her pharmacy is [facility] [address].  Patient has gone home.

## 2011-11-07 ENCOUNTER — Ambulatory Visit (INDEPENDENT_AMBULATORY_CARE_PROVIDER_SITE_OTHER): Payer: Medicare Other | Admitting: Family Medicine

## 2011-11-07 ENCOUNTER — Encounter: Payer: Self-pay | Admitting: Family Medicine

## 2011-11-07 ENCOUNTER — Ambulatory Visit (INDEPENDENT_AMBULATORY_CARE_PROVIDER_SITE_OTHER)
Admission: RE | Admit: 2011-11-07 | Discharge: 2011-11-07 | Disposition: A | Discharge status: Home / Self Care | Payer: Medicare Other | Source: Ambulatory Visit | Attending: Family Medicine | Admitting: Family Medicine

## 2011-11-07 DIAGNOSIS — R634 Abnormal weight loss: Secondary | ICD-10-CM

## 2011-11-07 DIAGNOSIS — C569 Malignant neoplasm of unspecified ovary: Secondary | ICD-10-CM

## 2011-11-07 DIAGNOSIS — E119 Type 2 diabetes mellitus without complications: Secondary | ICD-10-CM

## 2011-11-07 DIAGNOSIS — M549 Dorsalgia, unspecified: Secondary | ICD-10-CM

## 2011-11-07 MED ORDER — TRAMADOL HCL 50 MG PO TABS: ORAL_TABLET | ORAL | Status: DC

## 2011-11-07 NOTE — Progress Notes (Signed)
  Subjective:    Patient ID: [name], female    DOB: 1925/10/22, 78 y.o.   MRN: [id number]  HPI [name] is a 78 year old, widowed female, who comes in today as a new patient........ I last saw her about 4 years ago.  Since that, time.  She's been getting her medical care from her endocrinologist, Dr. Steva Ready. And her cardiologist, Dr. Rexene Edison........... She's here today because she is frustrated about weight loss.  She talked to her endocrinologist in December at that time.  Her weight was 8 pounds and her hemoglobin A1c was 5.6%.  Fasting blood sugar 111.  She denies any history of hypoglycemic episodes.  Liver functions normal.  Lipids normal renal function normal.  Urinalysis normal.  Microalbumin 8.8.  However, urinalysis shows no protein.  She denies any fevers or chills, earache, sore throat, cough, nausea, vomiting, diarrhea, or urinary tract symptoms.  About 10 years ago.  She had a right ovarian lesion that turned out to be a carcinoma.  Her uterus and ovaries were, removed.  She's had no recurrence.    The endocrinologist stopped her metformin because her blood sugar was so low.  She is currently only taking januvia 100 mg daily.   Review of Systems  Constitutional: Positive for unexpected weight change.  HENT: Negative.   Eyes: Negative.   Respiratory: Negative.   Cardiovascular: Negative.   Gastrointestinal: Negative.   Genitourinary: Negative.   Musculoskeletal: Negative.   Neurological: Negative.   Hematological: Negative.   Psychiatric/Behavioral: Negative.        Objective:   Physical Exam  Constitutional: She is oriented to person, place, and time. She appears well-developed and well-nourished. No distress.  Neck: Normal range of motion. Neck supple. No JVD present. No tracheal deviation present. No thyromegaly present.  Cardiovascular: Normal rate, regular rhythm and intact distal pulses.  Exam reveals no gallop and no friction rub.   Murmur heard. Pulmonary/Chest:  Effort normal and breath sounds normal. No stridor. No respiratory distress. She has no wheezes. She has no rales. She exhibits no tenderness.  Abdominal: Soft. Bowel sounds are normal. She exhibits no distension and no mass. There is no tenderness. There is no rebound and no guarding.  Lymphadenopathy:    She has no cervical adenopathy.  Neurological: She is alert and oriented to person, place, and time.  Skin: She is not diaphoretic.       Skin exam normal except for scar in the midline from previous total abdominal hysterectomy for ovarian cancer in a          Assessment & Plan:  Weight loss, question etiology.  Plan evaluation, which will include lab work.  Also, stop her diabetic medication, because her A1c is 5.6%.

## 2011-11-07 NOTE — Patient Instructions (Signed)
Stop the diabetic medication.  Labs today.  Fasting blood sugar daily in the morning.  Return Thursday afternoon at 4 p.m. For follow-up in

## 2011-11-09 ENCOUNTER — Ambulatory Visit (INDEPENDENT_AMBULATORY_CARE_PROVIDER_SITE_OTHER): Payer: Medicare Other | Admitting: Family Medicine

## 2011-11-09 ENCOUNTER — Encounter: Payer: Self-pay | Admitting: Family Medicine

## 2011-11-09 DIAGNOSIS — R634 Abnormal weight loss: Secondary | ICD-10-CM

## 2011-11-09 DIAGNOSIS — E119 Type 2 diabetes mellitus without complications: Secondary | ICD-10-CM

## 2011-11-09 LAB — CBC WITH DIFFERENTIAL/PLATELET
Basophils Absolute: 0 10*3/uL (ref 0.0–0.1)
Eosinophils Absolute: 0.3 10*3/uL (ref 0.0–0.7)
Lymphocytes Relative: 18.3 % (ref 12.0–46.0)
MCHC: 33 g/dL (ref 30.0–36.0)
Neutro Abs: 8.9 10*3/uL — ABNORMAL HIGH (ref 1.4–7.7)
Neutrophils Relative %: 71.8 % (ref 43.0–77.0)
Platelets: 252 10*3/uL (ref 150.0–400.0)
RDW: 14.8 % — ABNORMAL HIGH (ref 11.5–14.6)

## 2011-11-09 LAB — TSH: TSH: 2.44 u[IU]/mL (ref 0.35–5.50)

## 2011-11-09 LAB — CA 125: CA 125: 10.6 U/mL (ref 0.0–30.2)

## 2011-11-09 LAB — T3, FREE: T3, Free: 3.1 pg/mL (ref 2.3–4.2)

## 2011-11-09 NOTE — Patient Instructions (Signed)
Continue to check a fasting blood sugar daily in the morning.  The goal is to keep her blood sugar under 150.   Also, stop the Zocor temporarily,  I will call you and I get your reports back next week to

## 2011-11-09 NOTE — Progress Notes (Signed)
  Subjective:    Patient ID: [name], female    DOB: December 30, 1924, 78 y.o.   MRN: [id number]  HPI colllen is a 78 year old widowed female, nonsmoker, who comes in today for follow-up of weight loss.  We saw her the other day, and we began a diagnostic evaluation, which included x-rays of her hips and pelvis because of the back pain.  X-ray shows no evidence of any lytic lesions.  Labs were lost at the office therefore, had to be redrawn today.  We stopped her final blood sugar lowering medication. Januvia.........Marland Kitchen And her blood sugar is stable at 120 to 130 fasting.  It's a little too early to tell if the medication was indeed the culprit in her weight loss.  We will redraw her labs at no expense to her   Review of Systems . General and metabolic review of systems otherwise negative    Objective:   Physical Exam Thin female, in no acute distress       Assessment & Plan:  Weight loss, unknown etiology, probably secondary to medication.  No evidence of any underlying malignancy.  Plan.  Check labs.  Follow-up next week.  Also hold the Zocor and an

## 2011-11-27 ENCOUNTER — Telehealth: Payer: Self-pay | Admitting: Family Medicine

## 2011-11-27 NOTE — Telephone Encounter (Signed)
Pt is still having back/neck pain. Pt need test result.

## 2011-11-28 NOTE — Telephone Encounter (Signed)
Fleet Contras please call and find out what's going on here

## 2011-11-29 NOTE — Telephone Encounter (Signed)
Patient is still having neck and back pain and has not gained any more weight.  An appointment was made for Monday She also would like her lab results.

## 2011-11-30 NOTE — Telephone Encounter (Signed)
[name],,,,,,,,, please refer her for a neurologic evaluation,,,,,,,,, or if she seen a neurologist previously then she needs to make a followup appointment or if she seen one of the rheumatologists

## 2011-11-30 NOTE — Telephone Encounter (Signed)
Patient is not willing to go to neurology at this time she would like to keep her appointment on Monday.

## 2011-12-04 ENCOUNTER — Ambulatory Visit (INDEPENDENT_AMBULATORY_CARE_PROVIDER_SITE_OTHER): Payer: Medicare Other | Admitting: Family Medicine

## 2011-12-04 ENCOUNTER — Encounter: Payer: Self-pay | Admitting: Family Medicine

## 2011-12-04 DIAGNOSIS — E119 Type 2 diabetes mellitus without complications: Secondary | ICD-10-CM

## 2011-12-04 DIAGNOSIS — R634 Abnormal weight loss: Secondary | ICD-10-CM

## 2011-12-04 DIAGNOSIS — I10 Essential (primary) hypertension: Secondary | ICD-10-CM

## 2011-12-04 LAB — BASIC METABOLIC PANEL
BUN: 18 mg/dL (ref 6–23)
Calcium: 9.5 mg/dL (ref 8.4–10.5)
Creatinine, Ser: 0.9 mg/dL (ref 0.4–1.2)
GFR: 64.14 mL/min (ref >60.00)

## 2011-12-04 LAB — MICROALBUMIN / CREATININE URINE RATIO: Microalb, Ur: 42.6 mg/dL — ABNORMAL HIGH (ref 0.0–1.9)

## 2011-12-04 NOTE — Patient Instructions (Signed)
Stop the Lasix and the Altase  Decrease the Lopressor to one half tablet daily in the morning  Continue the nifedipine one tablet daily in the morning  Check your blood pressure daily in the morning and return in 2 weeks for followup  We will get you set up a consult with Dr. Denton Meek to see if he can help [place] determine etiology of the neck and back pain

## 2011-12-04 NOTE — Progress Notes (Signed)
  Subjective:    Patient ID: Jannet Askew, female    DOB: [birth year], 78 y.o.   MRN: [id number]  HPI coleen is an 78 year old female who comes in today for followup of hypertension and diabetes and neck and back pain and weight loss  We have stopped her metformin because her blood sugar now is normal 113 off of medication. Her weight has dropped to 80 pounds. Her blood pressure today is 118/58 on Lasix Lopressor nifedipine and Altase. We will begin to taper her blood pressure medications slowly. She states over the past 2 years she's lost 41 pounds. She also complains of chronic neck and back pain. We did some x-rays of her hips and back and they were normal. She's also had a history of her ovarian cancer no evidence of metastatic disease CEA 125 within normal limits.   Review of Systems General and metabolic review of systems otherwise negative    Objective:   Physical Exam  Well-developed well-nourished female in no acute distress blood sugar this morning fasting 113      Assessment & Plan:  Diabetes with normal blood sugar off medicine continue to monitor sugar  Hypertension blood pressure too low plan DC Lasix cut the Lopressor in half stop the Altace continue the nifedipine BP check every morning followup in 2 weeks  Neck and back pain unknown etiology plan neurologic consult for evaluation Dr. Denton Meek

## 2011-12-19 ENCOUNTER — Other Ambulatory Visit: Payer: Self-pay | Admitting: Physician Assistant

## 2011-12-19 ENCOUNTER — Ambulatory Visit (INDEPENDENT_AMBULATORY_CARE_PROVIDER_SITE_OTHER): Payer: Medicare Other | Admitting: Family Medicine

## 2011-12-19 ENCOUNTER — Encounter: Payer: Self-pay | Admitting: Family Medicine

## 2011-12-19 VITALS — BP 180/60 | Temp 97.9°F | Wt 79.0 lb

## 2011-12-19 DIAGNOSIS — I10 Essential (primary) hypertension: Secondary | ICD-10-CM

## 2011-12-19 NOTE — Progress Notes (Signed)
  Subjective:    Patient ID: Jannet Askew, female    DOB: October 02, 1925, 78 y.o.   MRN: [id number]  HPI   Miosotis is a 78 year old female who comes in today for followup of hypertension  Her current medications are Altase 10 mg daily, nifedipine 90 mg daily, Lopressor 50 mg twice a day. We stopped the Lasix because her weight was down and indeed today she 79 pounds.  We also stopped her diabetic medication blood sugar at home is in the 110 range no medication.  BP today 180/60  I think at this juncture I will call Dr. Nickie Retort. and request a consult   Review of Systems    general cardiovascular review of systems otherwise negative Objective:   Physical Exam Well-developed very thin female BP right arm sitting position 180/60       Assessment & Plan:  Hypertension not at goal consult with Dr. Leta Jungling  Diabetes type 2 at goal off medications  Weight loss unknown etiology

## 2011-12-19 NOTE — Patient Instructions (Signed)
Continue your current medications  Cardiology will call you with an appointment to see Dr. Nickie Retort. ASAP

## 2011-12-20 ENCOUNTER — Encounter: Payer: Self-pay | Admitting: Physician Assistant

## 2011-12-20 ENCOUNTER — Ambulatory Visit (INDEPENDENT_AMBULATORY_CARE_PROVIDER_SITE_OTHER): Payer: Medicare Other | Admitting: Physician Assistant

## 2011-12-20 VITALS — BP 166/71 | HR 70 | Ht 60.0 in | Wt 79.0 lb

## 2011-12-20 DIAGNOSIS — I2510 Atherosclerotic heart disease of native coronary artery without angina pectoris: Secondary | ICD-10-CM

## 2011-12-20 DIAGNOSIS — R002 Palpitations: Secondary | ICD-10-CM

## 2011-12-20 DIAGNOSIS — I10 Essential (primary) hypertension: Secondary | ICD-10-CM

## 2011-12-20 DIAGNOSIS — R634 Abnormal weight loss: Secondary | ICD-10-CM

## 2011-12-20 MED ORDER — METOPROLOL TARTRATE 50 MG PO TABS: 75.0000 mg | ORAL_TABLET | Freq: Two times a day (BID) | ORAL | Status: DC

## 2011-12-20 NOTE — Patient Instructions (Signed)
Your physician recommends that you schedule a follow-up appointment in: 2-3 WEEKS DR. [facility]  Your physician has recommended you make the following change in your medication: CHANGE METOPROLOL TO 75 MG TWICE DAILY A NEW PRESCRIPTION HAS BEEN SENT IN FOR YOU TODAY.  Your physician has requested that you have an echocardiogram DX 401.1 HTN, 414.01 CAD. Echocardiography is a painless test that uses sound waves to create images of your heart. It provides your doctor with information about the size and shape of your heart and how well your heart's chambers and valves are working. This procedure takes approximately one hour. There are no restrictions for this procedure.   A chest x-ray DX WEIGHT LOSS 783.21 THIS WILL BE DONE @ [facility] takes a picture of the organs and structures inside the chest, including the heart, lungs, and blood vessels. This test can show several things, including, whether the heart is enlarges; whether fluid is building up in the lungs; and whether pacemaker / defibrillator leads are still in place.  Your physician has recommended that you wear a 48 HOUR holter monitor DX 785.1 PALPITATIONS. Holter monitors are medical devices that record the heart's electrical activity. Doctors most often use these monitors to diagnose arrhythmias. Arrhythmias are problems with the speed or rhythm of the heartbeat. The monitor is a small, portable device. You can wear one while you do your normal daily activities. This is usually used to diagnose what is causing palpitations/syncope (passing out).  Your physician has requested that you have a renal artery duplex DX HTN 401.1, CAD 414.01. During this test, an ultrasound is used to evaluate blood flow to the kidneys. Allow one hour for this exam. Do not eat after midnight the day before and avoid carbonated beverages. Take your medications as you usually do.

## 2011-12-20 NOTE — Progress Notes (Signed)
[address] [place], [place]  [address] Phone: [phone] Fax:  [phone]  Date:  12/20/2011   Name:  [name]       DOB:  Jul 04, 1925 MRN:  [id number]  PCP:  Dr. Tawanna Cooler  Primary Cardiologist:  Dr. [name]  Primary Electrophysiologist:  None    History of Present Illness: [name] is a 78 y.o. female who is referred back by Dr. Tawanna Cooler for BP.  She has a history of CAD, diabetes, hyperlipidemia, hypertension, depression, ovarian cancer and PAD.  She was treated with streptokinase for an inferior MI in the past.  She essentially had bare-metal stenting to the RCA and subsequent brachytherapy for in-stent restenosis.  She has a history of bare-metal stent to the mid LAD.  Last [facility] 9/05:  ostial LAD 25%, mid 25%, ostial diagonal 30%, proximal circumflex 25%, ostial OM1 25%, mid RCA 99% in-stent restenosis, then 50 and 40%.  PCI: Taxus DES to the mid RCA.  Last Myoview 5/12: EF 69%, inferior scar with minimal peri-infarct ischemia.  Last echo 2/03: EF 45-55%.  She is status post right carotid endarterectomy in 2000 and she has a known occluded LICA.  Dopplers 10/12: RICA 40-59%, right CEA okay, LICA 100%-followup one year.  She saw her PCP yesterday.  Blood pressure was 180/60 (Altace, nifedipine, Lopressor).  She notes a 40 pound weight loss over the last year.  Her blood pressure has been difficult to control for the last 6 months.  She saw Dr. Tawanna Cooler in January.  She had extensive lab work including a comprehensive metabolic panel, CBC, TSH and CA 125.  Findings were unremarkable.  She had pelvic x-rays the were done in the setting of hip pain and she had no evidence of lytic lesions.  She is weak, lightheaded and has no energy.  She noted increased heart rate with activity as well as in the evenings.  She denies orthopnea, PND or edema.  She describes chronic class 2-2b symptoms.  She has occasional chest pains.  She denies exertional chest  discomfort.  She denies syncope.  Past Medical History  Diagnosis Date  . CAD (coronary artery disease)     prior stent to the LAD and RCA;  Last [facility] 9/05:  ostial LAD 25%, mid 25%, ostial diagonal 30%, proximal circumflex 25%, ostial OM1 25%, mid RCA 99% in-stent restenosis, then 50 and 40%.  PCI: Taxus DES to the mid RCA.  Last Myoview 5/12: EF 69%, inferior scar with minimal peri-infarct ischemia.  Last echo 2/03: EF 45-55%.  . Depression   . DM (diabetes mellitus)   . Hyperlipidemia   . Colon polyp   . Hyperplastic rectal polyp   . Ovarian cancer   . Cataract   . Bowel obstruction   . H/O: hysterectomy   . HTN (hypertension)   . Carotid stenosis     s/p right CEA 2000;  Dopplers 10/12: RICA 40-59%, right CEA okay, LICA 100%-followup one year    Current Outpatient Prescriptions  Medication Sig Dispense Refill  . aspirin 81 MG tablet Take 81 mg by mouth daily.        . Calcium Carbonate-Vitamin D (CALCIUM-VITAMIN D) 600-200 MG-UNIT CAPS Take by mouth.        . fish oil-omega-3 fatty acids 1000 MG capsule 1 tab po qd       . metoprolol (LOPRESSOR) 100 MG tablet Take 1 tablet (100 mg total) by mouth as directed. 1/2 tablet in the  morning and 1/2 tablet in the evening  90 tablet  2  . Multiple Vitamin (MULTIVITAMIN) tablet Take 1 tablet by mouth daily.        Marland Kitchen NIFEdipine (ADALAT CC) 90 MG 24 hr tablet Take 1 tablet (90 mg total) by mouth daily.  90 tablet  3  . nitroGLYCERIN (NITROSTAT) 0.4 MG SL tablet Place 1 tablet (0.4 mg total) under the tongue every 5 (five) minutes as needed for chest pain.  90 tablet  12  . ramipril (ALTACE) 10 MG capsule Take 1 capsule (10 mg total) by mouth daily.  90 capsule  3  . zolpidem (AMBIEN) 5 MG tablet Take 1 tablet (5 mg total) by mouth at bedtime as needed.  30 tablet  5    Allergies: No Known Allergies  History  Substance Use Topics  . Smoking status: Current Some Day Smoker  . Smokeless tobacco: Not on file  . Alcohol Use: No     ROS:   Please see the history of present illness.   She denies fevers, night sweats, vomiting, diarrhea, cough, melena, hematochezia.  She denies flushing.  She denies salt indiscretion.  She has a good appetite.   All other systems reviewed and negative.   PHYSICAL EXAM: VS:  BP 166/71  Pulse 70  Ht 5' (1.524 m)  Wt 79 lb (35.834 kg)  BMI 15.43 kg/m2 Repeat by me on the right: 160/60  Cachectic female, in no acute distress HEENT: normal Neck: no JVD, Right CEA scar noted Endocrine: No thyromegaly Cardiac:  normal S1, S2; RRR; no murmur Lungs:  Decreased breath sounds bilaterally, no rales, no wheezing Abd: soft, nontender, no hepatomegaly, No bruits Ext: no edema Skin: warm and dry Neuro:  CNs 2-12 intact, no focal abnormalities noted Psych: Normal affect  EKG:  Sinus rhythm, heart rate 70, normal axis, incomplete right bundle branch block, no change from prior  ASSESSMENT AND PLAN:

## 2011-12-20 NOTE — Assessment & Plan Note (Signed)
Recent myoview low risk.  She has had some atypical chest pains.  Overall feels poorly given her weight loss.  Obtain an echo.  If her EF is down, we will need to talk with her about proceeding with cardiac cath. She would like to avoid if possible.  Follow up with Dr. [name] as noted.  Continue ASA.

## 2011-12-20 NOTE — Assessment & Plan Note (Signed)
Unexplained.  She's had a fairly extensive workup recently.  I will go ahead and send her for a chest x-ray as well given her long-term smoking history.

## 2011-12-20 NOTE — Assessment & Plan Note (Signed)
Unexplained.  These occur every day.  Increase metoprolol as noted.  Check an echocardiogram.  Obtain a 48-hour Holter.  Followup with Dr. [name] in 2-3 weeks.

## 2011-12-20 NOTE — Assessment & Plan Note (Signed)
Difficult to control over the last several months.  She is on multiple medications.  I consider changing her metoprolol to carvedilol.  However, she is a long-time smoker and I presume that she has underlying COPD.  Therefore, I chose not to change her to carvedilol for fear of causing bronchospasm.  She does have palpitations.  I will go ahead and increase her metoprolol to 75 mg twice a day.  I will obtain an echocardiogram.  I will also arrange renal arterial Dopplers to rule out the possibility of renal artery stenosis given her significant vascular disease.  Recent creatinine was stable.  At this point, I do not think it is necessary to proceed with a 24-hour urine to screen for pheochromocytoma.  I think this is less likely to be a cause of her current problem.  We can certainly consider this in the future if her pressure remains difficult to control.  Followup in 2-3 weeks with Dr. [name].

## 2011-12-22 ENCOUNTER — Telehealth: Payer: Self-pay | Admitting: *Deleted

## 2011-12-22 NOTE — Telephone Encounter (Signed)
Left message for patient to call and schedule 48 hour holter monitor.

## 2011-12-27 ENCOUNTER — Encounter (INDEPENDENT_AMBULATORY_CARE_PROVIDER_SITE_OTHER): Payer: Medicare Other

## 2011-12-27 ENCOUNTER — Other Ambulatory Visit: Payer: Self-pay

## 2011-12-27 ENCOUNTER — Ambulatory Visit (HOSPITAL_COMMUNITY): Payer: Medicare Other | Attending: Cardiology

## 2011-12-27 DIAGNOSIS — R072 Precordial pain: Secondary | ICD-10-CM | POA: Insufficient documentation

## 2011-12-27 DIAGNOSIS — I379 Nonrheumatic pulmonary valve disorder, unspecified: Secondary | ICD-10-CM | POA: Insufficient documentation

## 2011-12-27 DIAGNOSIS — I080 Rheumatic disorders of both mitral and aortic valves: Secondary | ICD-10-CM | POA: Insufficient documentation

## 2011-12-27 DIAGNOSIS — I10 Essential (primary) hypertension: Secondary | ICD-10-CM | POA: Insufficient documentation

## 2011-12-27 DIAGNOSIS — E785 Hyperlipidemia, unspecified: Secondary | ICD-10-CM | POA: Insufficient documentation

## 2011-12-27 DIAGNOSIS — E119 Type 2 diabetes mellitus without complications: Secondary | ICD-10-CM | POA: Insufficient documentation

## 2011-12-27 DIAGNOSIS — R002 Palpitations: Secondary | ICD-10-CM

## 2011-12-27 DIAGNOSIS — I2510 Atherosclerotic heart disease of native coronary artery without angina pectoris: Secondary | ICD-10-CM | POA: Insufficient documentation

## 2011-12-27 DIAGNOSIS — I079 Rheumatic tricuspid valve disease, unspecified: Secondary | ICD-10-CM | POA: Insufficient documentation

## 2011-12-28 ENCOUNTER — Telehealth: Payer: Self-pay | Admitting: *Deleted

## 2011-12-28 NOTE — Telephone Encounter (Signed)
Message copied by Tarri Fuller on Thu Dec 28, 2011  9:35 AM ------      Message from: [place], [place] T      Created: Thu Dec 28, 2011  8:19 AM       Normal LV function      Tereso Newcomer, [place]  8:19 AM 12/28/2011

## 2011-12-28 NOTE — Telephone Encounter (Signed)
Line busy. [name]

## 2012-01-15 ENCOUNTER — Ambulatory Visit (INDEPENDENT_AMBULATORY_CARE_PROVIDER_SITE_OTHER): Payer: Medicare Other | Admitting: Cardiology

## 2012-01-15 ENCOUNTER — Encounter: Payer: Self-pay | Admitting: Cardiology

## 2012-01-15 ENCOUNTER — Encounter (INDEPENDENT_AMBULATORY_CARE_PROVIDER_SITE_OTHER): Payer: Medicare Other

## 2012-01-15 VITALS — BP 130/64 | HR 70 | Ht 59.0 in | Wt 80.0 lb

## 2012-01-15 DIAGNOSIS — I10 Essential (primary) hypertension: Secondary | ICD-10-CM

## 2012-01-15 DIAGNOSIS — Z720 Tobacco use: Secondary | ICD-10-CM

## 2012-01-15 DIAGNOSIS — F17200 Nicotine dependence, unspecified, uncomplicated: Secondary | ICD-10-CM

## 2012-01-15 DIAGNOSIS — R002 Palpitations: Secondary | ICD-10-CM

## 2012-01-15 MED ORDER — METOPROLOL TARTRATE 50 MG PO TABS: 75.0000 mg | ORAL_TABLET | Freq: Two times a day (BID) | ORAL | Status: DC

## 2012-01-15 MED ORDER — SIMVASTATIN 20 MG PO TABS: 20.0000 mg | ORAL_TABLET | Freq: Every evening | ORAL | Status: DC

## 2012-01-15 MED ORDER — NIFEDIPINE ER 90 MG PO TB24: 90.0000 mg | ORAL_TABLET | Freq: Every day | ORAL | Status: DC

## 2012-01-15 MED ORDER — RAMIPRIL 10 MG PO CAPS: 10.0000 mg | ORAL_CAPSULE | Freq: Every day | ORAL | Status: DC

## 2012-01-15 NOTE — Assessment & Plan Note (Signed)
He has a chronically occluded left carotid and 60-79% right. She is due for followup in October. She will continue risk reduction. 

## 2012-01-15 NOTE — Assessment & Plan Note (Signed)
She was taken off of statins to see if this would help with weight loss. She says it made no difference. I will take the liberty of restarting this at its previous dose. She did have a lipid and liver in about 8 weeks.

## 2012-01-15 NOTE — Assessment & Plan Note (Signed)
She says that she is no longer smoking though she smells of cigarette smoke and she lives alone.

## 2012-01-15 NOTE — Progress Notes (Signed)
HPI The patient presents for followup of her known coronary disease. She has carotid disease which we are actively following as well. Since I last saw her she wore a Holter monitor which demonstrated normal sinus rhythm, occasional supraventricular ectopic beats, rare PVCs, a brief run of atrial arrhythmias/SVT and bradycardia not sustained.  At the last visit she did have her metoprolol increased. She thinks this helped and she's not having the pounding heartbeats that she was having. She's not having any presyncope or syncope. She's not having any typical chest pressure, neck or arm discomfort though she occasionally gets some fleeting chest discomfort. She's not sure whether this is similar to previous symptoms that she cannot recall the events surrounding her previous intervention. Of note she had an echo demonstrating a normal EF with some mild pulmonary hypertension.  No Known Allergies  Current Outpatient Prescriptions  Medication Sig Dispense Refill  . aspirin 81 MG tablet Take 81 mg by mouth daily.        . Calcium Carbonate-Vitamin D (CALCIUM-VITAMIN D) 600-200 MG-UNIT CAPS Take by mouth.        . fish oil-omega-3 fatty acids 1000 MG capsule 1 tab po qd       . metoprolol (LOPRESSOR) 50 MG tablet Take 1.5 tablets (75 mg total) by mouth 2 (two) times daily.  90 tablet  11  . Multiple Vitamin (MULTIVITAMIN) tablet Take 1 tablet by mouth daily.        Marland Kitchen NIFEdipine (ADALAT CC) 90 MG 24 hr tablet Take 1 tablet (90 mg total) by mouth daily.  90 tablet  3  . nitroGLYCERIN (NITROSTAT) 0.4 MG SL tablet Place 1 tablet (0.4 mg total) under the tongue every 5 (five) minutes as needed for chest pain.  90 tablet  12  . ramipril (ALTACE) 10 MG capsule Take 1 capsule (10 mg total) by mouth daily.  90 capsule  3  . zolpidem (AMBIEN) 5 MG tablet Take 1 tablet (5 mg total) by mouth at bedtime as needed.  30 tablet  5    Past Medical History  Diagnosis Date  . CAD (coronary artery disease)     prior  stent to the LAD and RCA;  Last [facility] 9/05:  ostial LAD 25%, mid 25%, ostial diagonal 30%, proximal circumflex 25%, ostial OM1 25%, mid RCA 99% in-stent restenosis, then 50 and 40%.  PCI: Taxus DES to the mid RCA.  Last Myoview 5/12: EF 69%, inferior scar with minimal peri-infarct ischemia.  Last echo 2/03: EF 45-55%.  . Depression   . DM (diabetes mellitus)   . Hyperlipidemia   . Colon polyp   . Hyperplastic rectal polyp   . Ovarian cancer   . Cataract   . Bowel obstruction   . H/O: hysterectomy   . HTN (hypertension)   . Carotid stenosis     s/p right CEA 2000;  Dopplers 10/12: RICA 40-59%, right CEA okay, LICA 100%-followup one year    Past Surgical History  Procedure Date  . Angioplasty   . Abdominal hysterectomy     ROS:  As stated in the HPI and negative for all other systems.  PHYSICAL EXAM BP 130/64  Pulse 70  Ht 4\' 11"  (1.499 m)  Wt 80 lb (36.288 kg)  BMI 16.16 kg/m2 GENERAL:  Thin HEENT:  Pupils equal round and reactive, fundi not visualized, oral mucosa unremarkable NECK:  No jugular venous distention, waveform within normal limits, bilateral carotid bruit upstroke brisk and symmetric, no thyromegaly LYMPHATICS:  No cervical, inguinal adenopathy LUNGS:  Clear to auscultation bilaterally BACK:  No CVA tenderness CHEST:  Unremarkable HEART:  PMI not displaced or sustained,S1 and S2 within normal limits, no S3, no S4, no clicks, no rubs, no murmurs ABD:  Flat, positive bowel sounds normal in frequency in pitch, no bruits, no rebound, no guarding, no midline pulsatile mass, no hepatomegaly, no splenomegaly EXT:  2 plus pulses throughout, no edema, no cyanosis no clubbing, femoral bruit. SKIN:  No rashes no nodules NEURO:  Cranial nerves II through XII grossly intact, motor grossly intact throughout PSYCH:  Cognitively intact, oriented to person place and time   ASSESSMENT AND PLAN

## 2012-01-15 NOTE — Assessment & Plan Note (Signed)
The blood pressure is at target. No change in medications is indicated. We will continue with therapeutic lifestyle changes (TLC).  

## 2012-01-15 NOTE — Patient Instructions (Addendum)
Please restart your simvastatin 20 mg daily The current medical regimen is effective;  continue present plan and medications.  Follow up in 6 months with Dr Antoine Poche.  You will receive a letter in the mail 2 months before you are due.  Please call us when you receive this letter to schedule your follow up appointment.

## 2012-01-15 NOTE — Assessment & Plan Note (Signed)
She had a stress test in 2012. She currently isn't having any suggestive symptoms are really angina. No change in therapy is suggested. She will continue with medical management.

## 2012-01-15 NOTE — Assessment & Plan Note (Signed)
These are much improved. She will continue the meds as listed.

## 2012-04-17 ENCOUNTER — Other Ambulatory Visit: Payer: Self-pay | Admitting: *Deleted

## 2012-04-17 MED ORDER — ZOLPIDEM TARTRATE 5 MG PO TABS: 5.0000 mg | ORAL_TABLET | Freq: Every evening | ORAL | Status: DC | PRN

## 2012-04-29 ENCOUNTER — Telehealth: Payer: Self-pay | Admitting: Family Medicine

## 2012-04-29 ENCOUNTER — Encounter: Payer: Self-pay | Admitting: Internal Medicine

## 2012-04-29 ENCOUNTER — Ambulatory Visit (INDEPENDENT_AMBULATORY_CARE_PROVIDER_SITE_OTHER): Payer: Medicare Other | Admitting: Internal Medicine

## 2012-04-29 VITALS — BP 130/70 | HR 62 | Temp 98.4°F | Wt 78.0 lb

## 2012-04-29 DIAGNOSIS — R079 Chest pain, unspecified: Secondary | ICD-10-CM

## 2012-04-29 DIAGNOSIS — R0781 Pleurodynia: Secondary | ICD-10-CM | POA: Insufficient documentation

## 2012-04-29 DIAGNOSIS — R0602 Shortness of breath: Secondary | ICD-10-CM

## 2012-04-29 NOTE — Progress Notes (Signed)
Subjective:    Patient ID: [name], female    DOB: 11-30-1924, 78 y.o.   MRN: [id number]  HPI  78 year old white female with history of coronary artery disease, hypertension and chronic insomnia complains of left-sided rib pain. She fell down this morning after leaning forward to pick up an item off the floor. Patient reports hitting the edge of her washing machine. She has mild shortness of breath and pain in her left lower ribs with inhalation.  She lives alone.   She has supportive daughter that lives nearby.  Review of Systems  Denies vertigo, negative for dehydrating illness    Past Medical History  Diagnosis Date  . CAD (coronary artery disease)     prior stent to the LAD and RCA;  Last [facility] 9/05:  ostial LAD 25%, mid 25%, ostial diagonal 30%, proximal circumflex 25%, ostial OM1 25%, mid RCA 99% in-stent restenosis, then 50 and 40%.  PCI: Taxus DES to the mid RCA.  Last Myoview 5/12: EF 69%, inferior scar with minimal peri-infarct ischemia.  Last echo 2/03: EF 45-55%.  . Depression   . DM (diabetes mellitus)   . Hyperlipidemia   . Colon polyp   . Hyperplastic rectal polyp   . Ovarian cancer   . Cataract   . Bowel obstruction   . H/O: hysterectomy   . HTN (hypertension)   . Carotid stenosis     s/p right CEA 2000;  Dopplers 10/12: RICA 40-59%, right CEA okay, LICA 100%-followup one year    History   Social History  . Marital Status: Widowed    Spouse Name: N/A    Number of Children: N/A  . Years of Education: N/A   Occupational History  . Not on file.   Social History Main Topics  . Smoking status: Former Smoker    Quit date: 01/01/2012  . Smokeless tobacco: Not on file  . Alcohol Use: No  . Drug Use: No  . Sexually Active: Not on file   Other Topics Concern  . Not on file   Social History Narrative  . No narrative on file    Past Surgical History  Procedure Date  . Angioplasty   . Abdominal hysterectomy     Family History  Problem  Relation Age of Onset  . Cancer Other     colon    No Known Allergies  Current Outpatient Prescriptions on File Prior to Visit  Medication Sig Dispense Refill  . aspirin 81 MG tablet Take 81 mg by mouth daily.        . Calcium Carbonate-Vitamin D (CALCIUM-VITAMIN D) 600-200 MG-UNIT CAPS Take by mouth.        . fish oil-omega-3 fatty acids 1000 MG capsule 1 tab po qd       . metoprolol (LOPRESSOR) 50 MG tablet Take 1.5 tablets (75 mg total) by mouth 2 (two) times daily.  270 tablet  3  . Multiple Vitamin (MULTIVITAMIN) tablet Take 1 tablet by mouth daily.        Marland Kitchen NIFEdipine (ADALAT CC) 90 MG 24 hr tablet Take 1 tablet (90 mg total) by mouth daily.  90 tablet  3  . ramipril (ALTACE) 10 MG capsule Take 1 capsule (10 mg total) by mouth daily.  90 capsule  3  . simvastatin (ZOCOR) 20 MG tablet Take 1 tablet (20 mg total) by mouth every evening.  90 tablet  3  . zolpidem (AMBIEN) 5 MG tablet Take 1 tablet (5 mg total) by  mouth at bedtime as needed.  30 tablet  5  . DISCONTD: nitroGLYCERIN (NITROSTAT) 0.4 MG SL tablet Place 1 tablet (0.4 mg total) under the tongue every 5 (five) minutes as needed for chest pain.  90 tablet  12    BP 130/70  Pulse 62  Temp 98.4 F (36.9 C) (Oral)  Wt 78 lb (35.381 kg)  SpO2 95%  Blood pressure 130/70 sitting, 120/70 standing  Objective:   Physical Exam  Constitutional: She is oriented to person, place, and time.       Thin, frail 78 year old female  HENT:  Head: Normocephalic and atraumatic.  Right Ear: External ear normal.  Left Ear: External ear normal.  Mouth/Throat: Oropharynx is clear and moist.  Cardiovascular: Normal rate and regular rhythm.   Pulmonary/Chest: Effort normal and breath sounds normal.       Left lower rib tenderness,  no contusion  Abdominal: Bowel sounds are normal. There is no tenderness.  Neurological: She is alert and oriented to person, place, and time. No cranial nerve deficit.       Mild dizziness with leaning forward   Skin: Skin is warm and dry.  Psychiatric: Her behavior is normal.      Assessment & Plan:

## 2012-04-29 NOTE — Patient Instructions (Signed)
Please call our office if your symptoms do not improve or gets worse.  

## 2012-04-29 NOTE — Telephone Encounter (Signed)
  aller: Gale/Child; PCP: Artist Pais Doe-Hyun Molly Maduro); CB#: [phone]; Call regarding Verifying Appt.  Pt has appt today, 04/29/12 at 1500 with Artist Pais.

## 2012-04-29 NOTE — Assessment & Plan Note (Signed)
Patient likely has left lower fracture. She denies history of recurrent falls. Her symptoms occurred this morning after bending forward to reach over to pick up an item off the floor. Patient reports having tramadol 50 mg at home. Patient advised to use half tablet along with Tylenol 325 mg every 8-12 hours as needed. We discussed increased risk of pneumonia in patients with rib fracture.  If rib fracture confirmed, consider obtaining bone density scan.

## 2012-05-01 ENCOUNTER — Ambulatory Visit (INDEPENDENT_AMBULATORY_CARE_PROVIDER_SITE_OTHER)
Admission: RE | Admit: 2012-05-01 | Discharge: 2012-05-01 | Disposition: A | Discharge status: Home / Self Care | Payer: Medicare Other | Source: Ambulatory Visit | Attending: Internal Medicine | Admitting: Internal Medicine

## 2012-05-01 DIAGNOSIS — R079 Chest pain, unspecified: Secondary | ICD-10-CM

## 2012-05-01 DIAGNOSIS — R0602 Shortness of breath: Secondary | ICD-10-CM

## 2012-06-16 ENCOUNTER — Emergency Department (HOSPITAL_COMMUNITY): Payer: Medicare Other

## 2012-06-16 ENCOUNTER — Encounter (HOSPITAL_COMMUNITY): Payer: Self-pay | Admitting: Emergency Medicine

## 2012-06-16 ENCOUNTER — Inpatient Hospital Stay (HOSPITAL_COMMUNITY)
Admission: EM | Admit: 2012-06-16 | Discharge: 2012-06-19 | DRG: 305 | Disposition: A | Discharge status: Home / Self Care | Payer: Medicare Other | Attending: Internal Medicine | Admitting: Internal Medicine

## 2012-06-16 DIAGNOSIS — H8110 Benign paroxysmal vertigo, unspecified ear: Secondary | ICD-10-CM

## 2012-06-16 DIAGNOSIS — I2510 Atherosclerotic heart disease of native coronary artery without angina pectoris: Secondary | ICD-10-CM

## 2012-06-16 DIAGNOSIS — Z7901 Long term (current) use of anticoagulants: Secondary | ICD-10-CM

## 2012-06-16 DIAGNOSIS — F329 Major depressive disorder, single episode, unspecified: Secondary | ICD-10-CM | POA: Diagnosis present

## 2012-06-16 DIAGNOSIS — Z8543 Personal history of malignant neoplasm of ovary: Secondary | ICD-10-CM

## 2012-06-16 DIAGNOSIS — Z860100 Personal history of colon polyps, unspecified: Secondary | ICD-10-CM

## 2012-06-16 DIAGNOSIS — E785 Hyperlipidemia, unspecified: Secondary | ICD-10-CM

## 2012-06-16 DIAGNOSIS — I10 Essential (primary) hypertension: Principal | ICD-10-CM | POA: Diagnosis present

## 2012-06-16 DIAGNOSIS — F17200 Nicotine dependence, unspecified, uncomplicated: Secondary | ICD-10-CM | POA: Diagnosis present

## 2012-06-16 DIAGNOSIS — C569 Malignant neoplasm of unspecified ovary: Secondary | ICD-10-CM

## 2012-06-16 DIAGNOSIS — I160 Hypertensive urgency: Secondary | ICD-10-CM

## 2012-06-16 DIAGNOSIS — E119 Type 2 diabetes mellitus without complications: Secondary | ICD-10-CM

## 2012-06-16 DIAGNOSIS — Z79899 Other long term (current) drug therapy: Secondary | ICD-10-CM

## 2012-06-16 DIAGNOSIS — F3289 Other specified depressive episodes: Secondary | ICD-10-CM

## 2012-06-16 LAB — CBC
MCH: 29.2 pg (ref 26.0–34.0)
Platelets: 275 10*3/uL (ref 150–400)
RBC: 4.49 MIL/uL (ref 3.87–5.11)
WBC: 10.6 10*3/uL — ABNORMAL HIGH (ref 4.0–10.5)

## 2012-06-16 LAB — PROTIME-INR: Prothrombin Time: 13.7 seconds (ref 11.6–15.2)

## 2012-06-16 LAB — CBC WITH DIFFERENTIAL/PLATELET
Basophils Absolute: 0 10*3/uL (ref 0.0–0.1)
Basophils Relative: 0 % (ref 0–1)
Eosinophils Relative: 1 % (ref 0–5)
HCT: 42.2 % (ref 36.0–46.0)
Lymphocytes Relative: 15 % (ref 12–46)
MCHC: 33.4 g/dL (ref 30.0–36.0)
Monocytes Absolute: 1 10*3/uL (ref 0.1–1.0)
Neutro Abs: 10.1 10*3/uL — ABNORMAL HIGH (ref 1.7–7.7)
Platelets: 303 10*3/uL (ref 150–400)
RDW: 14 % (ref 11.5–15.5)
WBC: 13.3 10*3/uL — ABNORMAL HIGH (ref 4.0–10.5)

## 2012-06-16 LAB — APTT: aPTT: 30 seconds (ref 24–37)

## 2012-06-16 LAB — POCT I-STAT TROPONIN I: Troponin i, poc: 0.02 ng/mL (ref 0.00–0.08)

## 2012-06-16 LAB — COMPREHENSIVE METABOLIC PANEL
ALT: 12 U/L (ref 0–35)
Alkaline Phosphatase: 93 U/L (ref 39–117)
BUN: 12 mg/dL (ref 6–23)
BUN: 11 mg/dL (ref 6–23)
CO2: 27 mEq/L (ref 19–32)
Calcium: 9.1 mg/dL (ref 8.4–10.5)
Chloride: 99 mEq/L (ref 96–112)
Creatinine, Ser: 0.64 mg/dL (ref 0.50–1.10)
GFR calc Af Amer: >90 mL/min (ref >90)
GFR calc Af Amer: >90 mL/min (ref >90)
Glucose, Bld: 156 mg/dL — ABNORMAL HIGH (ref 70–99)
Glucose, Bld: 218 mg/dL — ABNORMAL HIGH (ref 70–99)
Potassium: 4.1 mEq/L (ref 3.5–5.1)
Sodium: 136 mEq/L (ref 135–145)
Total Bilirubin: 0.2 mg/dL — ABNORMAL LOW (ref 0.3–1.2)
Total Protein: 7 g/dL (ref 6.0–8.3)
Total Protein: 6.2 g/dL (ref 6.0–8.3)

## 2012-06-16 LAB — DIFFERENTIAL
Eosinophils Absolute: 0.2 10*3/uL (ref 0.0–0.7)
Lymphocytes Relative: 18 % (ref 12–46)
Lymphs Abs: 1.9 10*3/uL (ref 0.7–4.0)
Neutrophils Relative %: 74 % (ref 43–77)

## 2012-06-16 LAB — MAGNESIUM: Magnesium: 2 mg/dL (ref 1.5–2.5)

## 2012-06-16 LAB — PHOSPHORUS: Phosphorus: 3.3 mg/dL (ref 2.3–4.6)

## 2012-06-16 MED ORDER — ZOLPIDEM TARTRATE 5 MG PO TABS
5.0000 mg | ORAL_TABLET | Freq: Every evening | ORAL | Status: DC | PRN
Start: 2012-06-16 — End: 2012-06-19
  Administered 2012-06-16 – 2012-06-18 (×3): 5 mg via ORAL
  Filled 2012-06-16 (×3): qty 1

## 2012-06-16 MED ORDER — RAMIPRIL 10 MG PO CAPS
10.0000 mg | ORAL_CAPSULE | Freq: Every day | ORAL | Status: DC
  Administered 2012-06-16 – 2012-06-19 (×4): 10 mg via ORAL
  Filled 2012-06-16 (×4): qty 1

## 2012-06-16 MED ORDER — ONDANSETRON HCL 4 MG/2ML IJ SOLN
4.0000 mg | Freq: Four times a day (QID) | INTRAMUSCULAR | Status: DC | PRN
  Administered 2012-06-17: 4 mg via INTRAVENOUS
  Filled 2012-06-16: qty 2

## 2012-06-16 MED ORDER — CLONIDINE HCL 0.1 MG PO TABS
0.1000 mg | ORAL_TABLET | Freq: Once | ORAL | Status: AC
  Administered 2012-06-16: 0.1 mg via ORAL
  Filled 2012-06-16: qty 1

## 2012-06-16 MED ORDER — SIMVASTATIN 20 MG PO TABS
20.0000 mg | ORAL_TABLET | Freq: Every evening | ORAL | Status: DC
  Administered 2012-06-16 – 2012-06-18 (×3): 20 mg via ORAL
  Filled 2012-06-16 (×4): qty 1

## 2012-06-16 MED ORDER — ACETAMINOPHEN 325 MG PO TABS
650.0000 mg | ORAL_TABLET | Freq: Four times a day (QID) | ORAL | Status: DC | PRN
  Administered 2012-06-16 – 2012-06-17 (×2): 650 mg via ORAL
  Filled 2012-06-16 (×3): qty 2

## 2012-06-16 MED ORDER — CLONIDINE HCL 0.1 MG PO TABS
0.1000 mg | ORAL_TABLET | Freq: Three times a day (TID) | ORAL | Status: DC
  Administered 2012-06-16: 0.1 mg via ORAL
  Filled 2012-06-16 (×4): qty 1

## 2012-06-16 MED ORDER — SODIUM CHLORIDE 0.9 % IV BOLUS (SEPSIS)
250.0000 mL | Freq: Once | INTRAVENOUS | Status: AC
  Administered 2012-06-16: 250 mL via INTRAVENOUS

## 2012-06-16 MED ORDER — HYDRALAZINE HCL 20 MG/ML IJ SOLN
10.0000 mg | INTRAMUSCULAR | Status: AC
  Filled 2012-06-16: qty 1

## 2012-06-16 MED ORDER — SODIUM CHLORIDE 0.9 % IJ SOLN
3.0000 mL | Freq: Two times a day (BID) | INTRAMUSCULAR | Status: DC
  Administered 2012-06-16 – 2012-06-18 (×5): 3 mL via INTRAVENOUS

## 2012-06-16 MED ORDER — ADULT MULTIVITAMIN W/MINERALS CH
1.0000 | ORAL_TABLET | Freq: Every day | ORAL | Status: DC
  Administered 2012-06-17 – 2012-06-19 (×3): 1 via ORAL
  Filled 2012-06-16 (×3): qty 1

## 2012-06-16 MED ORDER — ONDANSETRON HCL 4 MG PO TABS
4.0000 mg | ORAL_TABLET | Freq: Four times a day (QID) | ORAL | Status: DC | PRN
  Administered 2012-06-16 – 2012-06-17 (×2): 4 mg via ORAL
  Filled 2012-06-16 (×2): qty 1

## 2012-06-16 MED ORDER — ASPIRIN 81 MG PO CHEW
81.0000 mg | CHEWABLE_TABLET | Freq: Every day | ORAL | Status: DC
  Administered 2012-06-17 – 2012-06-19 (×3): 81 mg via ORAL
  Filled 2012-06-16 (×3): qty 1

## 2012-06-16 MED ORDER — METOPROLOL TARTRATE 50 MG PO TABS
75.0000 mg | ORAL_TABLET | Freq: Two times a day (BID) | ORAL | Status: DC
  Administered 2012-06-16 – 2012-06-19 (×6): 75 mg via ORAL
  Filled 2012-06-16 (×7): qty 1

## 2012-06-16 MED ORDER — NIFEDIPINE ER OSMOTIC RELEASE 90 MG PO TB24
90.0000 mg | ORAL_TABLET | Freq: Every day | ORAL | Status: DC
  Administered 2012-06-17 – 2012-06-19 (×3): 90 mg via ORAL
  Filled 2012-06-16 (×3): qty 1

## 2012-06-16 NOTE — ED Notes (Signed)
Brought to ED by daughter with c/o generalized weakness, and high blood pressure, loose sounding cough, nonproductive. No fever

## 2012-06-16 NOTE — ED Notes (Signed)
MD at bedside. 

## 2012-06-16 NOTE — ED Notes (Signed)
Declining energy for 2 months, but worse in past three days

## 2012-06-16 NOTE — H&P (Signed)
[facility] History and Physical  [name] RUE:[id number] DOB: 1925/03/03 DOA: 06/16/2012  Referring physician: ER physician PCP: Evette Georges, MD   Chief Complaint: weakness, headaches  HPI:  78 year old female with history of HTN, CAD and history of ovarian cancer who presents with generalized weakness and headaches progressively getting worse over past few days prior to admission. Patient reports feeling dizzy but no actual loss of consciousness. There are no reports of blurry vision, no slurred speech. No chest pain, no shortness of breath, no fever or chills. No complaints of abdominal pain, no nausea or vomiting.  Assessment and Plan:  Principal Problem:  *Accelerated hypertension - BP came down from 214/85 to 175/85 with clonidine - we will continue clonidine 0.1 mg BID - may continue home medication regimen: nifedipine, metoprolol and ramipril  Active Problems:  HYPERLIPIDEMIA - continue zocor   CORONARY ARTERY DISEASE - stable, no chest pain - continue aspirin  Code Status: Full Family Communication: Pt at bedside Disposition Plan: Admit for further evaluation  Manson Passey, MD  [facility] Pager [phone]  If 7PM-7AM, please contact night-coverage [email or web address] Password [email or web address] 06/16/2012, 6:50 PM    Review of Systems:  Constitutional: Negative for fever, chills and Positive for appetite change, fatigue and unexpected weight change. HENT: Negative for hearing loss, ear pain, nosebleeds, congestion, sore throat, neck pain, tinnitus and ear discharge.   Eyes: Negative for blurred vision, double vision, photophobia, pain, discharge and redness.  Respiratory: positive for cough, hemoptysis, sputum production, shortness of breath, wheezing and stridor.   Cardiovascular: Negative for chest pain, palpitations, orthopnea, claudication and leg swelling.  Gastrointestinal: Negative for nausea, vomiting and abdominal pain. Negative for  heartburn, blood in stool and melena.  Genitourinary: Negative for dysuria, urgency, frequency, hematuria and flank pain.  Musculoskeletal: Negative for myalgias, back pain, joint pain and falls.  Skin: Negative for itching and rash.  Neurological: positive for dizziness and weakness. Negative for tingling, tremors, sensory change, speech change, focal weakness, loss of consciousness and headaches.  Endo/Heme/Allergies: Negative for environmental allergies and polydipsia. Does not bruise/bleed easily.  Psychiatric/Behavioral: Negative for suicidal ideas. The patient is not nervous/anxious.     Past Medical History  Diagnosis Date  . CAD (coronary artery disease)     prior stent to the LAD and RCA;  Last [facility] 9/05:  ostial LAD 25%, mid 25%, ostial diagonal 30%, proximal circumflex 25%, ostial OM1 25%, mid RCA 99% in-stent restenosis, then 50 and 40%.  PCI: Taxus DES to the mid RCA.  Last Myoview 5/12: EF 69%, inferior scar with minimal peri-infarct ischemia.  Last echo 2/03: EF 45-55%.  . Depression   . DM (diabetes mellitus)   . Hyperlipidemia   . Colon polyp   . Hyperplastic rectal polyp   . Ovarian cancer   . Cataract   . Bowel obstruction   . H/O: hysterectomy   . HTN (hypertension)   . Carotid stenosis     s/p right CEA 2000;  Dopplers 10/12: RICA 40-59%, right CEA okay, LICA 100%-followup one year   Past Surgical History  Procedure Date  . Angioplasty   . Abdominal hysterectomy    Social History:  reports that she has been smoking.  She does not have any smokeless tobacco history on file. She reports that she does not drink alcohol or use illicit drugs.  No Known Allergies  Family History: HTN and diabetes in parents  Prior to Admission medications   Medication Sig Start Date End  Date Taking? Authorizing Provider  aspirin 81 MG tablet Take 81 mg by mouth daily.     Yes Historical Provider, MD  Calcium Carbonate-Vitamin D (CALCIUM-VITAMIN D) 600-200 MG-UNIT CAPS Take 1  tablet by mouth daily.    Yes Historical Provider, MD  fish oil-omega-3 fatty acids 1000 MG capsule Take 1 g by mouth daily. 1 tab po qd   Yes Historical Provider, MD  metoprolol (LOPRESSOR) 50 MG tablet Take 75 mg by mouth 2 (two) times daily. 01/15/12 01/14/13 Yes [name], MD  Multiple Vitamin (MULTIVITAMIN) tablet Take 1 tablet by mouth daily.     Yes Historical Provider, MD  NIFEdipine (ADALAT CC) 90 MG 24 hr tablet Take 90 mg by mouth daily. 01/15/12  Yes [name], MD  ramipril (ALTACE) 10 MG capsule Take 10 mg by mouth daily. 01/15/12  Yes [name], MD  simvastatin (ZOCOR) 20 MG tablet Take 20 mg by mouth every evening. 01/15/12 01/14/13 Yes [name], MD  zolpidem (AMBIEN) 5 MG tablet Take 5 mg by mouth at bedtime as needed. sleep 04/17/12  Yes Roderick Pee, MD   Physical Exam: Filed Vitals:   06/16/12 1635 06/16/12 1636 06/16/12 1637 06/16/12 1816  BP: 214/85 194/105 204/76 175/86  Pulse: 69 68 75   Temp:      TempSrc:      Resp:      SpO2:        Physical Exam  Constitutional: Appears well-developed and well-nourished. No distress.  HENT: Normocephalic. External right and left ear normal. Oropharynx is clear and moist.  Eyes: Conjunctivae and EOM are normal. PERRLA, no scleral icterus.  Neck: Normal ROM. Neck supple. No JVD. No tracheal deviation. No thyromegaly.  CVS: RRR, S1/S2 +, SEM murmur appreciated radiating to carotids, no gallops, appreciated carotid bruit on the right side  Pulmonary: Effort and breath sounds normal, no stridor, rhonchi, wheezes, rales.  Abdominal: Soft. BS +,  no distension, tenderness, rebound or guarding.  Musculoskeletal: Normal range of motion. No edema and no tenderness.  Lymphadenopathy: No lymphadenopathy noted, cervical, inguinal. Neuro: Alert. Normal reflexes, muscle tone coordination. No cranial nerve deficit. Skin: Skin is warm and dry. No rash noted. Not diaphoretic. No erythema. No pallor.  Psychiatric: Normal mood  and affect. Behavior, judgment, thought content normal.   Labs on Admission:  Basic Metabolic Panel:  Lab 06/16/12 4098  NA 136  K 4.1  CL 99  CO2 27  GLUCOSE 156*  BUN 12  CREATININE 0.66  CALCIUM 9.5  MG --  PHOS --   Liver Function Tests:  Lab 06/16/12 1649  AST 18  ALT 12  ALKPHOS 93  BILITOT 0.2*  PROT 7.0  ALBUMIN 3.7   CBC:  Lab 06/16/12 1649  WBC 13.3*  NEUTROABS 10.1*  HGB 14.1  HCT 42.2  MCV 87.9  PLT 303    Radiological Exams on Admission: Dg Chest 2 View 06/16/2012  * IMPRESSION: No acute finding.    Ct Head Wo Contrast 06/16/2012  * IMPRESSION: No acute intracranial abnormality seen.     EKG: Not done on admission

## 2012-06-16 NOTE — ED Provider Notes (Signed)
History     CSN: [id number]  Arrival date & time 06/16/12  1528   First MD Initiated Contact with Patient 06/16/12 1554      Chief Complaint  Patient presents with  . Hypertension  . Weakness  . Headache    (Consider location/radiation/quality/duration/timing/severity/associated sxs/prior treatment) The history is provided by the patient.  Pt is an 78 yo female w pmh of CAD, DM, HLD, ovarian cancer, bilateral CAS presenting with 3 days complaints of "swimming head," high blood pressure, and decreased appetite.  Pt reports that she has had a sensation of "head swimming" over the past three days. She denies any visual changes or room spinning. This sensation can occur when she is sitting or standing, and does not seem to be affected by posture. She denies any current headache. She has also felt weaker than usual, but does not report any focal weakness. She was concerned today that her blood pressure might be high, so she called her daughter who took her to the ED. For the past week she has also been having some chest "congestion" with nonproductive cough.  Her daughter reports that over the past year she has had a 40+lb weight loss and increasing fatigue over the past few months.  Pt denies any visual field deficits, painful headache, focal numbness/weakness, slurred speech or asymmetry of face. She denies fever, chills, CP, SOB, vomiting, diarrhea, abdominal pain, hematochezia, melena, dysuria, hematuria.   Past Medical History  Diagnosis Date  . CAD (coronary artery disease)     prior stent to the LAD and RCA;  Last [facility] 9/05:  ostial LAD 25%, mid 25%, ostial diagonal 30%, proximal circumflex 25%, ostial OM1 25%, mid RCA 99% in-stent restenosis, then 50 and 40%.  PCI: Taxus DES to the mid RCA.  Last Myoview 5/12: EF 69%, inferior scar with minimal peri-infarct ischemia.  Last echo 2/03: EF 45-55%.  . Depression   . DM (diabetes mellitus)   . Hyperlipidemia   . Colon polyp   .  Hyperplastic rectal polyp   . Ovarian cancer   . Cataract   . Bowel obstruction   . H/O: hysterectomy   . HTN (hypertension)   . Carotid stenosis     s/p right CEA 2000;  Dopplers 10/12: RICA 40-59%, right CEA okay, LICA 100%-followup one year    Past Surgical History  Procedure Date  . Angioplasty   . Abdominal hysterectomy     Family History  Problem Relation Age of Onset  . Cancer Other     colon    History  Substance Use Topics  . Smoking status: Current Everyday Smoker -- 0.5 packs/day    Last Attempt to Quit: 01/01/2012  . Smokeless tobacco: Not on file  . Alcohol Use: No    OB History    Grav Para Term Preterm Abortions TAB SAB Ect Mult Living                  Review of Systems  Constitutional: Positive for appetite change, fatigue and unexpected weight change. Negative for fever and chills.  HENT: Negative for ear pain, sore throat, rhinorrhea, trouble swallowing, neck pain, neck stiffness, sinus pressure and ear discharge.   Eyes: Negative for photophobia, pain, discharge and visual disturbance.  Respiratory: Positive for cough. Negative for chest tightness and shortness of breath.   Cardiovascular: Negative for chest pain, palpitations and leg swelling.  Gastrointestinal: Positive for constipation. Negative for nausea, vomiting, abdominal pain, diarrhea and blood in stool.  Genitourinary: Negative for dysuria, frequency and hematuria.  Neurological: Positive for dizziness and weakness. Negative for facial asymmetry, speech difficulty and numbness.    Allergies  Review of patient's allergies indicates no known allergies.  Home Medications   Current Outpatient Rx  Name Route Sig Dispense Refill  . ASPIRIN 81 MG PO TABS Oral Take 81 mg by mouth daily.      Marland Kitchen CALCIUM-VITAMIN D 600-200 MG-UNIT PO CAPS Oral Take 1 tablet by mouth daily.     . OMEGA-3 FATTY ACIDS 1000 MG PO CAPS Oral Take 1 g by mouth daily. 1 tab po qd    . METOPROLOL TARTRATE 50 MG PO  TABS Oral Take 75 mg by mouth 2 (two) times daily.    Marland Kitchen ONE-DAILY MULTI VITAMINS PO TABS Oral Take 1 tablet by mouth daily.      Marland Kitchen NIFEDIPINE ER 90 MG PO TB24 Oral Take 90 mg by mouth daily.    Marland Kitchen RAMIPRIL 10 MG PO CAPS Oral Take 10 mg by mouth daily.    Marland Kitchen SIMVASTATIN 20 MG PO TABS Oral Take 20 mg by mouth every evening.    Marland Kitchen ZOLPIDEM TARTRATE 5 MG PO TABS Oral Take 5 mg by mouth at bedtime as needed. sleep      BP 204/76  Pulse 75  Temp 97.8 F (36.6 C) (Oral)  Resp 20  SpO2 99%  Physical Exam  Constitutional: She is oriented to person, place, and time.       Cachetic elderly female lying in bed, NAD  HENT:  Head: Normocephalic and atraumatic.  Mouth/Throat: Oropharynx is clear and moist.  Eyes: Conjunctivae are normal. Pupils are equal, round, and reactive to light.  Neck: Normal range of motion. Neck supple. No JVD present. No tracheal deviation present.       R sided carotid bruit  Cardiovascular: Normal rate, regular rhythm, normal heart sounds and intact distal pulses.  Exam reveals no gallop and no friction rub.   No murmur heard. Pulmonary/Chest: Effort normal and breath sounds normal. No respiratory distress.  Abdominal: Soft. Bowel sounds are normal. She exhibits no distension. There is no tenderness.  Lymphadenopathy:    She has no cervical adenopathy.  Neurological: She is alert and oriented to person, place, and time. She has normal strength and normal reflexes. No cranial nerve deficit or sensory deficit. Coordination normal. GCS eye subscore is 4. GCS verbal subscore is 5. GCS motor subscore is 6.    ED Course  Procedures (including critical care time)   Labs Reviewed  COMPREHENSIVE METABOLIC PANEL  CBC WITH DIFFERENTIAL   No results found.   No diagnosis found.   Date: 06/16/2012  Rate: 68   Rhythm: normal sinus rhythm  QRS Axis: normal  Intervals: normal  ST/T Wave abnormalities: nonspecific ST changes  Conduction Disutrbances: Possible mild RBBB   Narrative Interpretation: Normal sinus rhythm, no ischemic changes, no significant changes from previous tracing 11/14/10  Old EKG Reviewed: unchanged    MDM  1. Hypertensive Urgency Pt w progressive weight loss over the past year, 3 days of decreased appetite and complaints of dizziness. Lives alone, denies falls. On presentation to ED, BP was 200/82, decreased to 149/79 and 155/83 on my repeat examinations. No focal neurological deficits. Exam nonfocal.  - Will check EKG, troponin, CMet, CBC, CT head, CXR - Will give 250cc NS bolus.  Bronson Curb 06/16/2012 4:46 PM   Pt continued to remain hypertensive w systolic pressures 200-220 after po catapres. Continues to  complain of "swimming head." CT head negative, CXR no abnormalities, blood work unremarkable. Spoke w Dr. Elisabeth Pigeon [facility], they will admit pt for evaluation of hypertensive urgency.  Bronson Curb 06/16/2012 10:30 PM          Bronson Curb, MD 06/16/12 2230  Bronson Curb, MD 06/16/12 [id number]

## 2012-06-17 DIAGNOSIS — I2510 Atherosclerotic heart disease of native coronary artery without angina pectoris: Secondary | ICD-10-CM

## 2012-06-17 LAB — CBC
MCH: 29.1 pg (ref 26.0–34.0)
MCHC: 32.6 g/dL (ref 30.0–36.0)
Platelets: 260 10*3/uL (ref 150–400)
RBC: 4.09 MIL/uL (ref 3.87–5.11)
RDW: 14.3 % (ref 11.5–15.5)

## 2012-06-17 LAB — COMPREHENSIVE METABOLIC PANEL
AST: 17 U/L (ref 0–37)
Alkaline Phosphatase: 72 U/L (ref 39–117)
BUN: 14 mg/dL (ref 6–23)
CO2: 26 mEq/L (ref 19–32)
Chloride: 104 mEq/L (ref 96–112)
Creatinine, Ser: 0.95 mg/dL (ref 0.50–1.10)
GFR calc non Af Amer: 53 mL/min — ABNORMAL LOW (ref >90)
Potassium: 5 mEq/L (ref 3.5–5.1)
Total Bilirubin: 0.2 mg/dL — ABNORMAL LOW (ref 0.3–1.2)

## 2012-06-17 LAB — TSH: TSH: 1.349 u[IU]/mL (ref 0.350–4.500)

## 2012-06-17 MED ORDER — CLONIDINE HCL 0.1 MG PO TABS
0.1000 mg | ORAL_TABLET | Freq: Two times a day (BID) | ORAL | Status: DC
  Administered 2012-06-17 – 2012-06-18 (×3): 0.1 mg via ORAL
  Filled 2012-06-17 (×4): qty 1

## 2012-06-17 MED ORDER — HYDRALAZINE HCL 25 MG PO TABS
25.0000 mg | ORAL_TABLET | Freq: Three times a day (TID) | ORAL | Status: DC
  Filled 2012-06-17 (×4): qty 1

## 2012-06-17 MED ORDER — ENSURE COMPLETE PO LIQD: 237.0000 mL | Freq: Every day | ORAL | Status: DC

## 2012-06-17 MED ORDER — HYDRALAZINE HCL 20 MG/ML IJ SOLN
10.0000 mg | Freq: Four times a day (QID) | INTRAMUSCULAR | Status: DC
  Administered 2012-06-17 – 2012-06-18 (×5): 10 mg via INTRAVENOUS
  Filled 2012-06-17: qty 1
  Filled 2012-06-17: qty 0.5
  Filled 2012-06-17: qty 1
  Filled 2012-06-17 (×2): qty 0.5
  Filled 2012-06-17: qty 1
  Filled 2012-06-17: qty 0.5
  Filled 2012-06-17: qty 1

## 2012-06-17 MED ORDER — METOCLOPRAMIDE HCL 5 MG/ML IJ SOLN
5.0000 mg | Freq: Once | INTRAMUSCULAR | Status: AC
  Administered 2012-06-17: 5 mg via INTRAVENOUS
  Filled 2012-06-17: qty 2

## 2012-06-17 NOTE — ED Provider Notes (Signed)
I saw and evaluated the patient, reviewed the resident's note and I agree with the findings and plan.  The patient presents to the ER for evaluation of dizziness, elevated bp.  This has been occurring for the past several days.  She has a history of htn but has never felt like this before.  On exam, she is afebrile and vitals are stable.  The bp is markedly elevated with a systolic pressure well over 200.  The heart is rrr and the lungs have slight rhonchi.  The abdomen is benign.  The neurological exam is non-focal.  She was given clonidine, however she is maintaining a blood pressure of over 200.  Medicine has been consulted and she will be admitted to their service for treatment an further workup.    Geoffery Lyons, MD 06/17/12 [phone]

## 2012-06-17 NOTE — Progress Notes (Signed)
Pt with BP 124/68 before administering apressoline. Apressoline held/parameters not met.

## 2012-06-17 NOTE — Progress Notes (Signed)
Pt with BP 215/69, HR 60's/NSR. Pt also complaining of a mild headache. No further symptoms. Lance Coon, NP made aware, new orders received

## 2012-06-17 NOTE — Clinical Documentation Improvement (Signed)
BMI DOCUMENTATION CLARIFICATION QUERY  THIS DOCUMENT IS NOT A PERMANENT PART OF THE MEDICAL RECORD  TO RESPOND TO THE THIS QUERY, FOLLOW THE INSTRUCTIONS BELOW:  1. If needed, update documentation for the patient's encounter via the notes activity.  2. Access this query again and click edit on the In [employer].  3. After updating, or not, click F2 to complete all highlighted (required) fields concerning your review. Select "additional documentation in the medical record" OR "no additional documentation provided".  4. Click Sign note button.  5. The deficiency will fall out of your In Basket *Please let us know if you are not able to complete this workflow by phone or e-mail (listed below).         06/17/12  Dear Dr. Theda [facility]  In an effort to better capture your patient's severity of illness, reflect appropriate length of stay and utilization of resources, a review of the patient medical record has revealed the following indicators.    Based on your clinical judgment, please clarify and document in a progress note and/or discharge summary the clinical condition associated with the following supporting information:  In responding to this query please exercise your independent judgment.  The fact that a query is asked, does not imply that any particular answer is desired or expected.  Possible Clinical conditions  Underweight w/BMI=14.4kg(mm)  Other condition  Cannot Clinically determine   Risk Factors: Pt's Weight & Body Mass Index  Sign & Symptoms: "Cachetic elderly female lying in bed' per notes "Unexpected weight change" per EDP notes "Over the past year she has had a 40+lb weight loss and increasing fatigue over the past few months" per EDP notes   "Positive for appetite change" per notes  Biometrics: Weight: 72lb (32.88kg) Height:  23ft 11.5in BMI=     14.4                   BMI  Table  Women Men  Underweight < 19    <20     Acceptable 19  - 25    20 - 25   Overweight 25 - 30    25 - 30  Obese 30 - 40    30 - 40   Morbidly Obese > 40    > 40    [employer]    Treatment Daily weights New order to consult Dietitian Multivitamin w/ minerals 1 pill po daily Monitoring I&O and labs    Reviewed: Query not addressed  Thank You,    Rossie Muskrat RN, BSN Clinical Documentation Specialist Pager:  [phone]  Health Information Management [place]

## 2012-06-17 NOTE — Progress Notes (Signed)
[facility] PROGRESS NOTE  [name] RUE:[id number] DOB: 30-Sep-1925 DOA: 06/16/2012 PCP: Evette Georges, MD  Assessment/Plan:   *Accelerated hypertension She informs being compliant to her medications at home. Head  CT done on admission unremarkable. Resumed her home meds including metoprolol nifedipine and ramipril. On admission was also  on clonidine with better control for blood pressure. i will switch to hydralazine slowly Will get PT and nutrition eval - telemetry monitoring -Symptoms have been ongoing for past 6-8 weeks now and worsened over the last 1 week. If symptoms don't improve with good control of blood pressure will think of getting further brain imaging including MRI of the brain.   CAD Continue simvastatin and aspirin   Code Status: full Family Communication: daughter at bedside Disposition Plan: pending PT eval   Brief narrative: 78 year old female with history of HTN, CAD and history of ovarian cancer who presents with generalized weakness and headaches progressively getting worse over past few days prior to admission   Consultants:  none     HPI/Subjective: Still has headache and dizziness. Feels nauseous  Objective: Filed Vitals:   06/16/12 2235 06/17/12 0716 06/17/12 0917 06/17/12 1014  BP: 124/68 160/75 205/74 131/58  Pulse: 68 48 55 61  Temp:  97.5 F (36.4 C)    TempSrc:  Axillary    Resp: 20 20 18 16   Height:      Weight:      SpO2: 97% 96%      Intake/Output Summary (Last 24 hours) at 06/17/12 1323 Last data filed at 06/17/12 0913  Gross per 24 hour  Intake    120 ml  Output    200 ml  Net    -80 ml   Filed Weights   06/16/12 2031  Weight: 32.886 kg (72 lb 8 oz)    Exam:   General: Elderly thin built female lying in bed in no acute distress  HE ENT: No pallor moist oral mucosa pupils , reactive bilaterally  Cardiovascular: Normal S1 and S2 no murmurs rub or gallop  Respiratory: Clear bilaterally no added  sounds  Abdomen: Soft nontender bowel sounds present  Extremities: Warm no edema  CNS: AAO x3 nonfocal  Data Reviewed: Basic Metabolic Panel:  Lab 06/17/12 1478 06/16/12 2125 06/16/12 1649  NA 138 138 136  K 5.0 3.7 4.1  CL 104 102 99  CO2 26 29 27   GLUCOSE 117* 218* 156*  BUN 14 11 12   CREATININE 0.95 0.64 0.66  CALCIUM 9.0 9.1 9.5  MG -- 2.0 --  PHOS -- 3.3 --   Liver Function Tests:  Lab 06/17/12 0424 06/16/12 2125 06/16/12 1649  AST 17 16 18   ALT 11 11 12   ALKPHOS 72 82 93  BILITOT 0.2* 0.2* 0.2*  PROT 5.6* 6.2 7.0  ALBUMIN 2.9* 3.2* 3.7   No results found for this basename: LIPASE:5,AMYLASE:5 in the last 168 hours No results found for this basename: AMMONIA:5 in the last 168 hours CBC:  Lab 06/17/12 0424 06/16/12 2125 06/16/12 1649  WBC 8.5 10.6* 13.3*  NEUTROABS -- 7.8* 10.1*  HGB 11.9* 13.1 14.1  HCT 36.5 39.8 42.2  MCV 89.2 88.6 87.9  PLT 260 275 303   Cardiac Enzymes: No results found for this basename: CKTOTAL:5,CKMB:5,CKMBINDEX:5,TROPONINI:5 in the last 168 hours BNP (last 3 results) No results found for this basename: PROBNP:3 in the last 8760 hours CBG: No results found for this basename: GLUCAP:5 in the last 168 hours  No results found for  this or any previous visit (from the past 240 hour(s)).   Studies: Dg Chest 2 View  06/16/2012  *RADIOLOGY REPORT*  Clinical Data: Cough for 1 week.  CHEST - 2 VIEW  Comparison: PA and lateral chest 05/01/2012.  Findings: The chest is hyperexpanded but the lungs are clear. No pneumothorax of pleural fluid. Heart size normal.  IMPRESSION: No acute finding.  Original Report Authenticated By: Bernadene Bell. Maricela Curet, M.D.   Ct Head Wo Contrast  06/16/2012  *RADIOLOGY REPORT*  Clinical Data: Headache, hypertension.  CT HEAD WITHOUT CONTRAST  Technique:  Contiguous axial images were obtained from the base of the skull through the vertex without contrast.  Comparison: November 14, 2010.  Findings: Bony calvarium is  intact except for prominent venous lakes seen posteriorly which are unchanged compared to prior exam. Mild diffuse cortical atrophy is noted.  No mass effect or midline shift is noted.  Ventricular size is within normal limits.  There is no evidence of mass, hemorrhage or acute infarction.  IMPRESSION: No acute intracranial abnormality seen.  Original Report Authenticated By: Venita Sheffield., M.D.    Scheduled Meds:   . aspirin  81 mg Oral Daily  . cloNIDine  0.1 mg Oral Once  . cloNIDine  0.1 mg Oral BID  . hydrALAZINE  10 mg Intravenous NOW  . hydrALAZINE  10 mg Intravenous Q6H  . metoCLOPramide (REGLAN) injection  5 mg Intravenous Once  . metoprolol  75 mg Oral BID  . multivitamin with minerals  1 tablet Oral Daily  . NIFEdipine  90 mg Oral Daily  . ramipril  10 mg Oral Daily  . simvastatin  20 mg Oral QPM  . sodium chloride  250 mL Intravenous Once  . sodium chloride  3 mL Intravenous Q12H  . DISCONTD: cloNIDine  0.1 mg Oral TID  . DISCONTD: hydrALAZINE  25 mg Oral Q8H   Continuous Infusions:     Time spent: 25 minutes    Anahita Cua  [facility] Pager [phone]. If 8PM-8AM, please contact night-coverage at [email or web address], password [facility] 06/17/2012, 1:23 PM  LOS: 1 day

## 2012-06-17 NOTE — Progress Notes (Addendum)
INITIAL ADULT NUTRITION ASSESSMENT Date: 06/17/2012   Time: 5:31 PM Reason for Assessment: Consult  INTERVENTION: Ensure Complete daily. Ordered snacks per pt preference. Will monitor.   Pt meets criteria for severe PCM of chronic illness AEB 35.7% weight loss in the past 1-2 years per pt report and pt with severe muscle wasting in clavicles, shoulders, and upper extremities and subcutaneous fat loss in fat overlying the ribs with BMI of 14.4   ASSESSMENT: Female 78 y.o.  Dx: Accelerated hypertension  Food/Nutrition Related Hx: Met with pt who reports eating well PTA, 3 meals/day with most of her meals coming from eating out a lot. Pt with dentures but denies any problems chewing/swallowing. Pt states she has lost 40 pounds unintentionally in the past 1-2 years and states that she has no idea how it happened and that she has been to several doctors and they could not explain the weight loss. Pt reports when she got married she weighed 90 pounds and after her daughter was born she weighed 90 pounds. Pt states before 2 years ago her weight was going up but recently it went back down. Pt states in the past few days her appetite has declined and that in the past 2 weeks she bought Ensure and was drinking 1 can/day. Pt c/o nausea, was snacking on crackers and ginger ale during visit. Noted pt's phosphorus and magnesium WNL.   Hx:  Past Medical History  Diagnosis Date  . CAD (coronary artery disease)     prior stent to the LAD and RCA;  Last [facility] 9/05:  ostial LAD 25%, mid 25%, ostial diagonal 30%, proximal circumflex 25%, ostial OM1 25%, mid RCA 99% in-stent restenosis, then 50 and 40%.  PCI: Taxus DES to the mid RCA.  Last Myoview 5/12: EF 69%, inferior scar with minimal peri-infarct ischemia.  Last echo 2/03: EF 45-55%.  . Depression   . DM (diabetes mellitus)   . Hyperlipidemia   . Colon polyp   . Hyperplastic rectal polyp   . Ovarian cancer   . Cataract   . Bowel obstruction   . H/O:  hysterectomy   . HTN (hypertension)   . Carotid stenosis     s/p right CEA 2000;  Dopplers 10/12: RICA 40-59%, right CEA okay, LICA 100%-followup one year   Related Meds:  Scheduled Meds:   . aspirin  81 mg Oral Daily  . cloNIDine  0.1 mg Oral Once  . cloNIDine  0.1 mg Oral BID  . hydrALAZINE  10 mg Intravenous NOW  . hydrALAZINE  10 mg Intravenous Q6H  . metoCLOPramide (REGLAN) injection  5 mg Intravenous Once  . metoprolol  75 mg Oral BID  . multivitamin with minerals  1 tablet Oral Daily  . NIFEdipine  90 mg Oral Daily  . ramipril  10 mg Oral Daily  . simvastatin  20 mg Oral QPM  . sodium chloride  3 mL Intravenous Q12H  . DISCONTD: cloNIDine  0.1 mg Oral TID  . DISCONTD: hydrALAZINE  25 mg Oral Q8H   Continuous Infusions:  PRN Meds:.acetaminophen, ondansetron (ZOFRAN) IV, ondansetron, zolpidem  Ht: 4' 11.5" (151.1 cm)  Wt: 72 lb 8 oz (32.886 kg)  Ideal Wt: 98 lb % Ideal Wt: 73  Usual Wt: 112 lb per pt report % Usual Wt: 64  Body mass index is 14.40 kg/(m^2).  Labs:  CMP     Component Value Date/Time   NA 138 06/17/2012 0424   K 5.0 06/17/2012 0424   CL  104 06/17/2012 0424   CO2 26 06/17/2012 0424   GLUCOSE 117* 06/17/2012 0424   BUN 14 06/17/2012 0424   CREATININE 0.95 06/17/2012 0424   CALCIUM 9.0 06/17/2012 0424   PROT 5.6* 06/17/2012 0424   ALBUMIN 2.9* 06/17/2012 0424   AST 17 06/17/2012 0424   ALT 11 06/17/2012 0424   ALKPHOS 72 06/17/2012 0424   BILITOT 0.2* 06/17/2012 0424   GFRNONAA 53* 06/17/2012 0424   GFRAA 62* 06/17/2012 0424   Lab Results  Component Value Date   HGBA1C 6.7* 12/04/2011   CBG (last 3)  No results found for this basename: GLUCAP:3 in the last 72 hours   Intake/Output Summary (Last 24 hours) at 06/17/12 1738 Last data filed at 06/17/12 1727  Gross per 24 hour  Intake    360 ml  Output    325 ml  Net     35 ml   Last BM - 06/15/12  Diet Order: Sodium Restricted   IVF:    Estimated Nutritional  Needs:   Kcal:1400-1550 Protein:65-90g Fluid:1.4-1.5L  NUTRITION DIAGNOSIS: -Inadequate oral intake (NI-2.1).  Status: Ongoing  RELATED TO: decreased appetite with nausea  AS EVIDENCE BY: pt statement  MONITORING/EVALUATION(Goals): Pt to consume >90% of meals/supplements.   EDUCATION NEEDS: -No education needs identified at this time  Dietitian #: [phone]  DOCUMENTATION CODES Per approved criteria  -Severe malnutrition in the context of chronic illness -Underweight    Marshall Cork 06/17/2012, 5:31 PM

## 2012-06-17 NOTE — Progress Notes (Signed)
   CARE MANAGEMENT NOTE 06/17/2012  Patient:  [name], [name]   Account Number:  [id number]  Date Initiated:  06/17/2012  Documentation initiated by:  Jiles Crocker  Subjective/Objective Assessment:   ADMITTED WITH Accelerated hypertension     Action/Plan:   PCP: Evette Georges, MD  LIVES ALONE PRIOR TO ADMISSION   Anticipated DC Date:  06/20/2012   Anticipated DC Plan:  HOME/SELF CARE         Choice offered to / List presented to:             Status of service:  In process, will continue to follow Medicare Important Message given?  NA - LOS <3 / Initial given by admissions (If response is "NO", the following Medicare IM given date fields will be blank)  Per UR Regulation:  Reviewed for med. necessity/level of care/duration of stay   Comments:  06/17/2012- B Cyera Balboni RN, BSN, MHA

## 2012-06-18 DIAGNOSIS — E785 Hyperlipidemia, unspecified: Secondary | ICD-10-CM

## 2012-06-18 MED ORDER — HYDRALAZINE HCL 10 MG PO TABS
10.0000 mg | ORAL_TABLET | Freq: Four times a day (QID) | ORAL | Status: DC
  Administered 2012-06-19 (×3): 10 mg via ORAL
  Filled 2012-06-18 (×6): qty 1

## 2012-06-18 MED ORDER — LORAZEPAM 0.5 MG PO TABS
0.5000 mg | ORAL_TABLET | Freq: Once | ORAL | Status: AC
  Administered 2012-06-18: 0.5 mg via ORAL
  Filled 2012-06-18: qty 1

## 2012-06-18 MED ORDER — MECLIZINE HCL 12.5 MG PO TABS
12.5000 mg | ORAL_TABLET | Freq: Three times a day (TID) | ORAL | Status: DC | PRN
  Administered 2012-06-18 – 2012-06-19 (×2): 12.5 mg via ORAL
  Filled 2012-06-18 (×2): qty 1

## 2012-06-18 NOTE — Evaluation (Signed)
Physical Therapy Evaluation Patient Details Name: [name] MRN: [id number] DOB: 31-May-1925 Today's Date: 06/18/2012 Time: 4098-1191 PT Time Calculation (min): 19 min  PT Assessment / Plan / Recommendation Clinical Impression  78 yo female admitted with accelerated hypertension. Mobilizing fairly well. BP duirng session: supine-109/48, sitting-119/57, standing-119/54. C/o some lightheadedness with activity. Pt states her daughter and neighbor can assist if needed. Recommend home safety evaluation.     PT Assessment  Patient needs continued PT services    Follow Up Recommendations  No PT follow up    Barriers to Discharge        Equipment Recommendations  None recommended by PT    Recommendations for Other Services     Frequency Min 3X/week    Precautions / Restrictions Precautions Precautions: Fall Restrictions Weight Bearing Restrictions: No   Pertinent Vitals/Pain       Mobility  Bed Mobility Bed Mobility: Supine to Sit Supine to Sit: 7: Independent Transfers Transfers: Sit to Stand;Stand to Sit Sit to Stand: 5: Supervision Stand to Sit: 5: Supervision Ambulation/Gait Ambulation/Gait Assistance: 4: Min assist Ambulation Distance (Feet): 150 Feet Assistive device: None Ambulation/Gait Assistance Details: Pt = 90%. Unsteady at times requiring intermittent external assist for increased sway, wavering.  Gait Pattern: Step-through pattern;Decreased stride length;Decreased step length - right;Decreased step length - left;Narrow base of support    Exercises     PT Diagnosis: Difficulty walking  PT Problem List: Decreased mobility;Decreased activity tolerance PT Treatment Interventions: Gait training;Stair training;Functional mobility training;Therapeutic exercise;Patient/family education   PT Goals Acute Rehab PT Goals PT Goal Formulation: With patient Time For Goal Achievement: 06/25/12 Potential to Achieve Goals: Good Pt will go Sit to Stand: with  modified independence PT Goal: Sit to Stand - Progress: Goal set today Pt will Ambulate: 51 - 150 feet;with modified independence PT Goal: Ambulate - Progress: Goal set today Pt will Go Up / Down Stairs: 6-9 stairs;with rail(s);with supervision (7 steps) PT Goal: Up/Down Stairs - Progress: Goal set today  Visit Information  Last PT Received On: 06/18/12 Assistance Needed: +1    Subjective Data  Subjective: "I wouldn't run across the lawn right now" Patient Stated Goal: Home   Prior Functioning  Home Living Lives With: Alone Available Help at Discharge: Family;Neighbor Type of Home: House Home Access: Stairs to enter [employer] of Steps: 7 Entrance Stairs-Rails: Right Home Layout: One level Home Adaptive Equipment: None Prior Function Level of Independence: Independent;Needs assistance Needs Assistance: Light Housekeeping Comments: Daughter perfoms housekeeping and occasionally grocery shopping Communication Communication: No difficulties    Cognition  Overall Cognitive Status: Appears within functional limits for tasks assessed/performed Arousal/Alertness: Awake/Beard Orientation Level: Appears intact for tasks assessed Behavior During Session: [name] for tasks performed    Extremity/Trunk Assessment Right Lower Extremity Assessment RLE ROM/Strength/Tone: [name] for tasks assessed Left Lower Extremity Assessment LLE ROM/Strength/Tone: [name] for tasks assessed   Balance    End of Session PT - End of Session Equipment Utilized During Treatment: Gait belt Activity Tolerance: Patient tolerated treatment well Patient left: in chair;with call bell/phone within reach  GP     [name] [name] 06/18/2012, 12:04 PM [phone]

## 2012-06-18 NOTE — Progress Notes (Signed)
[facility] PROGRESS NOTE  ALITHEA LAPAGE ZOX:[id number] DOB: 1925/06/20 DOA: 06/16/2012 PCP: Evette Georges, MD  Brief narrative: 78 year old female with history of HTN, CAD and history of ovarian cancer who presents with generalized weakness and headaches progressively getting worse over past few days prior to admission.  Assessment/Plan: Dizziness with Accelerated hypertension  She informs being compliant to her medications at home. Head CT done on admission unremarkable.  Resumed her home meds including metoprolol nifedipine and ramipril. On admission was also on clonidine with better control for blood pressure.  switch to hydralazine slowly and discontinued her clonidine today -Appreciate PT and nutrition eval  - Stable on telemetry monitoring  -Symptoms have been ongoing for past 6-8 weeks now and worsened over the last 1 week. -Symptoms are improved now with good control of blood pressure however she does get little dizzy with ambulation and likely has some component of benign positional vertigo. I will add a low-dose meclizine when necessary and monitor for improvement  CAD  Continue simvastatin and aspirin   Code Status: full  Family Communication: daughter updated Disposition Plan: Can be discharged home dizziness continues to improve and blood pressure in stable overnight       Consultants:  none  Procedures:  None  Antibiotics:  None  HPI/Subjective: The dizziness is improved but  still has some symptoms while trying to ambulate  Objective: Filed Vitals:   06/17/12 2147 06/18/12 0601 06/18/12 0604 06/18/12 1349  BP: 124/58 167/65 167/65 112/51  Pulse: 57  61 62  Temp: 98.2 F (36.8 C)  98 F (36.7 C) 97.9 F (36.6 C)  TempSrc: Oral  Oral Oral  Resp: 16  18 16   Height:      Weight:   33.022 kg (72 lb 12.8 oz)   SpO2: 94%  100% 93%    Intake/Output Summary (Last 24 hours) at 06/18/12 1652 Last data filed at 06/18/12 1300  Gross per 24  hour  Intake    603 ml  Output    375 ml  Net    228 ml   Filed Weights   06/16/12 2031 06/18/12 0604  Weight: 32.886 kg (72 lb 8 oz) 33.022 kg (72 lb 12.8 oz)    Exam:  General: Elderly thin built female lying in bed in no acute distress  HE ENT: No pallor moist oral mucosa pupils , reactive bilaterally , mild b/l horizontal nystagmus Cardiovascular: Normal S1 and S2 no murmurs rub or gallop  Respiratory: Clear bilaterally no added sounds  Abdomen: Soft nontender bowel sounds present  Extremities: Warm no edema  CNS: AAO x3 nonfocal   Data Reviewed: Basic Metabolic Panel:  Lab 06/17/12 8119 06/16/12 2125 06/16/12 1649  NA 138 138 136  K 5.0 3.7 4.1  CL 104 102 99  CO2 26 29 27   GLUCOSE 117* 218* 156*  BUN 14 11 12   CREATININE 0.95 0.64 0.66  CALCIUM 9.0 9.1 9.5  MG -- 2.0 --  PHOS -- 3.3 --   Liver Function Tests:  Lab 06/17/12 0424 06/16/12 2125 06/16/12 1649  AST 17 16 18   ALT 11 11 12   ALKPHOS 72 82 93  BILITOT 0.2* 0.2* 0.2*  PROT 5.6* 6.2 7.0  ALBUMIN 2.9* 3.2* 3.7   No results found for this basename: LIPASE:5,AMYLASE:5 in the last 168 hours No results found for this basename: AMMONIA:5 in the last 168 hours CBC:  Lab 06/17/12 0424 06/16/12 2125 06/16/12 1649  WBC 8.5 10.6* 13.3*  NEUTROABS --  7.8* 10.1*  HGB 11.9* 13.1 14.1  HCT 36.5 39.8 42.2  MCV 89.2 88.6 87.9  PLT 260 275 303   Cardiac Enzymes: No results found for this basename: CKTOTAL:5,CKMB:5,CKMBINDEX:5,TROPONINI:5 in the last 168 hours BNP (last 3 results) No results found for this basename: PROBNP:3 in the last 8760 hours CBG: No results found for this basename: GLUCAP:5 in the last 168 hours  No results found for this or any previous visit (from the past 240 hour(s)).   Studies: Dg Chest 2 View  06/16/2012  *RADIOLOGY REPORT*  Clinical Data: Cough for 1 week.  CHEST - 2 VIEW  Comparison: PA and lateral chest 05/01/2012.  Findings: The chest is hyperexpanded but the lungs are  clear. No pneumothorax of pleural fluid. Heart size normal.  IMPRESSION: No acute finding.  Original Report Authenticated By: Bernadene Bell. Maricela Curet, M.D.   Ct Head Wo Contrast  06/16/2012  *RADIOLOGY REPORT*  Clinical Data: Headache, hypertension.  CT HEAD WITHOUT CONTRAST  Technique:  Contiguous axial images were obtained from the base of the skull through the vertex without contrast.  Comparison: November 14, 2010.  Findings: Bony calvarium is intact except for prominent venous lakes seen posteriorly which are unchanged compared to prior exam. Mild diffuse cortical atrophy is noted.  No mass effect or midline shift is noted.  Ventricular size is within normal limits.  There is no evidence of mass, hemorrhage or acute infarction.  IMPRESSION: No acute intracranial abnormality seen.  Original Report Authenticated By: Venita Sheffield., M.D.    Scheduled Meds:   . aspirin  81 mg Oral Daily  . cloNIDine  0.1 mg Oral BID  . feeding supplement  237 mL Oral Q1400  . hydrALAZINE  10 mg Intravenous NOW  . hydrALAZINE  10 mg Intravenous Q6H  . LORazepam  0.5 mg Oral Once  . metoprolol  75 mg Oral BID  . multivitamin with minerals  1 tablet Oral Daily  . NIFEdipine  90 mg Oral Daily  . ramipril  10 mg Oral Daily  . simvastatin  20 mg Oral QPM  . sodium chloride  3 mL Intravenous Q12H   Continuous Infusions:      Time spent: 30 minutes    Amirr Achord  [facility] Pager [phone] If 8PM-8AM, please contact night-coverage at [email or web address], password [facility] 06/18/2012, 4:52 PM  LOS: 2 days

## 2012-06-19 DIAGNOSIS — I10 Essential (primary) hypertension: Secondary | ICD-10-CM

## 2012-06-19 DIAGNOSIS — H8110 Benign paroxysmal vertigo, unspecified ear: Secondary | ICD-10-CM | POA: Diagnosis present

## 2012-06-19 MED ORDER — HYDRALAZINE HCL 10 MG PO TABS: 10.0000 mg | ORAL_TABLET | Freq: Three times a day (TID) | ORAL | Status: DC

## 2012-06-19 MED ORDER — MECLIZINE HCL 12.5 MG PO TABS: 12.5000 mg | ORAL_TABLET | Freq: Three times a day (TID) | ORAL | Status: DC | PRN

## 2012-06-19 MED ORDER — ENSURE COMPLETE PO LIQD: 237.0000 mL | Freq: Every day | ORAL | Status: DC

## 2012-06-19 NOTE — Progress Notes (Addendum)
Physical Therapy Treatment Patient Details Name: [name] MRN: [id number] DOB: [birth year] Today's Date: 06/19/2012 Time: 1040-1105 PT Time Calculation (min): 25 min  PT Assessment / Plan / Recommendation Comments on Treatment Session  Entered room with pt leaning on bathroom sink-stated she was dizzy. Pulled BSC out for pt to sit and rest. Assisted pt from bathroom to recliner. BP:162/56. Pt rested in chair for 10-15 minutes before attempting ambulation with PT in hallway. Dizziness did not resolve throughout session. Pt stated her daughter was not feeling well today (daughter is person who is supposed to provide 24 hour care). Do not feel pt is safe to d/c home today with her complaints and with her daughter feeling ill (if daughter is not able to provide care). Also consulted with another PT about possible vestibular screen to rule out possibilty of BPPV. Notified RN    Follow Up Recommendations  Home health PT;Supervision/Assistance - 24 hour;Supervision for mobility/OOB    Barriers to Discharge        Equipment Recommendations  None recommended by PT    Recommendations for Other Services    Frequency Min 3X/week   Plan Discharge plan needs to be updated    Precautions / Restrictions Precautions Precautions: Fall Restrictions Weight Bearing Restrictions: No   Pertinent Vitals/Pain     Mobility  Bed Mobility Bed Mobility: Sit to Supine Sit to Supine: 7: Independent Transfers Transfers: Sit to Stand;Stand to Sit Sit to Stand: 5: Supervision Stand to Sit: 5: Supervision Details for Transfer Assistance: Supervisio due to dizziness Ambulation/Gait Ambulation/Gait Assistance: 4: Min Environmental consultant (Feet): 100 Feet Assistive device: None Ambulation/Gait Assistance Details: Limited distance this session due to pt c/o lilghtheadedness "head not feeling right". 1st walk was in room from bathroom to chair. Pt took long rest break ~10-15 minutes. Lightheadedness  did not resolve. Pt agreed to atttempt ambulation in hallway. Continued to c/o lightheadedness. Pt unsteady throughout ambulation.  Gait Pattern: Step-to pattern;Decreased stride length;Decreased step length - right;Decreased step length - left Stairs: No (unable to attempt stairs due to pt c/o dizziness that does not resolve)    Exercises     PT Diagnosis:    PT Problem List:   PT Treatment Interventions:     PT Goals Acute Rehab PT Goals Pt will go Sit to Stand: with modified independence PT Goal: Sit to Stand - Progress: Progressing toward goal Pt will Ambulate: 51 - 150 feet PT Goal: Ambulate - Progress: Progressing toward goal  Visit Information  Last PT Received On: 06/19/12 Assistance Needed: +1    Subjective Data  Subjective: "My head just doesn't feel right. It's not getting any better" Patient Stated Goal: Home   Cognition  Overall Cognitive Status: Appears within functional limits for tasks assessed/performed Arousal/Alertness: Awake/alert Orientation Level: Appears intact for tasks assessed Behavior During Session: [facility] for tasks performed    Balance  Balance Balance Assessed: Yes Dynamic Standing Balance Dynamic Standing - Balance Support: No upper extremity supported Dynamic Standing - Level of Assistance: 4: Min assist  End of Session PT - End of Session Equipment Utilized During Treatment: Gait belt Activity Tolerance:  (Limited by dizziness) Patient left: in bed;with call bell/phone within reach   GP     [name] [facility] 06/19/2012, 11:30 AM [phone]

## 2012-06-19 NOTE — Patient Instructions (Signed)
Vestibular Eval:   Pt reports feeling dizzy all the time however gets worse with head movement and position changes.  No nystagmus observed at rest.  Saccades and smooth pursuits normal.  Performed L and R dix-hallpike with pt reporting increased dizziness with both more on L than R however no spinning or nystagmus present.  Also tested horizontal canals with similar effect (L dizziness > R).  Educated pt to perform position change and wait for dizziness to get better before continuing.  Also educated pt to stare at stationary object if dizziness becomes worse.  Pt reported she did not feel should could tolerate car ride for d/c today so discussed with RN possibly of receiving Antivert prior to d/c.  Pt also reluctantly agreed to have daughter present for mobility for first couple days at home for safety.  Pt agreed to HHPT with case manager prior to visit as she reported decreased ability to get to outpatient.

## 2012-06-19 NOTE — Progress Notes (Signed)
Physical Therapy Treatment Patient Details Name: [name] MRN: [id number] DOB: Dec 12, 1924 Today's Date: 06/19/2012 Time: 4098-1191 PT Time Calculation (min): 15 min  PT Assessment / Plan / Recommendation Comments on Treatment Session  Prior PT requested this PT to perform vestibular exam as pt with increased dizziness.  Pt reports feeling dizzy all the time however gets worse with head movement and position changes.  No nystagmus observed at rest.  Saccades and smooth pursuits normal.  Performed L and R dix-hallpike with pt reporting increased dizziness with both more on L than R however no spinning or nystagmus present.  Also tested horizontal canals with similar effect (L dizziness > R).  Educated pt to perform position change and wait for dizziness to get better before continuing.  Also educated pt to stare at stationary object if dizziness becomes worse.  Pt reported she did not feel should could tolerate car ride for d/c today so discussed with RN possibly of receiving Antivert prior to d/c.  Pt also reluctantly agreed to have daughter present for mobility for first couple days at home for safety.  Pt agreed to HHPT with case manager prior to visit as she reported decreased ability to get to outpatient.    Follow Up Recommendations  Home health PT;Supervision/Assistance - 24 hour    Barriers to Discharge        Equipment Recommendations  None recommended by PT    Recommendations for Other Services    Frequency Min 3X/week   Plan Discharge plan remains appropriate;Frequency remains appropriate    Precautions / Restrictions Precautions Precautions: Fall Restrictions Weight Bearing Restrictions: No   Pertinent Vitals/Pain No pain    Mobility  Bed Mobility Bed Mobility: Sit to Supine Sit to Supine: 7: Independent   Exercises     PT Diagnosis:    PT Problem List:   PT Treatment Interventions:     PT Goals Acute Rehab PT Goals Pt will go Sit to Stand: with modified  independence PT Goal: Sit to Stand - Progress: Progressing toward goal Pt will Ambulate: 51 - 150 feet PT Goal: Ambulate - Progress: Progressing toward goal  Visit Information  Last PT Received On: 06/19/12 Assistance Needed: +1    Subjective Data  Subjective: I think I'll be better at home. Patient Stated Goal: Home   Cognition  Overall Cognitive Status: Appears within functional limits for tasks assessed/performed Arousal/Alertness: Awake/alert Orientation Level: Appears intact for tasks assessed Behavior During Session: [facility] for tasks performed    Balance    End of Session PT - End of Session Equipment Utilized During Treatment: Gait belt Activity Tolerance: Other (comment) (dizziness) Patient left: in bed;with call bell/phone within reach Nurse Communication: Other (comment) (dizziness, Antivert prior to d/c?)   GP     [name],KATHrine E 06/19/2012, 12:37 PM Pager: [phone]

## 2012-06-19 NOTE — Discharge Summary (Addendum)
Physician Discharge Summary  [name] ZOX:[id number] DOB: Mar 27, 1925 DOA: 06/16/2012  PCP: Evette Georges, MD  Admit date: 06/16/2012 Discharge date: 06/19/2012  Recommendations for Outpatient Follow-up:  Home with outpatient PCP follow up for BP in 1 week Outpatient SW consult to evaluate home safety  Discharge Diagnoses:  Principal Problem:  *Accelerated hypertension  Active Problems:  BPPV (benign paroxysmal positional vertigo)  HYPERLIPIDEMIA  HYPERTENSION  CORONARY ARTERY DISEASE   Discharge Condition: fair  Diet recommendation: low sodium with nutrition supplements  Filed Weights   06/16/12 2031 06/18/12 0604 06/19/12 0501  Weight: 32.886 kg (72 lb 8 oz) 33.022 kg (72 lb 12.8 oz) 33.475 kg (73 lb 12.8 oz)    History of present illness:  Please refer admission H&P for detail. In brief, 78 year old female with history of HTN, CAD and history of ovarian cancer who presents with generalized weakness and headaches progressively getting worse over past few days prior to admission.  Hospital Course:   Dizziness with Accelerated hypertension  She informs being compliant to her medications at home. Head CT done on admission unremarkable.  Resumed her home meds including metoprolol nifedipine and ramipril. On admission was also on clonidine with better control for blood pressure. switch to hydralazine slowly and discontinued her clonidine . Her BP much improved now and will discharge her on her usual home BP meds and add a low dose hydralazine ( 10 mg tid) -orthostasis negative. -Appreciate PT and nutrition eval  - Stable on telemetry monitoring  -Symptoms of dizziness have been ongoing for past 6-8 weeks now and worsened over the last 1 week.  -Symptoms are improved now with good control of blood pressure however she does get little dizzy with ambulation and likely has some component of benign positional vertigo with horizontal nystagmus on exam. .i have added  a  low-dose meclizine which has been helping her with the dizziness and will give a short course on discharge.  Seen by PT and no further recommendations. Has assistance at home.   CAD  Continue simvastatin and aspirin    Procedures:  none  Consultations:  none  Discharge Exam: Filed Vitals:   06/19/12 0501  BP: 137/56  Pulse: 61  Temp: 98.2 F (36.8 C)  Resp: 16   Filed Vitals:   06/18/12 2204 06/18/12 2333 06/18/12 2337 06/19/12 0501  BP:  112/41 115/49 137/56  Pulse:  68  61  Temp:  97.5 F (36.4 C)  98.2 F (36.8 C)  TempSrc:  Oral  Oral  Resp:  16  16  Height:      Weight:    33.475 kg (73 lb 12.8 oz)  SpO2: 96% 98%  100%    General: Elderly thin built female lying in bed in no acute distress  HE ENT: No pallor moist oral mucosa pupils , reactive bilaterally , mild b/l horizontal nystagmus Cardiovascular: Normal S1 and S2 no murmurs rub or gallop  Respiratory: Clear bilaterally no added sounds  Abdomen: Soft nontender bowel sounds present  Extremities: Warm no edema  CNS: AAO x3 nonfocal   Discharge Instructions   Medication List  As of 06/19/2012 11:16 AM   TAKE these medications         aspirin 81 MG tablet   Take 81 mg by mouth daily.      Calcium-Vitamin D 600-200 MG-UNIT Caps   Take 1 tablet by mouth daily.      feeding supplement Liqd   Take 237 mLs by mouth  daily at 2 PM daily at 2 PM.      fish oil-omega-3 fatty acids 1000 MG capsule   Take 1 g by mouth daily. 1 tab po qd      hydrALAZINE 10 MG tablet   Commonly known as: APRESOLINE   Take 1 tablet (10 mg total) by mouth 3 (three) times daily.      meclizine 12.5 MG tablet   Commonly known as: ANTIVERT   Take 1 tablet (12.5 mg total) by mouth 3 (three) times daily as needed for dizziness.      metoprolol 50 MG tablet   Commonly known as: LOPRESSOR   Take 75 mg by mouth 2 (two) times daily.      multivitamin tablet   Take 1 tablet by mouth daily.      NIFEdipine 90 MG 24 hr tablet    Commonly known as: ADALAT CC   Take 90 mg by mouth daily.      ramipril 10 MG capsule   Commonly known as: ALTACE   Take 10 mg by mouth daily.      simvastatin 20 MG tablet   Commonly known as: ZOCOR   Take 20 mg by mouth every evening.      zolpidem 5 MG tablet   Commonly known as: AMBIEN   Take 5 mg by mouth at bedtime as needed. sleep           Follow-up Information    Follow up with TODD,JEFFREY ALLEN, MD in 1 week.   Contact information:   [address] [place] [address] [phone]           The results of significant diagnostics from this hospitalization (including imaging, microbiology, ancillary and laboratory) are listed below for reference.    Significant Diagnostic Studies: Dg Chest 2 View  06/16/2012  *RADIOLOGY REPORT*  Clinical Data: Cough for 1 week.  CHEST - 2 VIEW  Comparison: PA and lateral chest 05/01/2012.  Findings: The chest is hyperexpanded but the lungs are clear. No pneumothorax of pleural fluid. Heart size normal.  IMPRESSION: No acute finding.  Original Report Authenticated By: Bernadene Bell. Maricela Curet, M.D.   Ct Head Wo Contrast  06/16/2012  *RADIOLOGY REPORT*  Clinical Data: Headache, hypertension.  CT HEAD WITHOUT CONTRAST  Technique:  Contiguous axial images were obtained from the base of the skull through the vertex without contrast.  Comparison: November 14, 2010.  Findings: Bony calvarium is intact except for prominent venous lakes seen posteriorly which are unchanged compared to prior exam. Mild diffuse cortical atrophy is noted.  No mass effect or midline shift is noted.  Ventricular size is within normal limits.  There is no evidence of mass, hemorrhage or acute infarction.  IMPRESSION: No acute intracranial abnormality seen.  Original Report Authenticated By: Venita Sheffield., M.D.    Microbiology: No results found for this or any previous visit (from the past 240 hour(s)).   Labs: Basic Metabolic  Panel:  Lab 06/17/12 0424 06/16/12 2125 06/16/12 1649  NA 138 138 136  K 5.0 3.7 4.1  CL 104 102 99  CO2 26 29 27   GLUCOSE 117* 218* 156*  BUN 14 11 12   CREATININE 0.95 0.64 0.66  CALCIUM 9.0 9.1 9.5  MG -- 2.0 --  PHOS -- 3.3 --   Liver Function Tests:  Lab 06/17/12 0424 06/16/12 2125 06/16/12 1649  AST 17 16 18   ALT 11 11 12   ALKPHOS 72 82 93  BILITOT 0.2*  0.2* 0.2*  PROT 5.6* 6.2 7.0  ALBUMIN 2.9* 3.2* 3.7   No results found for this basename: LIPASE:5,AMYLASE:5 in the last 168 hours No results found for this basename: AMMONIA:5 in the last 168 hours CBC:  Lab 06/17/12 0424 06/16/12 2125 06/16/12 1649  WBC 8.5 10.6* 13.3*  NEUTROABS -- 7.8* 10.1*  HGB 11.9* 13.1 14.1  HCT 36.5 39.8 42.2  MCV 89.2 88.6 87.9  PLT 260 275 303   Cardiac Enzymes: No results found for this basename: CKTOTAL:5,CKMB:5,CKMBINDEX:5,TROPONINI:5 in the last 168 hours BNP: BNP (last 3 results) No results found for this basename: PROBNP:3 in the last 8760 hours CBG: No results found for this basename: GLUCAP:5 in the last 168 hours  Time coordinating discharge: 40 minutes  Signed:  Eddie North  [facility] 06/19/2012, 11:16 AM

## 2012-06-19 NOTE — Progress Notes (Signed)
Talked to patient about DCP; patient stated that her daughter is going to stay with her to provide the 24hr care and also is agreeable to HHPT; HHC choices provided, patient chose [facility]. Norberta Keens RN with [facility] called for arrangements.

## 2012-06-24 ENCOUNTER — Telehealth: Payer: Self-pay | Admitting: Family Medicine

## 2012-06-24 NOTE — Telephone Encounter (Signed)
Offer Wednesday at 11:15

## 2012-06-24 NOTE — Telephone Encounter (Signed)
Pt called and needs a hosp fup this wk to see Dr Tawanna Cooler re: dizzyness and High bp. Pls advise.

## 2012-06-24 NOTE — Telephone Encounter (Signed)
Called pt and offered ov with Dr Tawanna Cooler for [facility] 06/26/12 at 11:15 am as noted. Pt has been schd.

## 2012-06-26 ENCOUNTER — Encounter: Payer: Self-pay | Admitting: Family Medicine

## 2012-06-26 ENCOUNTER — Ambulatory Visit (INDEPENDENT_AMBULATORY_CARE_PROVIDER_SITE_OTHER): Payer: Medicare Other | Admitting: Family Medicine

## 2012-06-26 ENCOUNTER — Telehealth: Payer: Self-pay | Admitting: Family Medicine

## 2012-06-26 VITALS — BP 180/100 | HR 60 | Temp 98.0°F | Wt 76.0 lb

## 2012-06-26 DIAGNOSIS — I10 Essential (primary) hypertension: Secondary | ICD-10-CM

## 2012-06-26 MED ORDER — HYDROCHLOROTHIAZIDE 12.5 MG PO TABS: 12.5000 mg | ORAL_TABLET | Freq: Every day | ORAL | Status: DC

## 2012-06-26 NOTE — Patient Instructions (Signed)
Continue your current medications  Add the hydrochlorothiazide one tablet daily  We will get you set up for a renal ultrasound to rule out renal artery stenosis  Followup 3 days after your study

## 2012-06-26 NOTE — Telephone Encounter (Signed)
Open in error

## 2012-06-26 NOTE — Progress Notes (Signed)
  Subjective:    Patient ID: [name], female    DOB: 1925/05/02, 78 y.o.   MRN: [id number]  HPI [name] is an 78 year old widowed female nonsmoker who comes in today accompanied by her daughter for followup of hypertension  She was hospitalized recently from August 18 2 August 21 for evaluation of hypertension. Her current medications are hydralazine 10 mg 3 times daily, Lopressor 75 mg twice a day, nifedipine 90 mg daily, Eldepryl 10 mg daily. BP today 180/100.  She has a history of vascular disease in the past I wonder she doesn't have renal artery is disease causing her blood pressure to be elevated and uncontrolled with medication.   Review of Systems General and cardiovascular systems otherwise negative    Objective:   Physical Exam Well-developed well-nourished thin female BP 180/100       Assessment & Plan:  Hypertension not at goal,,,,,,,,,,

## 2012-06-27 ENCOUNTER — Other Ambulatory Visit: Payer: Self-pay | Admitting: Family Medicine

## 2012-06-27 DIAGNOSIS — I10 Essential (primary) hypertension: Secondary | ICD-10-CM

## 2012-07-05 ENCOUNTER — Other Ambulatory Visit: Payer: Self-pay | Admitting: Cardiology

## 2012-07-05 DIAGNOSIS — I10 Essential (primary) hypertension: Secondary | ICD-10-CM

## 2012-07-15 DIAGNOSIS — R269 Unspecified abnormalities of gait and mobility: Secondary | ICD-10-CM

## 2012-07-15 DIAGNOSIS — I10 Essential (primary) hypertension: Secondary | ICD-10-CM

## 2012-07-15 DIAGNOSIS — H8110 Benign paroxysmal vertigo, unspecified ear: Secondary | ICD-10-CM

## 2012-07-15 DIAGNOSIS — IMO0001 Reserved for inherently not codable concepts without codable children: Secondary | ICD-10-CM

## 2012-07-17 ENCOUNTER — Ambulatory Visit: Payer: Medicare Other | Admitting: Cardiology

## 2012-07-18 ENCOUNTER — Encounter: Payer: Self-pay | Admitting: Gastroenterology

## 2012-09-10 ENCOUNTER — Telehealth: Payer: Self-pay | Admitting: Family Medicine

## 2012-09-10 NOTE — Telephone Encounter (Signed)
Jovita Kussmaul is the pt's daughter. She is requesting to have [place] call her mother, the patient, and tell her it is time for her "check up".  Mrs. Wallace Cullens has tried repeatedly to get her mother to come in and she refuses. She says if we call and tell her it's time to come in, she would. Mrs. Wallace Cullens states her mother has gotten down to 70lbs. She lays around all day under the covers. Pt continues to say "I am so sick, I feel awlful". Daughter would like [place] to call her at [phone] to hopefully schedule that appointment.

## 2012-09-10 NOTE — Telephone Encounter (Signed)
I called the patient and she states that she is "fine" and does not want to come in for an office visit.  Please advise?

## 2012-10-21 ENCOUNTER — Other Ambulatory Visit: Payer: Self-pay | Admitting: *Deleted

## 2012-10-21 MED ORDER — ZOLPIDEM TARTRATE 5 MG PO TABS: 5.0000 mg | ORAL_TABLET | Freq: Every evening | ORAL | Status: DC | PRN

## 2012-12-11 ENCOUNTER — Other Ambulatory Visit: Payer: Self-pay | Admitting: Cardiology

## 2013-01-10 ENCOUNTER — Other Ambulatory Visit: Payer: Self-pay | Admitting: Cardiology

## 2013-01-14 ENCOUNTER — Other Ambulatory Visit: Payer: Self-pay

## 2013-01-14 MED ORDER — SIMVASTATIN 20 MG PO TABS: 20.0000 mg | ORAL_TABLET | Freq: Every evening | ORAL | Status: DC

## 2013-01-14 NOTE — Telephone Encounter (Signed)
..   Requested Prescriptions   Signed Prescriptions Disp Refills  . simvastatin (ZOCOR) 20 MG tablet 30 tablet 1    Sig: Take 1 tablet (20 mg total) by mouth every evening.    Authorizing Provider: [name]    Ordering User: Christella Hartigan, Arney Mayabb M  ..Patient needs to contact office to schedule  Appointment  for future refills.Ph:[phone]. Thank you.

## 2013-01-16 ENCOUNTER — Other Ambulatory Visit: Payer: Self-pay

## 2013-01-16 MED ORDER — METOPROLOL TARTRATE 50 MG PO TABS: 75.0000 mg | ORAL_TABLET | Freq: Two times a day (BID) | ORAL | Status: DC

## 2013-01-16 NOTE — Telephone Encounter (Signed)
..   Requested Prescriptions   Signed Prescriptions Disp Refills  . metoprolol (LOPRESSOR) 50 MG tablet 90 tablet 6    Sig: Take 1.5 tablets (75 mg total) by mouth 2 (two) times daily.    Authorizing Provider: [name]    Ordering User: Christella Hartigan, Marquerite Forsman Judie Petit

## 2013-02-03 ENCOUNTER — Other Ambulatory Visit: Payer: Self-pay | Admitting: Cardiology

## 2013-02-08 ENCOUNTER — Other Ambulatory Visit: Payer: Self-pay | Admitting: Cardiology

## 2013-03-12 ENCOUNTER — Ambulatory Visit (INDEPENDENT_AMBULATORY_CARE_PROVIDER_SITE_OTHER): Payer: Medicare Other | Admitting: Family Medicine

## 2013-03-12 ENCOUNTER — Encounter: Payer: Self-pay | Admitting: Family Medicine

## 2013-03-12 VITALS — BP 148/88 | Temp 98.3°F | Wt <= 1120 oz

## 2013-03-12 DIAGNOSIS — I10 Essential (primary) hypertension: Secondary | ICD-10-CM

## 2013-03-12 DIAGNOSIS — E785 Hyperlipidemia, unspecified: Secondary | ICD-10-CM

## 2013-03-12 DIAGNOSIS — R634 Abnormal weight loss: Secondary | ICD-10-CM

## 2013-03-12 LAB — CBC WITH DIFFERENTIAL/PLATELET
Basophils Relative: 0.4 % (ref 0.0–3.0)
Eosinophils Relative: 1.4 % (ref 0.0–5.0)
HCT: 43 % (ref 36.0–46.0)
Hemoglobin: 14.6 g/dL (ref 12.0–15.0)
Lymphs Abs: 2.6 10*3/uL (ref 0.7–4.0)
MCV: 88.7 fl (ref 78.0–100.0)
Monocytes Absolute: 0.9 10*3/uL (ref 0.1–1.0)
Monocytes Relative: 6.4 % (ref 3.0–12.0)
Neutro Abs: 10.1 10*3/uL — ABNORMAL HIGH (ref 1.4–7.7)
RBC: 4.85 Mil/uL (ref 3.87–5.11)
WBC: 13.8 10*3/uL — ABNORMAL HIGH (ref 4.5–10.5)

## 2013-03-12 LAB — POCT URINALYSIS DIPSTICK
Bilirubin, UA: NEGATIVE
Blood, UA: NEGATIVE
Glucose, UA: NEGATIVE
Leukocytes, UA: NEGATIVE
Nitrite, UA: NEGATIVE

## 2013-03-12 LAB — BASIC METABOLIC PANEL
GFR: 57.9 mL/min — ABNORMAL LOW (ref >60.00)
Glucose, Bld: 94 mg/dL (ref 70–99)
Potassium: 5.2 mEq/L — ABNORMAL HIGH (ref 3.5–5.1)
Sodium: 139 mEq/L (ref 135–145)

## 2013-03-12 LAB — HEPATIC FUNCTION PANEL
AST: 22 U/L (ref 0–37)
Albumin: 3.7 g/dL (ref 3.5–5.2)
Total Bilirubin: 0.4 mg/dL (ref 0.3–1.2)

## 2013-03-12 MED ORDER — ZOLPIDEM TARTRATE 5 MG PO TABS: 5.0000 mg | ORAL_TABLET | Freq: Every evening | ORAL | Status: DC | PRN

## 2013-03-12 NOTE — Patient Instructions (Signed)
Continue your current medications  Labs today  Set up a time the first week in June for a complete physical examination

## 2013-03-12 NOTE — Progress Notes (Signed)
  Subjective:    Patient ID: [name], female    DOB: [birth year], 79 y.o.   MRN: [id number]  HPI [name] is a 79 year old female who comes in today for evaluation of 2 lesions on her face, concerned about her blood sugar, and weight loss.  Her weight in August 2013 was 76 pounds today she's 26  We will get her set up for physical examination for evaluation of this problem  She has 2 lesions one on her nose and one on her right ear that are red and irritated  She has underlying hypertension and is taking Lopressor 75 mg twice a day, nifedipine 90 mg daily, [place] 10 mg daily. BP 148/88  She's not taken a diuretic  She also wants a refill on her sleeping pills   Review of Systems    review of systems otherwise negative Objective:   Physical Exam Thin female no acute distress examination of the nose shows a excoriated lesion right side of nose also a red lesion right upper ear         Assessment & Plan:  2 abnormal appearing lesions return for removal ASAP  Hypertension refill medication  Sleep dysfunction refill medication  Labs today set up for physical ASAP

## 2013-04-02 ENCOUNTER — Encounter: Payer: Self-pay | Admitting: Family Medicine

## 2013-04-02 ENCOUNTER — Ambulatory Visit (INDEPENDENT_AMBULATORY_CARE_PROVIDER_SITE_OTHER): Payer: Medicare Other | Admitting: Family Medicine

## 2013-04-02 VITALS — BP 140/90 | Temp 98.2°F | Ht 60.0 in | Wt <= 1120 oz

## 2013-04-02 DIAGNOSIS — I2510 Atherosclerotic heart disease of native coronary artery without angina pectoris: Secondary | ICD-10-CM

## 2013-04-02 DIAGNOSIS — C569 Malignant neoplasm of unspecified ovary: Secondary | ICD-10-CM

## 2013-04-02 DIAGNOSIS — B369 Superficial mycosis, unspecified: Secondary | ICD-10-CM

## 2013-04-02 DIAGNOSIS — C562 Malignant neoplasm of left ovary: Secondary | ICD-10-CM

## 2013-04-02 DIAGNOSIS — I10 Essential (primary) hypertension: Secondary | ICD-10-CM

## 2013-04-02 DIAGNOSIS — E785 Hyperlipidemia, unspecified: Secondary | ICD-10-CM

## 2013-04-02 MED ORDER — NIFEDIPINE ER OSMOTIC RELEASE 90 MG PO TB24: ORAL_TABLET | ORAL | Status: DC

## 2013-04-02 MED ORDER — METOPROLOL TARTRATE 50 MG PO TABS: 75.0000 mg | ORAL_TABLET | Freq: Two times a day (BID) | ORAL | Status: DC

## 2013-04-02 MED ORDER — ZOLPIDEM TARTRATE 5 MG PO TABS: 5.0000 mg | ORAL_TABLET | Freq: Every evening | ORAL | Status: DC | PRN

## 2013-04-02 MED ORDER — RAMIPRIL 10 MG PO CAPS: ORAL_CAPSULE | ORAL | Status: DC

## 2013-04-02 NOTE — Progress Notes (Signed)
  Subjective:    Patient ID: [name], female    DOB: 08-Sep-1925, 79 y.o.   MRN: [id number]  HPI Lucrecia is a 79 year old single female X. Smoker who comes in today for general physical examination because of a history of hyperlipidemia, hypertension, coronary disease, peripheral vascular disease status post carotid endarterectomies, history of ovarian cancer, and weight loss, sleep dysfunction  Her weight is stable at 69 pounds. She continues to live independently and can perform all her ADLs. She also continues to drive. Her cognitive function is normal  She gets routine eye care, dental care, does not need colonoscopies or mammograms at this point in her life  Cognitive function normal she basically with stays in the house home health safety reviewed no issues identified, no guns in the house, she does have a health care power of attorney and living well   Review of Systems  Constitutional: Negative.   HENT: Negative.   Eyes: Negative.   Respiratory: Negative.   Cardiovascular: Negative.   Gastrointestinal: Negative.   Genitourinary: Negative.   Musculoskeletal: Negative.   Neurological: Negative.   Psychiatric/Behavioral: Negative.        Objective:   Physical Exam  Constitutional:  Very thin  HENT:  Head: Normocephalic and atraumatic.  Right Ear: External ear normal.  Left Ear: External ear normal.  Nose: Nose normal.  Mouth/Throat: Oropharynx is clear and moist.  Eyes: EOM are normal. Pupils are equal, round, and reactive to light.  Neck: Normal range of motion. Neck supple. No thyromegaly present.  Cardiovascular: Normal rate, regular rhythm, normal heart sounds and intact distal pulses.  Exam reveals no gallop and no friction rub.   No murmur heard. Bilateral soft carotid bruits  Pulmonary/Chest: Effort normal and breath sounds normal.  Abdominal: Soft. Bowel sounds are normal. She exhibits no distension and no mass. There is no tenderness. There is no  rebound.  Genitourinary:  Bilateral breast exam normal  Musculoskeletal: Normal range of motion.  Lymphadenopathy:    She has no cervical adenopathy.  Neurological: She is alert. She has normal reflexes. No cranial nerve deficit. She exhibits normal muscle tone. Coordination normal.  Skin: Skin is warm and dry.  Psychiatric: She has a normal mood and affect. Her behavior is normal. Judgment and thought content normal.          Assessment & Plan:  Hypertension at goal continue current medications  Hyperlipidemia continue Zocor although it this juncture we might consider stopping it  Sleep dysfunction continue Ambien each bedtime  Weight loss again caloric supplementation discussed.  Coronary disease asymptomatic  Bilateral carotid disease asymptomatic

## 2013-04-02 NOTE — Patient Instructions (Signed)
Stop the Zocor  Continue the Altase Procardia and Lopressor for good blood pressure control  Milkshake supplements 3 times daily  Return in 6 months for followup sooner if any problems

## 2013-04-24 ENCOUNTER — Ambulatory Visit: Payer: Medicare Other | Admitting: Family Medicine

## 2013-04-28 ENCOUNTER — Telehealth: Payer: Self-pay | Admitting: Family Medicine

## 2013-04-28 ENCOUNTER — Ambulatory Visit (INDEPENDENT_AMBULATORY_CARE_PROVIDER_SITE_OTHER): Payer: Medicare Other | Admitting: Family Medicine

## 2013-04-28 ENCOUNTER — Encounter: Payer: Self-pay | Admitting: Family Medicine

## 2013-04-28 DIAGNOSIS — L989 Disorder of the skin and subcutaneous tissue, unspecified: Secondary | ICD-10-CM

## 2013-04-28 NOTE — Telephone Encounter (Signed)
Pt's daughter states Dr Tawanna Cooler referred pt to Dr Terri Piedra for skin cancer removal. States that he told pt she could get in with them right away. Daughter states mother cannot get in w/ [facility] until 05/28/13. They are asking if Dr. Tawanna Cooler can get [facility] in ASAP. Please advise.

## 2013-04-28 NOTE — Progress Notes (Signed)
  Subjective:    Patient ID: [name], female    DOB: 1924-12-08, 79 y.o.   MRN: [id number]  HPI [name] comes in today for removal of 2 lesions  She has very small lesion on her right ear lobe and she has a rather large lesion on her nose. The right earlobe lesion I think I could easily do however the lesion on her nose is rather extensive and I think ought to be treated by dermatologist therefore we did note treatment today.   Review of Systems She's had a history of previous ovarian cancer no skin cancer    Objective:   Physical Exam  Lesion on her right earlobe and nose which she the one on her nose rather extensive      Assessment & Plan:  2 lesions rule out cancer refer to dermatology

## 2013-04-28 NOTE — Patient Instructions (Addendum)
Call and make an appointment with Dr. Para Skeans dermatologist

## 2013-06-26 ENCOUNTER — Encounter (HOSPITAL_COMMUNITY): Payer: Self-pay

## 2013-06-26 ENCOUNTER — Emergency Department (HOSPITAL_COMMUNITY)
Admission: EM | Admit: 2013-06-26 | Discharge: 2013-06-26 | Disposition: A | Discharge status: Home / Self Care | Payer: Medicare Other | Attending: Emergency Medicine | Admitting: Emergency Medicine

## 2013-06-26 DIAGNOSIS — Z9861 Coronary angioplasty status: Secondary | ICD-10-CM | POA: Insufficient documentation

## 2013-06-26 DIAGNOSIS — I2510 Atherosclerotic heart disease of native coronary artery without angina pectoris: Secondary | ICD-10-CM | POA: Insufficient documentation

## 2013-06-26 DIAGNOSIS — Z860100 Personal history of colon polyps, unspecified: Secondary | ICD-10-CM | POA: Insufficient documentation

## 2013-06-26 DIAGNOSIS — Z7982 Long term (current) use of aspirin: Secondary | ICD-10-CM | POA: Insufficient documentation

## 2013-06-26 DIAGNOSIS — Z862 Personal history of diseases of the blood and blood-forming organs and certain disorders involving the immune mechanism: Secondary | ICD-10-CM | POA: Insufficient documentation

## 2013-06-26 DIAGNOSIS — Z8639 Personal history of other endocrine, nutritional and metabolic disease: Secondary | ICD-10-CM | POA: Insufficient documentation

## 2013-06-26 DIAGNOSIS — I509 Heart failure, unspecified: Secondary | ICD-10-CM | POA: Insufficient documentation

## 2013-06-26 DIAGNOSIS — F17200 Nicotine dependence, unspecified, uncomplicated: Secondary | ICD-10-CM | POA: Insufficient documentation

## 2013-06-26 DIAGNOSIS — Z90710 Acquired absence of both cervix and uterus: Secondary | ICD-10-CM | POA: Insufficient documentation

## 2013-06-26 DIAGNOSIS — Z8659 Personal history of other mental and behavioral disorders: Secondary | ICD-10-CM | POA: Insufficient documentation

## 2013-06-26 DIAGNOSIS — Z79899 Other long term (current) drug therapy: Secondary | ICD-10-CM | POA: Insufficient documentation

## 2013-06-26 DIAGNOSIS — Z8719 Personal history of other diseases of the digestive system: Secondary | ICD-10-CM | POA: Insufficient documentation

## 2013-06-26 DIAGNOSIS — Z8669 Personal history of other diseases of the nervous system and sense organs: Secondary | ICD-10-CM | POA: Insufficient documentation

## 2013-06-26 DIAGNOSIS — E119 Type 2 diabetes mellitus without complications: Secondary | ICD-10-CM | POA: Insufficient documentation

## 2013-06-26 DIAGNOSIS — R600 Localized edema: Secondary | ICD-10-CM

## 2013-06-26 DIAGNOSIS — Z8679 Personal history of other diseases of the circulatory system: Secondary | ICD-10-CM | POA: Insufficient documentation

## 2013-06-26 DIAGNOSIS — I10 Essential (primary) hypertension: Secondary | ICD-10-CM | POA: Insufficient documentation

## 2013-06-26 DIAGNOSIS — Z8543 Personal history of malignant neoplasm of ovary: Secondary | ICD-10-CM | POA: Insufficient documentation

## 2013-06-26 DIAGNOSIS — R609 Edema, unspecified: Secondary | ICD-10-CM | POA: Insufficient documentation

## 2013-06-26 LAB — COMPREHENSIVE METABOLIC PANEL
AST: 19 U/L (ref 0–37)
Alkaline Phosphatase: 84 U/L (ref 39–117)
BUN: 14 mg/dL (ref 6–23)
CO2: 28 mEq/L (ref 19–32)
Chloride: 102 mEq/L (ref 96–112)
Creatinine, Ser: 0.7 mg/dL (ref 0.50–1.10)
GFR calc non Af Amer: 76 mL/min — ABNORMAL LOW (ref >90)
Total Bilirubin: 0.2 mg/dL — ABNORMAL LOW (ref 0.3–1.2)

## 2013-06-26 LAB — PRO B NATRIURETIC PEPTIDE: Pro B Natriuretic peptide (BNP): 944.8 pg/mL — ABNORMAL HIGH (ref 0–450)

## 2013-06-26 LAB — CBC
MCH: 28.9 pg (ref 26.0–34.0)
Platelets: 268 10*3/uL (ref 150–400)
RBC: 4.56 MIL/uL (ref 3.87–5.11)
RDW: 14.6 % (ref 11.5–15.5)

## 2013-06-26 MED ORDER — FUROSEMIDE 20 MG PO TABS
20.0000 mg | ORAL_TABLET | Freq: Once | ORAL | Status: AC
  Administered 2013-06-26: 20 mg via ORAL
  Filled 2013-06-26: qty 1

## 2013-06-26 MED ORDER — FUROSEMIDE 20 MG PO TABS: 20.0000 mg | ORAL_TABLET | Freq: Every day | ORAL | Status: DC

## 2013-06-26 NOTE — ED Notes (Signed)
Pt states she has had bilateral feet swelling x2 days. Minimal pain. Was taken off lasix a month ago

## 2013-06-26 NOTE — ED Provider Notes (Signed)
CSN: [id number]     Arrival date & time 06/26/13  1616 History   First MD Initiated Contact with Patient 06/26/13 1713     Chief Complaint  Patient presents with  . Leg Swelling   (Consider location/radiation/quality/duration/timing/severity/associated sxs/prior Treatment) HPI Comments: Patient with h/o CAD, question of CHF but had normal ECHO in 2013 -- presents with c/o bilateral lower extremity swelling for the past 3 days. Patient was worse yesterday. She denies pain, redness or warmth of the lower extremities. She denies shortness of breath or chest pain. Patient has been on Lasix in the past but this was discontinued several months ago. She is on a calcium channel blocker. Patient reports eating well however her daughter states that the patient has poor oral intake. No other medical complaints. Patient states that she feels well. Onset of symptoms gradual. Course is constant. Nothing makes symptoms better or worse.  The history is provided by the patient, medical records and a relative.    Past Medical History  Diagnosis Date  . CAD (coronary artery disease)     prior stent to the LAD and RCA;  Last [facility] 9/05:  ostial LAD 25%, mid 25%, ostial diagonal 30%, proximal circumflex 25%, ostial OM1 25%, mid RCA 99% in-stent restenosis, then 50 and 40%.  PCI: Taxus DES to the mid RCA.  Last Myoview 5/12: EF 69%, inferior scar with minimal peri-infarct ischemia.  Last echo 2/03: EF 45-55%.  . Depression   . DM (diabetes mellitus)   . Hyperlipidemia   . Colon polyp   . Hyperplastic rectal polyp   . Ovarian cancer   . Cataract   . Bowel obstruction   . H/O: hysterectomy   . HTN (hypertension)   . Carotid stenosis     s/p right CEA 2000;  Dopplers 10/12: RICA 40-59%, right CEA okay, LICA 100%-followup one year   Past Surgical History  Procedure Laterality Date  . Angioplasty    . Abdominal hysterectomy     Family History  Problem Relation Age of Onset  . Cancer Other     colon    History  Substance Use Topics  . Smoking status: Current Every Day Smoker -- 0.50 packs/day    Last Attempt to Quit: 01/01/2012  . Smokeless tobacco: Not on file  . Alcohol Use: No   OB History   Grav Para Term Preterm Abortions TAB SAB Ect Mult Living                 Review of Systems  Constitutional: Negative for fever.  HENT: Negative for sore throat and rhinorrhea.   Eyes: Negative for redness.  Respiratory: Negative for cough, shortness of breath and wheezing.   Cardiovascular: Positive for leg swelling. Negative for chest pain.  Gastrointestinal: Negative for nausea, vomiting, abdominal pain and diarrhea.  Genitourinary: Negative for dysuria.  Musculoskeletal: Negative for myalgias.  Skin: Negative for rash.  Neurological: Negative for headaches.    Allergies  Review of patient's allergies indicates no known allergies.  Home Medications   Current Outpatient Rx  Name  Route  Sig  Dispense  Refill  . aspirin 81 MG tablet   Oral   Take 81 mg by mouth daily.           . Calcium Carbonate-Vitamin D (CALCIUM-VITAMIN D) 600-200 MG-UNIT CAPS   Oral   Take 1 tablet by mouth every evening.          . fish oil-omega-3 fatty acids 1000 MG capsule  Oral   Take 1 g by mouth daily. 1 tab po qd         . metoprolol (LOPRESSOR) 100 MG tablet   Oral   Take 50 mg by mouth 2 (two) times daily.         . Multiple Vitamin (MULTIVITAMIN) tablet   Oral   Take 1 tablet by mouth daily.           Marland Kitchen NIFEdipine (PROCARDIA XL/ADALAT-CC) 90 MG 24 hr tablet   Oral   Take 90 mg by mouth daily.         . nitroGLYCERIN (NITROSTAT) 0.4 MG SL tablet   Sublingual   Place 0.4 mg under the tongue every 5 (five) minutes as needed for chest pain.         . ramipril (ALTACE) 10 MG capsule   Oral   Take 10 mg by mouth at bedtime.         Marland Kitchen zolpidem (AMBIEN) 5 MG tablet   Oral   Take 1 tablet (5 mg total) by mouth at bedtime as needed. sleep   90 tablet   3    BP  139/71  Pulse 115  Temp(Src) 98.7 F (37.1 C) (Oral)  Resp 18  Ht 5' (1.524 m)  Wt 65 lb 4 oz (29.597 kg)  BMI 12.74 kg/m2  SpO2 95% Physical Exam  Nursing note and vitals reviewed. Constitutional: She appears well-developed and well-nourished.  Cachectic.  HENT:  Head: Normocephalic and atraumatic.  Eyes: Conjunctivae are normal. Right eye exhibits no discharge. Left eye exhibits no discharge.  Neck: Normal range of motion. Neck supple. No JVD present.  Cardiovascular: Normal rate, regular rhythm and normal heart sounds.   Pulmonary/Chest: Effort normal and breath sounds normal. No respiratory distress. She has no wheezes. She has no rales.  Abdominal: Soft. There is no tenderness. There is no rebound and no guarding.  Musculoskeletal:  1-2+ pitting edema of the feet and ankles, 1+ pitting edema to mid calves, symmetric bilaterally  Neurological: She is alert.  Skin: Skin is warm and dry. No rash noted. No erythema.  Psychiatric: She has a normal mood and affect.    ED Course  Procedures (including critical care time) Labs Review Labs Reviewed  COMPREHENSIVE METABOLIC PANEL - Abnormal; Notable for the following:    Glucose, Bld 103 (*)    Albumin 3.1 (*)    Total Bilirubin 0.2 (*)    GFR calc non Af Amer 76 (*)    GFR calc Af Amer 88 (*)    All other components within normal limits  CBC - Abnormal; Notable for the following:    WBC 11.0 (*)    All other components within normal limits  PRO B NATRIURETIC PEPTIDE - Abnormal; Notable for the following:    Pro B Natriuretic peptide (BNP) 944.8 (*)    All other components within normal limits   Imaging Review No results found.  Patient seen and examined. Work-up initiated.    Vital signs reviewed and are as follows: Filed Vitals:   06/26/13 1645  BP: 139/71  Pulse: 115  Temp: 98.7 F (37.1 C)  Resp: 18   Pt d/w and seen by Dr. Blinda Leatherwood. Labs reassuring. D/c to home on Lasix 20mg  qd. Pt to f/u with PCP. Patient  verbalizes understanding and agrees with plan.     MDM   1. Lower extremity edema    Lower extremity edema. No evidence of florid CHF, liver failure, renal dysfunction.  Will treat symptomatically with low-dose Lasix and have patient followup. She appears well. No evidence of infection. Do not suspect bilateral DVT. She's not in any respiratory distress. She'll be discharged to home.    Renne Crigler, PA-C 06/26/13 2048

## 2013-06-26 NOTE — ED Notes (Signed)
Pt sent her by primary Dr to have her lower extremities evaluated for swelling

## 2013-06-27 ENCOUNTER — Encounter: Payer: Self-pay | Admitting: Internal Medicine

## 2013-06-27 ENCOUNTER — Ambulatory Visit (INDEPENDENT_AMBULATORY_CARE_PROVIDER_SITE_OTHER): Payer: Medicare Other | Admitting: Internal Medicine

## 2013-06-27 VITALS — BP 140/80 | HR 73 | Temp 98.1°F | Resp 20 | Wt <= 1120 oz

## 2013-06-27 DIAGNOSIS — I10 Essential (primary) hypertension: Secondary | ICD-10-CM

## 2013-06-27 DIAGNOSIS — I2510 Atherosclerotic heart disease of native coronary artery without angina pectoris: Secondary | ICD-10-CM

## 2013-06-27 MED ORDER — FUROSEMIDE 20 MG PO TABS: 20.0000 mg | ORAL_TABLET | Freq: Every day | ORAL | Status: DC

## 2013-06-27 NOTE — Patient Instructions (Signed)
Limit your sodium (Salt) intake  Return in one month for follow-up 

## 2013-06-27 NOTE — Progress Notes (Signed)
Subjective:    Patient ID: [name], female    DOB: 1925/02/18, 79 y.o.   MRN: [id number]  HPI  79 year old patient who presents for followup. She presented to the ED yesterday with a 2 to three-day history of increasing pedal edema. She has a history of ischemic heart disease with normal LV function. She denies any symptoms of congestive heart failure other than lower extremity edema.  Evaluation in the ED included a BNP which was almost 1000. She was prescribed Lasix 20 mg which she has not yet filled at the drug store. She has treated hypertension and dyslipidemia  Past Medical History  Diagnosis Date  . CAD (coronary artery disease)     prior stent to the LAD and RCA;  Last [facility] 9/05:  ostial LAD 25%, mid 25%, ostial diagonal 30%, proximal circumflex 25%, ostial OM1 25%, mid RCA 99% in-stent restenosis, then 50 and 40%.  PCI: Taxus DES to the mid RCA.  Last Myoview 5/12: EF 69%, inferior scar with minimal peri-infarct ischemia.  Last echo 2/03: EF 45-55%.  . Depression   . DM (diabetes mellitus)   . Hyperlipidemia   . Colon polyp   . Hyperplastic rectal polyp   . Ovarian cancer   . Cataract   . Bowel obstruction   . H/O: hysterectomy   . HTN (hypertension)   . Carotid stenosis     s/p right CEA 2000;  Dopplers 10/12: RICA 40-59%, right CEA okay, LICA 100%-followup one year    History   Social History  . Marital Status: Widowed    Spouse Name: N/A    Number of Children: N/A  . Years of Education: N/A   Occupational History  . Not on file.   Social History Main Topics  . Smoking status: Current Every Day Smoker -- 0.50 packs/day    Last Attempt to Quit: 01/01/2012  . Smokeless tobacco: Not on file  . Alcohol Use: No  . Drug Use: No  . Sexual Activity: Not on file   Other Topics Concern  . Not on file   Social History Narrative  . No narrative on file    Past Surgical History  Procedure Laterality Date  . Angioplasty    . Abdominal hysterectomy       Family History  Problem Relation Age of Onset  . Cancer Other     colon    No Known Allergies  Current Outpatient Prescriptions on File Prior to Visit  Medication Sig Dispense Refill  . aspirin 81 MG tablet Take 81 mg by mouth daily.        . Calcium Carbonate-Vitamin D (CALCIUM-VITAMIN D) 600-200 MG-UNIT CAPS Take 1 tablet by mouth every evening.       . fish oil-omega-3 fatty acids 1000 MG capsule Take 1 g by mouth daily. 1 tab po qd      . metoprolol (LOPRESSOR) 100 MG tablet Take 50 mg by mouth 2 (two) times daily.      . Multiple Vitamin (MULTIVITAMIN) tablet Take 1 tablet by mouth daily.        Marland Kitchen NIFEdipine (PROCARDIA XL/ADALAT-CC) 90 MG 24 hr tablet Take 90 mg by mouth daily.      . nitroGLYCERIN (NITROSTAT) 0.4 MG SL tablet Place 0.4 mg under the tongue every 5 (five) minutes as needed for chest pain.      . ramipril (ALTACE) 10 MG capsule Take 10 mg by mouth at bedtime.      Marland Kitchen zolpidem (AMBIEN) 5  MG tablet Take 1 tablet (5 mg total) by mouth at bedtime as needed. sleep  90 tablet  3   No current facility-administered medications on file prior to visit.    BP 140/80  Pulse 73  Temp(Src) 98.1 F (36.7 C) (Oral)  Resp 20  Wt 65 lb (29.484 kg)  BMI 12.69 kg/m2  SpO2 92%       Review of Systems  Constitutional: Negative.   HENT: Negative for hearing loss, congestion, sore throat, rhinorrhea, dental problem, sinus pressure and tinnitus.   Eyes: Negative for pain, discharge and visual disturbance.  Respiratory: Negative for cough and shortness of breath.   Cardiovascular: Positive for leg swelling. Negative for chest pain and palpitations.  Gastrointestinal: Negative for nausea, vomiting, abdominal pain, diarrhea, constipation, blood in stool and abdominal distention.  Genitourinary: Negative for dysuria, urgency, frequency, hematuria, flank pain, vaginal bleeding, vaginal discharge, difficulty urinating, vaginal pain and pelvic pain.  Musculoskeletal: Negative  for joint swelling, arthralgias and gait problem.  Skin: Negative for rash.  Neurological: Negative for dizziness, syncope, speech difficulty, weakness, numbness and headaches.  Hematological: Negative for adenopathy.  Psychiatric/Behavioral: Negative for behavioral problems, dysphoric mood and agitation. The patient is not nervous/anxious.        Objective:   Physical Exam  Constitutional: She is oriented to person, place, and time. She appears well-developed and well-nourished.  Very thin almost cachectic. Blood pressure normal  HENT:  Head: Normocephalic.  Right Ear: External ear normal.  Left Ear: External ear normal.  Mouth/Throat: Oropharynx is clear and moist.  Eyes: Conjunctivae and EOM are normal. Pupils are equal, round, and reactive to light.  Neck: Normal range of motion. Neck supple. No JVD present. No thyromegaly present.  Cardiovascular: Normal rate, regular rhythm, normal heart sounds and intact distal pulses.   Pulmonary/Chest: Effort normal.  Breath sounds seemed a bit diminished at the right base but does  not appear to have dullness to percussion  Abdominal: Soft. Bowel sounds are normal. She exhibits no mass. There is no tenderness.  Musculoskeletal: Normal range of motion. She exhibits edema.  +1 ankle and pedal edema  Lymphadenopathy:    She has no cervical adenopathy.  Neurological: She is alert and oriented to person, place, and time.  Skin: Skin is warm and dry. No rash noted.  Psychiatric: She has a normal mood and affect. Her behavior is normal.          Assessment & Plan:   Ischemic heart disease with fluid overload. Continue furosemide 20 mg daily. She will report any shortness of breath or clinical worsening. recheck in one month Hypertension stable Dyslipidemia

## 2013-06-30 NOTE — ED Provider Notes (Signed)
Medical screening examination/treatment/procedure(s) were performed by non-physician practitioner and as supervising physician I was immediately available for consultation/collaboration.    Kacee Sukhu J. Berlie Persky, MD 06/30/13 0730 

## 2013-08-15 ENCOUNTER — Telehealth: Payer: Self-pay | Admitting: Family Medicine

## 2013-08-15 NOTE — Telephone Encounter (Signed)
Daughter would like for you to call pt and tell her she needs to come see Dr todd as a fu from her going to ED.  Cannot say that daughter instructed [place] to call or pt will not come.  Pt is struggling w/ dementia and its geting worse. Pt needs appt asap. If you have any questions. pls call daughter.

## 2013-08-18 NOTE — Telephone Encounter (Signed)
Please call and schedule patient for Wednesday at 11 am and give her a 30 minute appointment

## 2013-08-19 NOTE — Telephone Encounter (Signed)
Pt states she has a funeral on Wed. appt made for thurs at 12:00. Daughter aware and will have her here.

## 2013-08-20 ENCOUNTER — Ambulatory Visit: Payer: Medicare Other | Admitting: Family Medicine

## 2013-08-21 ENCOUNTER — Ambulatory Visit (INDEPENDENT_AMBULATORY_CARE_PROVIDER_SITE_OTHER): Payer: Medicare Other | Admitting: Family Medicine

## 2013-08-21 ENCOUNTER — Encounter: Payer: Self-pay | Admitting: Family Medicine

## 2013-08-21 VITALS — BP 128/78 | Temp 97.9°F | Wt <= 1120 oz

## 2013-08-21 DIAGNOSIS — F17200 Nicotine dependence, unspecified, uncomplicated: Secondary | ICD-10-CM

## 2013-08-21 DIAGNOSIS — I2510 Atherosclerotic heart disease of native coronary artery without angina pectoris: Secondary | ICD-10-CM

## 2013-08-21 DIAGNOSIS — Z720 Tobacco use: Secondary | ICD-10-CM

## 2013-08-21 DIAGNOSIS — I10 Essential (primary) hypertension: Secondary | ICD-10-CM

## 2013-08-21 MED ORDER — HYDROCHLOROTHIAZIDE 12.5 MG PO TABS: ORAL_TABLET | ORAL | Status: DC

## 2013-08-21 NOTE — Progress Notes (Signed)
  Subjective:    Patient ID: [name], female    DOB: Oct 05, 1925, 79 y.o.   MRN: [id number]  HPI [name] is a 79 year old widowed female who lives by herself who comes in today for evaluation of multiple issues  Her weight is stable at 66 pounds  She continues to smoke 10 cigarettes daily. We discussed quitting she would like to taper  She was recently in the emergency room evaluation was negative except her BMP was 944. She's complaining about lower extremity edema. She was told to take Lasix one daily but she didn't has not been taking it.   Review of Systems Review of systems otherwise negative very    Objective:   Physical Exam Very thin elderly female no acute distress lung exam shows decreased symmetrical breath sounds from chronic tobacco abuse no crackles cardiac exam shows no murmur one plus peripheral edema       Assessment & Plan:  Tobacco abuse begin tapering program as outlined followup in 4 weeks  I think she has an element of congestive heart failure however I don't want to just treat the number. I think Lasix would be too strong the first and she'll he weighs 99 pounds. We'll start her on a low-dose hydrochlorothiazide 12.5 mg Monday Wednesday Friday

## 2013-08-21 NOTE — Patient Instructions (Signed)
Hydrochlorothiazide 12.5 mg.........Marland Kitchen 1 tablet Monday Wednesday Friday  Taper as outlined           6 for 1 week, 4  for 1 week, 2   Return in one month for followup for 1 week, one for one week and then quit

## 2013-08-25 ENCOUNTER — Ambulatory Visit: Payer: Medicare Other | Admitting: Family Medicine

## 2013-09-17 ENCOUNTER — Encounter: Payer: Self-pay | Admitting: *Deleted

## 2013-09-18 ENCOUNTER — Ambulatory Visit: Payer: Medicare Other | Admitting: Family Medicine

## 2013-09-30 ENCOUNTER — Other Ambulatory Visit: Payer: Self-pay | Admitting: Family Medicine

## 2013-10-02 ENCOUNTER — Telehealth: Payer: Self-pay | Admitting: Family Medicine

## 2013-10-02 ENCOUNTER — Ambulatory Visit: Payer: Medicare Other | Admitting: Family Medicine

## 2013-10-02 NOTE — Telephone Encounter (Signed)
Left message on machine To call back wth more info

## 2013-10-02 NOTE — Telephone Encounter (Signed)
If pt is not feeling well enough to come in, Does she need to go to ed?

## 2013-10-02 NOTE — Telephone Encounter (Signed)
Pt's daughter cancelled appt today b/c pt is not feeling well enough to come in. Daughter states pt is 65 lbs . Daughter would like to know if there is something covered under her insurance plan to where they could have home health come in for an assessment. pls advise.

## 2013-10-02 NOTE — Telephone Encounter (Signed)
She needs to go to the emergency room 

## 2013-10-15 ENCOUNTER — Ambulatory Visit (INDEPENDENT_AMBULATORY_CARE_PROVIDER_SITE_OTHER): Payer: Medicare Other | Admitting: Family

## 2013-10-15 ENCOUNTER — Encounter: Payer: Self-pay | Admitting: Family

## 2013-10-15 VITALS — BP 124/76 | HR 78 | Wt <= 1120 oz

## 2013-10-15 DIAGNOSIS — H8110 Benign paroxysmal vertigo, unspecified ear: Secondary | ICD-10-CM

## 2013-10-15 DIAGNOSIS — R634 Abnormal weight loss: Secondary | ICD-10-CM

## 2013-10-15 MED ORDER — MIRTAZAPINE 7.5 MG PO TABS: 7.5000 mg | ORAL_TABLET | Freq: Every day | ORAL | Status: DC

## 2013-10-15 MED ORDER — MECLIZINE HCL 50 MG PO TABS: 25.0000 mg | ORAL_TABLET | Freq: Three times a day (TID) | ORAL | Status: AC | PRN

## 2013-10-15 NOTE — Patient Instructions (Addendum)
1. We will initiate Remeron for concerns of weight loss. Medication is taken once a day at night.    Vertigo Vertigo means you feel like you or your surroundings are moving when they are not. Vertigo can be dangerous if it occurs when you are at work, driving, or performing difficult activities.  CAUSES  Vertigo occurs when there is a conflict of signals sent to your brain from the visual and sensory systems in your body. There are many different causes of vertigo, including:  Infections, especially in the inner ear.  A bad reaction to a drug or misuse of alcohol and medicines.  Withdrawal from drugs or alcohol.  Rapidly changing positions, such as lying down or rolling over in bed.  A migraine headache.  Decreased blood flow to the brain.  Increased pressure in the brain from a head injury, infection, tumor, or bleeding. SYMPTOMS  You may feel as though the world is spinning around or you are falling to the ground. Because your balance is upset, vertigo can cause nausea and vomiting. You may have involuntary eye movements (nystagmus). DIAGNOSIS  Vertigo is usually diagnosed by physical exam. If the cause of your vertigo is unknown, your caregiver may perform imaging tests, such as an MRI scan (magnetic resonance imaging). TREATMENT  Most cases of vertigo resolve on their own, without treatment. Depending on the cause, your caregiver may prescribe certain medicines. If your vertigo is related to body position issues, your caregiver may recommend movements or procedures to correct the problem. In rare cases, if your vertigo is caused by certain inner ear problems, you may need surgery. HOME CARE INSTRUCTIONS   Follow your caregiver's instructions.  Avoid driving.  Avoid operating heavy machinery.  Avoid performing any tasks that would be dangerous to you or others during a vertigo episode.  Tell your caregiver if you notice that certain medicines seem to be causing your vertigo.  Some of the medicines used to treat vertigo episodes can actually make them worse in some people. SEEK IMMEDIATE MEDICAL CARE IF:   Your medicines do not relieve your vertigo or are making it worse.  You develop problems with talking, walking, weakness, or using your arms, hands, or legs.  You develop severe headaches.  Your nausea or vomiting continues or gets worse.  You develop visual changes.  A family member notices behavioral changes.  Your condition gets worse. MAKE SURE YOU:  Understand these instructions.  Will watch your condition.  Will get help right away if you are not doing well or get worse. Document Released: 07/26/2005 Document Revised: 01/08/2012 Document Reviewed: 05/04/2011 [facility] Patient Information 2014 [place], [place].

## 2013-10-15 NOTE — Progress Notes (Signed)
Subjective:    Patient ID: [name], female    DOB: 01/15/1925, 79 y.o.   MRN: [id number]  HPI 79 year old white female, smoker, patient of Dr. Tawanna Cooler in today with complaints of dizziness off and on over the last 7-8 months. She has a known history of benign positional vertigo.  And vertigo was worse with standing and better with lying down. She denies any associated sneezing, cough or congestion, no chest pain, palpitations, or shortness of breath.  Patient has concerns of decreasing weight over the last year. She has lost approximately 12 pounds over the last one year, decrease her weight to 66 pounds today. Her daughter reports a decreased appetite. Patient reports she's 3 meals a day. Patient also reports that she was encouraged to drink boost in the past but hasn't been able to keep it stocked at home. She denies any blood in her stools are dark black stools. She is a chronic smoker and last chest x-ray 2013.    Review of Systems  Constitutional: Positive for unexpected weight change. Negative for chills.  HENT: Negative.   Respiratory: Negative.   Cardiovascular: Negative.   Gastrointestinal: Negative.   Endocrine: Negative.   Musculoskeletal: Negative.   Skin: Negative.   Allergic/Immunologic: Negative.   Neurological: Positive for dizziness.  Hematological: Negative.   Psychiatric/Behavioral: Negative.    Past Medical History  Diagnosis Date  . CAD (coronary artery disease)     prior stent to the LAD and RCA;  Last [facility] 9/05:  ostial LAD 25%, mid 25%, ostial diagonal 30%, proximal circumflex 25%, ostial OM1 25%, mid RCA 99% in-stent restenosis, then 50 and 40%.  PCI: Taxus DES to the mid RCA.  Last Myoview 5/12: EF 69%, inferior scar with minimal peri-infarct ischemia.  Last echo 2/03: EF 45-55%.  . Depression   . DM (diabetes mellitus)   . Hyperlipidemia   . Colon polyp   . Hyperplastic rectal polyp   . Ovarian cancer   . Cataract   . Bowel obstruction   . H/O:  hysterectomy   . HTN (hypertension)   . Carotid stenosis     s/p right CEA 2000;  Dopplers 10/12: RICA 40-59%, right CEA okay, LICA 100%-followup one year    History   Social History  . Marital Status: Widowed    Spouse Name: N/A    Number of Children: N/A  . Years of Education: N/A   Occupational History  . Not on file.   Social History Main Topics  . Smoking status: Current Every Day Smoker -- 0.50 packs/day    Last Attempt to Quit: 01/01/2012  . Smokeless tobacco: Not on file  . Alcohol Use: No  . Drug Use: No  . Sexual Activity: Not on file   Other Topics Concern  . Not on file   Social History Narrative  . No narrative on file    Past Surgical History  Procedure Laterality Date  . Angioplasty    . Abdominal hysterectomy      Family History  Problem Relation Age of Onset  . Cancer Other     colon    No Known Allergies  Current Outpatient Prescriptions on File Prior to Visit  Medication Sig Dispense Refill  . aspirin 81 MG tablet Take 81 mg by mouth daily.        . Calcium Carbonate-Vitamin D (CALCIUM-VITAMIN D) 600-200 MG-UNIT CAPS Take 1 tablet by mouth every evening.       . fish oil-omega-3 fatty  acids 1000 MG capsule Take 1 g by mouth daily. 1 tab po qd      . furosemide (LASIX) 20 MG tablet Take 1 tablet (20 mg total) by mouth daily.  60 tablet  2  . hydrochlorothiazide (HYDRODIURIL) 12.5 MG tablet 1 tablet Monday Wednesday Friday  90 tablet  3  . metoprolol (LOPRESSOR) 100 MG tablet Take 50 mg by mouth 2 (two) times daily.      . Multiple Vitamin (MULTIVITAMIN) tablet Take 1 tablet by mouth daily.        Marland Kitchen NIFEdipine (PROCARDIA XL/ADALAT-CC) 90 MG 24 hr tablet Take 90 mg by mouth daily.      . nitroGLYCERIN (NITROSTAT) 0.4 MG SL tablet Place 0.4 mg under the tongue every 5 (five) minutes as needed for chest pain.      . ramipril (ALTACE) 10 MG capsule Take 10 mg by mouth at bedtime.      Marland Kitchen zolpidem (AMBIEN) 5 MG tablet TAKE 1 TABLET BY MOUTH AT  BEDTIME AS NEEDED FOR SLEEP  30 tablet  3   No current facility-administered medications on file prior to visit.    BP 124/76  Pulse 78  Wt 66 lb (29.937 kg)chart    Objective:   Physical Exam  Constitutional: She is oriented to person, place, and time.  Frail and thin  HENT:  Right Ear: External ear normal.  Left Ear: External ear normal.  Nose: Nose normal.  Mouth/Throat: Oropharynx is clear and moist.  Neck: Normal range of motion. Neck supple.  Cardiovascular: Normal rate, regular rhythm and normal heart sounds.   Pulmonary/Chest: Effort normal and breath sounds normal.  Abdominal: Soft. Bowel sounds are normal.  Musculoskeletal: Normal range of motion.  Neurological: She is alert and oriented to person, place, and time.  Skin: Skin is warm and dry.  Psychiatric: She has a normal mood and affect.          Assessment & Plan:  assessment: 1. Vertigo 2. Weight loss  Plan: Antivert as needed for dizziness. Encouraged boost twice daily to help increase her weight. Also advised to low dose antidepressant Remeron to help increase her appetite. Recheck in one month with Dr. Tawanna Cooler and sooner as needed.

## 2013-11-04 ENCOUNTER — Ambulatory Visit: Payer: Medicare Other | Admitting: Family Medicine

## 2013-11-12 ENCOUNTER — Ambulatory Visit: Payer: Medicare Other | Admitting: Family

## 2013-11-12 ENCOUNTER — Telehealth: Payer: Self-pay | Admitting: Family Medicine

## 2013-11-12 ENCOUNTER — Other Ambulatory Visit: Payer: Self-pay | Admitting: Family

## 2013-11-12 NOTE — Telephone Encounter (Signed)
Pt's daughter calling on behalf of patient to report concerns related to patient's weight loss.  Daughter states pt is down to 69 lbs and does not eat as she should and she stays in the bed basically all day.  Daughter also concerned that pt is refusing to come in for office visits.  She needs to know what, if anything she can do to assist mother as her condition is deteriorating.  Apppt that was scheduled for today has been cancelled per daughters request since pt refuses to come in.

## 2013-11-13 ENCOUNTER — Encounter (HOSPITAL_COMMUNITY)
Admission: EM | Disposition: A | Discharge status: Home Health | Payer: Self-pay | Source: Home / Self Care | Attending: Cardiology

## 2013-11-13 ENCOUNTER — Inpatient Hospital Stay (HOSPITAL_COMMUNITY)
Admission: EM | Admit: 2013-11-13 | Discharge: 2013-11-16 | DRG: 243 | Disposition: A | Discharge status: Home Health | Payer: Medicare Other | Attending: Cardiovascular Disease | Admitting: Cardiovascular Disease

## 2013-11-13 ENCOUNTER — Other Ambulatory Visit: Payer: Self-pay

## 2013-11-13 ENCOUNTER — Encounter (HOSPITAL_COMMUNITY): Payer: Self-pay | Admitting: Emergency Medicine

## 2013-11-13 DIAGNOSIS — B369 Superficial mycosis, unspecified: Secondary | ICD-10-CM

## 2013-11-13 DIAGNOSIS — Z720 Tobacco use: Secondary | ICD-10-CM | POA: Diagnosis present

## 2013-11-13 DIAGNOSIS — Z800 Family history of malignant neoplasm of digestive organs: Secondary | ICD-10-CM

## 2013-11-13 DIAGNOSIS — F22 Delusional disorders: Secondary | ICD-10-CM | POA: Diagnosis not present

## 2013-11-13 DIAGNOSIS — C569 Malignant neoplasm of unspecified ovary: Secondary | ICD-10-CM

## 2013-11-13 DIAGNOSIS — F0390 Unspecified dementia without behavioral disturbance: Secondary | ICD-10-CM | POA: Diagnosis present

## 2013-11-13 DIAGNOSIS — I6529 Occlusion and stenosis of unspecified carotid artery: Secondary | ICD-10-CM | POA: Diagnosis present

## 2013-11-13 DIAGNOSIS — R634 Abnormal weight loss: Secondary | ICD-10-CM | POA: Diagnosis present

## 2013-11-13 DIAGNOSIS — I248 Other forms of acute ischemic heart disease: Secondary | ICD-10-CM | POA: Diagnosis present

## 2013-11-13 DIAGNOSIS — N179 Acute kidney failure, unspecified: Secondary | ICD-10-CM | POA: Diagnosis present

## 2013-11-13 DIAGNOSIS — I498 Other specified cardiac arrhythmias: Secondary | ICD-10-CM

## 2013-11-13 DIAGNOSIS — F17200 Nicotine dependence, unspecified, uncomplicated: Secondary | ICD-10-CM | POA: Diagnosis present

## 2013-11-13 DIAGNOSIS — F329 Major depressive disorder, single episode, unspecified: Secondary | ICD-10-CM | POA: Diagnosis present

## 2013-11-13 DIAGNOSIS — R630 Anorexia: Secondary | ICD-10-CM | POA: Diagnosis present

## 2013-11-13 DIAGNOSIS — I739 Peripheral vascular disease, unspecified: Secondary | ICD-10-CM | POA: Diagnosis present

## 2013-11-13 DIAGNOSIS — Z9861 Coronary angioplasty status: Secondary | ICD-10-CM

## 2013-11-13 DIAGNOSIS — Z9181 History of falling: Secondary | ICD-10-CM

## 2013-11-13 DIAGNOSIS — R110 Nausea: Secondary | ICD-10-CM | POA: Diagnosis present

## 2013-11-13 DIAGNOSIS — E119 Type 2 diabetes mellitus without complications: Secondary | ICD-10-CM | POA: Diagnosis present

## 2013-11-13 DIAGNOSIS — I2489 Other forms of acute ischemic heart disease: Secondary | ICD-10-CM | POA: Diagnosis present

## 2013-11-13 DIAGNOSIS — F3289 Other specified depressive episodes: Secondary | ICD-10-CM | POA: Diagnosis present

## 2013-11-13 DIAGNOSIS — I441 Atrioventricular block, second degree: Secondary | ICD-10-CM | POA: Diagnosis present

## 2013-11-13 DIAGNOSIS — Z860100 Personal history of colon polyps, unspecified: Secondary | ICD-10-CM

## 2013-11-13 DIAGNOSIS — R627 Adult failure to thrive: Secondary | ICD-10-CM | POA: Diagnosis present

## 2013-11-13 DIAGNOSIS — Z7982 Long term (current) use of aspirin: Secondary | ICD-10-CM

## 2013-11-13 DIAGNOSIS — R001 Bradycardia, unspecified: Secondary | ICD-10-CM

## 2013-11-13 DIAGNOSIS — E785 Hyperlipidemia, unspecified: Secondary | ICD-10-CM | POA: Diagnosis present

## 2013-11-13 DIAGNOSIS — Z8543 Personal history of malignant neoplasm of ovary: Secondary | ICD-10-CM

## 2013-11-13 DIAGNOSIS — E875 Hyperkalemia: Secondary | ICD-10-CM | POA: Diagnosis present

## 2013-11-13 DIAGNOSIS — I2510 Atherosclerotic heart disease of native coronary artery without angina pectoris: Secondary | ICD-10-CM | POA: Diagnosis present

## 2013-11-13 DIAGNOSIS — H8110 Benign paroxysmal vertigo, unspecified ear: Secondary | ICD-10-CM

## 2013-11-13 DIAGNOSIS — F29 Unspecified psychosis not due to a substance or known physiological condition: Secondary | ICD-10-CM | POA: Diagnosis present

## 2013-11-13 DIAGNOSIS — L989 Disorder of the skin and subcutaneous tissue, unspecified: Secondary | ICD-10-CM

## 2013-11-13 DIAGNOSIS — I4510 Unspecified right bundle-branch block: Secondary | ICD-10-CM | POA: Diagnosis present

## 2013-11-13 DIAGNOSIS — I10 Essential (primary) hypertension: Secondary | ICD-10-CM | POA: Diagnosis present

## 2013-11-13 DIAGNOSIS — R7989 Other specified abnormal findings of blood chemistry: Secondary | ICD-10-CM | POA: Diagnosis present

## 2013-11-13 DIAGNOSIS — Z79899 Other long term (current) drug therapy: Secondary | ICD-10-CM

## 2013-11-13 DIAGNOSIS — I359 Nonrheumatic aortic valve disorder, unspecified: Secondary | ICD-10-CM

## 2013-11-13 DIAGNOSIS — R778 Other specified abnormalities of plasma proteins: Secondary | ICD-10-CM

## 2013-11-13 DIAGNOSIS — I4430 Unspecified atrioventricular block: Secondary | ICD-10-CM | POA: Diagnosis present

## 2013-11-13 DIAGNOSIS — I442 Atrioventricular block, complete: Principal | ICD-10-CM | POA: Diagnosis present

## 2013-11-13 HISTORY — PX: TEMPORARY PACEMAKER INSERTION: SHX5471

## 2013-11-13 LAB — COMPREHENSIVE METABOLIC PANEL
ALT: 38 U/L — AB (ref 0–35)
AST: 34 U/L (ref 0–37)
Albumin: 3.3 g/dL — ABNORMAL LOW (ref 3.5–5.2)
Alkaline Phosphatase: 108 U/L (ref 39–117)
BUN: 67 mg/dL — AB (ref 6–23)
CALCIUM: 9.2 mg/dL (ref 8.4–10.5)
CO2: 24 meq/L (ref 19–32)
CREATININE: 1.48 mg/dL — AB (ref 0.50–1.10)
Chloride: 96 mEq/L (ref 96–112)
GFR, EST AFRICAN AMERICAN: 36 mL/min — AB (ref >90)
GFR, EST NON AFRICAN AMERICAN: 31 mL/min — AB (ref >90)
GLUCOSE: 155 mg/dL — AB (ref 70–99)
Potassium: 6.4 mEq/L — ABNORMAL HIGH (ref 3.7–5.3)
Sodium: 140 mEq/L (ref 137–147)
Total Bilirubin: 0.3 mg/dL (ref 0.3–1.2)
Total Protein: 7 g/dL (ref 6.0–8.3)

## 2013-11-13 LAB — CBC
HCT: 43 % (ref 36.0–46.0)
HEMOGLOBIN: 14.2 g/dL (ref 12.0–15.0)
MCH: 29.7 pg (ref 26.0–34.0)
MCHC: 33 g/dL (ref 30.0–36.0)
MCV: 90 fL (ref 78.0–100.0)
Platelets: 229 10*3/uL (ref 150–400)
RBC: 4.78 MIL/uL (ref 3.87–5.11)
RDW: 15.9 % — ABNORMAL HIGH (ref 11.5–15.5)
WBC: 13.1 10*3/uL — ABNORMAL HIGH (ref 4.0–10.5)

## 2013-11-13 LAB — POCT I-STAT TROPONIN I: Troponin i, poc: 0.22 ng/mL (ref 0.00–0.08)

## 2013-11-13 LAB — CBC WITH DIFFERENTIAL/PLATELET
Basophils Absolute: 0 10*3/uL (ref 0.0–0.1)
Basophils Relative: 0 % (ref 0–1)
EOS PCT: 0 % (ref 0–5)
Eosinophils Absolute: 0 10*3/uL (ref 0.0–0.7)
HEMATOCRIT: 47.2 % — AB (ref 36.0–46.0)
Hemoglobin: 15.7 g/dL — ABNORMAL HIGH (ref 12.0–15.0)
LYMPHS ABS: 1.3 10*3/uL (ref 0.7–4.0)
LYMPHS PCT: 11 % — AB (ref 12–46)
MCH: 30.1 pg (ref 26.0–34.0)
MCHC: 33.3 g/dL (ref 30.0–36.0)
MCV: 90.6 fL (ref 78.0–100.0)
MONO ABS: 0.5 10*3/uL (ref 0.1–1.0)
MONOS PCT: 4 % (ref 3–12)
Neutro Abs: 9.9 10*3/uL — ABNORMAL HIGH (ref 1.7–7.7)
Neutrophils Relative %: 84 % — ABNORMAL HIGH (ref 43–77)
Platelets: 228 10*3/uL (ref 150–400)
RBC: 5.21 MIL/uL — AB (ref 3.87–5.11)
RDW: 15.7 % — ABNORMAL HIGH (ref 11.5–15.5)
WBC: 11.8 10*3/uL — AB (ref 4.0–10.5)

## 2013-11-13 LAB — POCT I-STAT, CHEM 8
BUN: 63 mg/dL — ABNORMAL HIGH (ref 6–23)
CREATININE: 1.7 mg/dL — AB (ref 0.50–1.10)
Calcium, Ion: 1.08 mmol/L — ABNORMAL LOW (ref 1.13–1.30)
Chloride: 102 mEq/L (ref 96–112)
Glucose, Bld: 161 mg/dL — ABNORMAL HIGH (ref 70–99)
HCT: 47 % — ABNORMAL HIGH (ref 36.0–46.0)
HEMOGLOBIN: 16 g/dL — AB (ref 12.0–15.0)
Potassium: 5.6 mEq/L — ABNORMAL HIGH (ref 3.7–5.3)
SODIUM: 138 meq/L (ref 137–147)
TCO2: 29 mmol/L (ref 0–100)

## 2013-11-13 LAB — TROPONIN I
Troponin I: 0.31 ng/mL (ref <0.30)
Troponin I: 0.64 ng/mL (ref <0.30)

## 2013-11-13 LAB — GLUCOSE, CAPILLARY
Glucose-Capillary: 117 mg/dL — ABNORMAL HIGH (ref 70–99)
Glucose-Capillary: 119 mg/dL — ABNORMAL HIGH (ref 70–99)

## 2013-11-13 LAB — TSH: TSH: 5.271 u[IU]/mL — AB (ref 0.350–4.500)

## 2013-11-13 LAB — CREATININE, SERUM
Creatinine, Ser: 1.41 mg/dL — ABNORMAL HIGH (ref 0.50–1.10)
GFR calc Af Amer: 38 mL/min — ABNORMAL LOW (ref >90)
GFR, EST NON AFRICAN AMERICAN: 33 mL/min — AB (ref >90)

## 2013-11-13 LAB — MRSA PCR SCREENING: MRSA by PCR: NEGATIVE

## 2013-11-13 SURGERY — TEMPORARY PACEMAKER INSERTION
Anesthesia: LOCAL

## 2013-11-13 MED ORDER — SODIUM CHLORIDE 0.9 % IJ SOLN: 3.0000 mL | INTRAMUSCULAR | Status: DC | PRN

## 2013-11-13 MED ORDER — CALCIUM GLUCONATE 10 % IV SOLN: 1.0000 g | Freq: Once | INTRAVENOUS | Status: DC

## 2013-11-13 MED ORDER — SODIUM CHLORIDE 0.9 % IJ SOLN: 10.0000 mL | INTRAMUSCULAR | Status: DC | PRN

## 2013-11-13 MED ORDER — HEPARIN (PORCINE) IN NACL 2-0.9 UNIT/ML-% IJ SOLN
INTRAMUSCULAR | Status: AC
  Filled 2013-11-13: qty 1000

## 2013-11-13 MED ORDER — SODIUM CHLORIDE 0.9 % IJ SOLN
3.0000 mL | Freq: Two times a day (BID) | INTRAMUSCULAR | Status: DC
Start: 2013-11-14 — End: 2013-11-14
  Administered 2013-11-13: 3 mL via INTRAVENOUS

## 2013-11-13 MED ORDER — SODIUM CHLORIDE 0.9 % IV SOLN: 250.0000 mL | INTRAVENOUS | Status: DC | PRN

## 2013-11-13 MED ORDER — LIDOCAINE HCL (PF) 1 % IJ SOLN
INTRAMUSCULAR | Status: AC
  Filled 2013-11-13: qty 30

## 2013-11-13 MED ORDER — FENTANYL CITRATE 0.05 MG/ML IJ SOLN
INTRAMUSCULAR | Status: AC
  Filled 2013-11-13: qty 2

## 2013-11-13 MED ORDER — SODIUM CHLORIDE 0.9 % IJ SOLN
10.0000 mL | Freq: Two times a day (BID) | INTRAMUSCULAR | Status: DC
  Administered 2013-11-13: 10 mL

## 2013-11-13 MED ORDER — ASPIRIN 81 MG PO CHEW
324.0000 mg | CHEWABLE_TABLET | ORAL | Status: AC
  Administered 2013-11-13: 324 mg via ORAL

## 2013-11-13 MED ORDER — ONDANSETRON HCL 4 MG/2ML IJ SOLN
INTRAMUSCULAR | Status: AC
  Administered 2013-11-13: 4 mg
  Filled 2013-11-13: qty 2

## 2013-11-13 MED ORDER — ASPIRIN EC 81 MG PO TBEC
81.0000 mg | DELAYED_RELEASE_TABLET | Freq: Every day | ORAL | Status: DC
  Administered 2013-11-14 – 2013-11-16 (×3): 81 mg via ORAL
  Filled 2013-11-13 (×3): qty 1

## 2013-11-13 MED ORDER — SODIUM CHLORIDE 0.9 % IV SOLN
INTRAVENOUS | Status: DC
  Administered 2013-11-13: 14:00:00 via INTRAVENOUS

## 2013-11-13 MED ORDER — HYDRALAZINE HCL 20 MG/ML IJ SOLN
INTRAMUSCULAR | Status: AC
  Filled 2013-11-13: qty 1

## 2013-11-13 MED ORDER — ASPIRIN 300 MG RE SUPP
300.0000 mg | RECTAL | Status: AC
  Filled 2013-11-13: qty 1

## 2013-11-13 MED ORDER — ATROPINE SULFATE 0.1 MG/ML IJ SOLN
INTRAMUSCULAR | Status: AC
  Administered 2013-11-13: 0.5 mg
  Filled 2013-11-13: qty 10

## 2013-11-13 MED ORDER — ONDANSETRON HCL 4 MG/2ML IJ SOLN
4.0000 mg | Freq: Four times a day (QID) | INTRAMUSCULAR | Status: DC | PRN
Start: 2013-11-13 — End: 2013-11-14
  Administered 2013-11-14: 4 mg via INTRAVENOUS
  Filled 2013-11-13: qty 2

## 2013-11-13 MED ORDER — NITROGLYCERIN 0.2 MG/ML ON CALL CATH LAB
INTRAVENOUS | Status: AC
  Filled 2013-11-13: qty 1

## 2013-11-13 MED ORDER — ACETAMINOPHEN 325 MG PO TABS: 650.0000 mg | ORAL_TABLET | ORAL | Status: DC | PRN

## 2013-11-13 MED ORDER — SODIUM CHLORIDE 0.9 % IV BOLUS (SEPSIS)
500.0000 mL | Freq: Once | INTRAVENOUS | Status: AC
  Administered 2013-11-13: 500 mL via INTRAVENOUS

## 2013-11-13 MED ORDER — SODIUM CHLORIDE 0.9 % IV SOLN
INTRAVENOUS | Status: DC
  Administered 2013-11-13: 10 mL/h via INTRAVENOUS
  Administered 2013-11-13: 17:00:00 via INTRAVENOUS

## 2013-11-13 MED ORDER — ATROPINE SULFATE 0.1 MG/ML IJ SOLN
0.5000 mg | Freq: Once | INTRAMUSCULAR | Status: DC
Start: 2013-11-13 — End: 2013-11-13

## 2013-11-13 MED ORDER — MIDAZOLAM HCL 2 MG/2ML IJ SOLN
INTRAMUSCULAR | Status: AC
  Filled 2013-11-13: qty 2

## 2013-11-13 MED ORDER — HEPARIN SODIUM (PORCINE) 5000 UNIT/ML IJ SOLN
5000.0000 [IU] | Freq: Three times a day (TID) | INTRAMUSCULAR | Status: DC
  Administered 2013-11-13 – 2013-11-14 (×2): 5000 [IU] via SUBCUTANEOUS
  Filled 2013-11-13 (×6): qty 1

## 2013-11-13 NOTE — Consult Note (Signed)
ELECTROPHYSIOLOGY CONSULT NOTE  Patient ID: [name] MRN: [id number], DOB/AGE: [id number]   Admit date: 11/13/2013 Date of Consult: 11/13/2013  Primary Physician: Joycelyn Man, MD Primary Cardiologist: Percival Spanish, MD Reason for Consultation: Bradycardia  History of Present Illness [name] is a 80 y.o. female with CAD s/p PCI to LAD and RCA 2005, preserved LV function by echo Feb 2013, PVD, DM and ovarian CA who presents to the ED with SOB and AMS. She lives alone. Her daughter is at the bedside and assists with history. Apparently [name] has been steadily declining physically over several months. Her daughter tells me she has had 60-lb weight loss and loss of appetite. She has complained of intermittent SOB and dizziness for about 4 months or so. She has fallen twice in the last week but denies syncope. She denies CP. On EMS arrival, she was bradycardic with rate in the 20s. ECGs and rhythm strips reviewed which reveal high grade AV block at 28 bpm. She takes metoprolol for PACs/PVCs and rare SVT per Dr. Rosezella Florida most recent note from March 2013. Currently [name] complains of dizziness.   Past Medical History Past Medical History  Diagnosis Date  . CAD (coronary artery disease)     prior stent to the LAD and RCA;  Last [facility]:  ostial LAD 25%, mid 25%, ostial diagonal 30%, proximal circumflex 25%, ostial OM1 25%, mid RCA 99% in-stent restenosis, then 50 and 40%.  PCI: Taxus DES to the mid RCA.  Last Myoview 5/12: EF 69%, inferior scar with minimal peri-infarct ischemia.  Last echo 2/03: EF 45-55%.  . Depression   . DM (diabetes mellitus)   . Hyperlipidemia   . Colon polyp   . Hyperplastic rectal polyp   . Ovarian cancer   . Cataract   . Bowel obstruction   . H/O: hysterectomy   . HTN (hypertension)   . Carotid stenosis     s/p right CEA 2000;  Dopplers 10/12: RICA 40-59%, right CEA okay, LICA 130%-QMVHQION one year    Past Surgical History Past  Surgical History  Procedure Laterality Date  . Angioplasty    . Abdominal hysterectomy      Allergies/Intolerances No Known Allergies  Current Home Medications      aspirin 81 MG tablet  Take 81 mg by mouth daily.     Calcium-Vitamin D 600-200 MG-UNIT Caps  Take 1 tablet by mouth every evening.     fish oil-omega-3 fatty acids 1000 MG capsule  Take 1 g by mouth daily. 1 tab po qd     furosemide 20 MG tablet  Commonly known as:  LASIX  Take 1 tablet (20 mg total) by mouth daily.     hydrochlorothiazide 12.5 MG tablet  Commonly known as:  HYDRODIURIL  1 tablet Monday Wednesday Friday     meclizine 50 MG tablet  Commonly known as:  ANTIVERT  Take 0.5 tablets (25 mg total) by mouth 3 (three) times daily as needed.     metoprolol 100 MG tablet  Commonly known as:  LOPRESSOR  Take 50 mg by mouth 2 (two) times daily.     mirtazapine 7.5 MG tablet  Commonly known as:  REMERON  TAKE 1 TABLET BY MOUTH AT BEDTIME     multivitamin tablet  Take 1 tablet by mouth daily.     NIFEdipine 90 MG 24 hr tablet  Commonly known as:  PROCARDIA XL/ADALAT-CC  Take 90 mg by mouth daily.  nitroGLYCERIN 0.4 MG SL tablet  Commonly known as:  NITROSTAT  Place 0.4 mg under the tongue every 5 (five) minutes as needed for chest pain.     ramipril 10 MG capsule  Commonly known as:  ALTACE  Take 10 mg by mouth at bedtime.     zolpidem 5 MG tablet  Commonly known as:  AMBIEN  TAKE 1 TABLET BY MOUTH AT BEDTIME AS NEEDED FOR SLEEP     Inpatient Medications . [facility] [address]] calcium gluconate  1 g Intravenous Once    Family History Family History  Problem Relation Age of Onset  . Cancer Other     colon     Social History History   Social History  . Marital Status: Widowed    Spouse Name: N/A    Number of Children: N/A  . Years of Education: N/A   Occupational History  . Not on file.   Social History Main Topics  . Smoking status: Current Every Day Smoker -- 0.50 packs/day     Last Attempt to Quit: 01/01/2012  . Smokeless tobacco: Not on file  . Alcohol Use: No  . Drug Use: No  . Sexual Activity: Not on file   Other Topics Concern  . Not on file   Social History Narrative  . No narrative on file     Review of Systems General: No chills, fever, night sweats or weight changes  Cardiovascular:  No chest pain, dyspnea on exertion, edema, orthopnea, palpitations, paroxysmal nocturnal dyspnea Dermatological: No rash, lesions or masses Respiratory: No cough, dyspnea Urologic: No hematuria, dysuria Abdominal: No nausea, vomiting, diarrhea, bright red blood per rectum, melena, or hematemesis Neurologic: No visual changes, weakness, changes in mental status All other systems reviewed and are otherwise negative except as noted above.  Physical Exam Vitals: SBP 80, pulse 28, resp 14, temp 97.0, O2 sat 93% General: Thin, frail appearing 80 y.o. female in no acute distress. HEENT: Normocephalic, atraumatic. EOMs intact. Sclera nonicteric. Oropharynx clear.  Neck: Supple. No JVD. Lungs: Respirations regular and unlabored, CTA bilaterally. No wheezes, rales or rhonchi. Heart: Profoundly bradycardic. S1, S2 present. I do not appreciate any murmur.  Abdomen: Soft, non-tender, non-distended. BS present x 4 quadrants. No hepatosplenomegaly.  Extremities: No clubbing, cyanosis or edema.  Psych: Normal affect. Neuro: Alert and oriented X 3. Moves all extremities spontaneously. Skin: Intact. Warm and dry. No rashes or petechiae in exposed areas.   Labs POC troponin 0.22 ProBNP 945  Lab Results  Component Value Date   WBC 11.0* 06/26/2013   HGB 16.0* 11/13/2013   HCT 47.0* 11/13/2013   MCV 89.3 06/26/2013   PLT 268 06/26/2013     Recent Labs Lab 11/13/13 0941  NA 138  K 5.6*  CL 102  BUN 63*  CREATININE 1.70*  GLUCOSE 161*   No results found for this basename: TSH, T4TOTAL, FREET3, T3FREE, THYROIDAB,  in the last 72 hours   Radiology/Studies No results  found.   Echocardiogram Feb 2013  Study Conclusions - Left ventricle: The cavity size was normal. Wall thickness was normal. Systolic function was normal. The estimated ejection fraction was in the range of 55% to 65%. Wall motion was normal; there were no regional wall motion abnormalities. Doppler parameters are consistent with abnormal left ventricular relaxation (grade 1 diastolic dysfunction). - Aortic valve: Mild regurgitation. - Pulmonary arteries: Systolic pressure was mildly to moderately increased. PA peak pressure: 4mm Hg (S).  12-lead ECG and rhythm strips from EMS - high  grade AV block at 28 bpm Telemetry in ED confirms persistent high grade AV block   Assessment and Plan 1. High grade AV block 2. CAD 3. Preserved LV function by echo 2013 4. Failure to thrive  Ms. Pellecchia presents with symptomatic high grade AV block. While she is currently alert and mentating normally, I am concerned she will not remain hemodynamically stable without intervention. Dr. Ron Parker has recommended placement of temporary pacing wire urgently. In addition, she will undergo cardiac cath for definitive evaluation of her coronary anatomy given prior RCA stent. Ms. Hart and her daughter agree to proceed. She is currently taking metoprolol which will be held. If she has persistent AV block despite discontinuation of AV nodal blocking medications, she will need permanent pacemaker implantation. Will update echo. Will check thyroid panel. Discontinue metoprolol. Cycle troponin.  Dr. Lovena Le to see. Please see recommendations below. Signed, Ileene Hutchinson, PA-C 11/13/2013, 10:34 AM  EP Attending  Patient seen and examined. Agree with above history, physical exam, assessment and plan. She has high grade AV block in the setting of beta blockers and known CAD. At this point, withdrawal of her beta blocking drugs and observation and workup and evaluation of coronary status seems most appropriate. Would  have low threshold to pace her as she would ideally be on beta blocker therapy long term.  Mikle Bosworth.D.

## 2013-11-13 NOTE — Interval H&P Note (Signed)
History and Physical Interval Note:  11/13/2013 10:51 AM  [name]  has presented today for surgery, with the diagnosis of High Grade AVB & H/O CAD.   The various methods of treatment have been discussed with the patient and family. After consideration of risks, benefits and other options for treatment, the patient has consented to  Procedure(s): TEMP WIRE  (N/A)  LEFT HEART CATHETERIZATION WITH NATIVE CORONARY ANGIOGRAPHY +/- PCI as a surgical intervention .  The patient's history has been reviewed, patient examined, no change in status, stable for surgery.  I have reviewed the patient's chart and labs.  Questions were answered to the patient's satisfaction.     HARDING,DAVID W.  Cath Lab Visit (complete for each Cath Lab visit)  Clinical Evaluation Leading to the Procedure:   ACS: no  Non-ACS:    Anginal Classification: CCS IV - if High Grade AVB is Ischemia.  Anti-ischemic medical therapy: Maximal Therapy (2 or more classes of medications)  Non-Invasive Test Results: No non-invasive testing performed  Prior CABG: No previous CABG

## 2013-11-13 NOTE — Care Management Note (Signed)
    Page 1 of 1   11/13/2013     1:52:09 PM   CARE MANAGEMENT NOTE 11/13/2013  Patient:  SHARAI, OVERBAY   Account Number:  [id number]  Date Initiated:  11/13/2013  Documentation initiated by:  Elissa Hefty  Subjective/Objective Assessment:   adm w bradycardia     Action/Plan:   lives alone, supp da   Anticipated DC Date:     Anticipated DC Plan:           Choice offered to / List presented to:             Status of service:   Medicare Important Message given?   (If response is "NO", the following Medicare IM given date fields will be blank) Date Medicare IM given:   Date Additional Medicare IM given:    Discharge Disposition:    Per UR Regulation:  Reviewed for med. necessity/level of care/duration of stay  If discussed at [place] of Stay Meetings, dates discussed:    Comments:  1/15 1351 debbie Eliannah Hinde rn,bsn per old records hx of [facility].

## 2013-11-13 NOTE — ED Notes (Signed)
Patient from home sent to ED for bradycardia.  Patient is alert and oriented x 3.  Lives by herself and daughter called EMS for unconscious but when EMS arrived, patient was alert and oriented x 3.  Per [facility], HR was in low 20s.  Patient arrived alert and oriented, HR 40s-60s on monitor.  Patient denies any pain just c/nausea.

## 2013-11-13 NOTE — ED Notes (Signed)
Patient transferred to Cath Lab.  Alert and oriented x 3.  Daughter at the bedside

## 2013-11-13 NOTE — ED Notes (Signed)
Gave calcium chloride (1amp) at 0955 per Dr. Alvino Chapel

## 2013-11-13 NOTE — ED Provider Notes (Signed)
CSN: [id number]     Arrival date & time 11/13/13  9563 History   First MD Initiated Contact with Patient 11/13/13 [phone]     Chief Complaint  Patient presents with  . Bradycardia   (Consider location/radiation/quality/duration/timing/severity/associated sxs/prior Treatment) The history is provided by the patient and the nursing home.   patient is is nursing home after feeling nauseous and somewhat dizzy since last night. No chest pain. Has some dizziness here. Found by EMS to have a heart rate in the 30s. No fevers. No cough. She is not actually vomited, she does not have much of an appetite.  Past Medical History  Diagnosis Date  . CAD (coronary artery disease)     prior stent to the LAD and RCA;  Last [facility] 9/05:  ostial LAD 25%, mid 25%, ostial diagonal 30%, proximal circumflex 25%, ostial OM1 25%, mid RCA 99% in-stent restenosis, then 50 and 40%.  PCI: Taxus DES to the mid RCA.  Last Myoview 5/12: EF 69%, inferior scar with minimal peri-infarct ischemia.  Last echo 2/03: EF 45-55%.  . Depression   . DM (diabetes mellitus)   . Hyperlipidemia   . Colon polyp   . Hyperplastic rectal polyp   . Ovarian cancer   . Cataract   . Bowel obstruction   . H/O: hysterectomy   . HTN (hypertension)   . Carotid stenosis     s/p right CEA 2000;  Dopplers 10/12: RICA 40-59%, right CEA okay, LICA 433%-IRJJOACZ one year   Past Surgical History  Procedure Laterality Date  . Angioplasty    . Abdominal hysterectomy     Family History  Problem Relation Age of Onset  . Cancer Other     colon   History  Substance Use Topics  . Smoking status: Current Every Day Smoker -- 0.50 packs/day    Last Attempt to Quit: 01/01/2012  . Smokeless tobacco: Not on file  . Alcohol Use: No   OB History   Grav Para Term Preterm Abortions TAB SAB Ect Mult Living                 Review of Systems  Constitutional: Negative for activity change and appetite change.  Eyes: Negative for pain.  Respiratory: Negative  for chest tightness and shortness of breath.   Cardiovascular: Negative for chest pain and leg swelling.  Gastrointestinal: Positive for nausea. Negative for vomiting, abdominal pain and diarrhea.  Genitourinary: Negative for flank pain.  Musculoskeletal: Negative for back pain and neck stiffness.  Skin: Negative for rash.  Neurological: Positive for dizziness. Negative for weakness, numbness and headaches.  Psychiatric/Behavioral: Negative for behavioral problems.    Allergies  Review of patient's allergies indicates no known allergies.  Home Medications  No current outpatient prescriptions on file. BP 75/51  Pulse 95  Temp(Src) 97 F (36.1 C) (Oral)  Resp 13  SpO2 93% Physical Exam  Nursing note and vitals reviewed. Constitutional: She is oriented to person, place, and time. She appears well-developed and well-nourished.  HENT:  Head: Normocephalic and atraumatic.  Eyes: EOM are normal. Pupils are equal, round, and reactive to light.  Neck: Normal range of motion. Neck supple.  Cardiovascular: Regular rhythm and normal heart sounds.   No murmur heard. Bradycardia  Pulmonary/Chest: Effort normal and breath sounds normal. No respiratory distress. She has no wheezes. She has no rales.  Abdominal: Soft. Bowel sounds are normal. She exhibits no distension. There is no tenderness. There is no rebound and no guarding.  Musculoskeletal:  Normal range of motion.  Neurological: She is alert and oriented to person, place, and time. No cranial nerve deficit.  Skin: Skin is warm and dry. There is pallor.  Psychiatric: She has a normal mood and affect. Her speech is normal.    ED Course  Procedures (including critical care time) Labs Review Labs Reviewed  POCT I-STAT, CHEM 8 - Abnormal; Notable for the following:    Potassium 5.6 (*)    BUN 63 (*)    Creatinine, Ser 1.70 (*)    Glucose, Bld 161 (*)    Calcium, Ion 1.08 (*)    Hemoglobin 16.0 (*)    HCT 47.0 (*)    All other  components within normal limits  POCT I-STAT TROPONIN I - Abnormal; Notable for the following:    Troponin i, poc 0.22 (*)    All other components within normal limits  CBC WITH DIFFERENTIAL  COMPREHENSIVE METABOLIC PANEL  TSH  URINALYSIS, ROUTINE W REFLEX MICROSCOPIC   Imaging Review No results found.  EKG Interpretation    Date/Time:    Ventricular Rate:    PR Interval:    QRS Duration:   QT Interval:    QTC Calculation:   R Axis:     Text Interpretation:            CRITICAL CARE Performed by: Mackie Pai Total critical care time: 30 Critical care time was exclusive of separately billable procedures and treating other patients. Critical care was necessary to treat or prevent imminent or life-threatening deterioration. Critical care was time spent personally by me on the following activities: development of treatment plan with patient and/or surrogate as well as nursing, discussions with consultants, evaluation of patient's response to treatment, examination of patient, obtaining history from patient or surrogate, ordering and performing treatments and interventions, ordering and review of laboratory studies, ordering and review of radiographic studies, pulse oximetry and re-evaluation of patient's condition.   MDM   1. Bradycardia   2. CAD (coronary artery disease)   3. DM (diabetes mellitus)   4. Hyperlipidemia   5. Carotid stenosis    Patient with bradycardia. EKG shows AV block with 3:1 conduction. The third beat is conducted. It is a different morphology than her normal right bundle branch block, however still rather narrow. EKG is not available at this time, because it is not in the computer and has been taken by cardiology. Patient was treated with atropine without results. Is also given calcium for mild hyperkalemia. Troponin is mildly elevated. Seen in the ED by cardiology. With mildly elevated troponin question was ischemic cause of the bradycardia her  system and ischemia from the bradycardia. She was taken to the Cath Lab for a temporary pacer wire.    Jasper Riling. Alvino Chapel, MD 11/13/13 [phone]

## 2013-11-13 NOTE — H&P (Signed)
CARDIOLOGY HISTORY AND PHYSICAL   Patient ID: SABENA WINNER MRN: [id number]  DOB/AGE: 1925-08-10 80 y.o. Admit date: 11/13/2013  Primary Care Physician: Joycelyn Man, MD Primary Cardiologist:  Hochrein  Clinical Summary Ms. Slingerland is a 80 y.o.female. Her cardiology is followed by Dr. Percival Spanish. History is taken from the patient who does live alone. However she is not getting much history at this time. History is also taken from her daughter. Her daughter did stay with her last night. Her daughter says that she has had very poor appetite and losing weight for at least several days. Last night the patient was confused. She's had nausea intermittently. She was brought to the emergency room today. She has high degree AV block with 3:1 block. Her blood pressure is stable. She is having persistent nausea. Potassium was obtained with a value of 5.6. She received one amp of calcium and there was no change in her heart rhythm. On the initial 12-lead EKG the complex was different from her underlying right bundle branch block. Her first troponin is 0.22. She has not been having significant chest pain. She has a known ejection fraction of 45%. Last echo was 2013. She has known coronary disease. Last catheterization was 2005. Last Myoview scan was 2012. She had inferior scar. There was question of some mild peri-infarct ischemia.  Creatinine in the ER is 1.7. The last recorded value is 0.7. She has acute renal failure.   No Known Allergies  Home Medications  (Not in a hospital admission)  Scheduled Medications . calcium gluconate  1 g Intravenous Once     Infusions     PRN Medications    Past Medical History  Diagnosis Date  . CAD (coronary artery disease)     prior stent to the LAD and RCA;  Last [facility] 9/05:  ostial LAD 25%, mid 25%, ostial diagonal 30%, proximal circumflex 25%, ostial OM1 25%, mid RCA 99% in-stent restenosis, then 50 and 40%.  PCI: Taxus DES to the mid RCA.   Last Myoview 5/12: EF 69%, inferior scar with minimal peri-infarct ischemia.  Last echo 2/03: EF 45-55%.  . Depression   . DM (diabetes mellitus)   . Hyperlipidemia   . Colon polyp   . Hyperplastic rectal polyp   . Ovarian cancer   . Cataract   . Bowel obstruction   . H/O: hysterectomy   . HTN (hypertension)   . Carotid stenosis     s/p right CEA 2000;  Dopplers 10/12: RICA 40-59%, right CEA okay, LICA 124%-PYKDXIPJ one year    Past Surgical History  Procedure Laterality Date  . Angioplasty    . Abdominal hysterectomy      Family History  Problem Relation Age of Onset  . Cancer Other     colon    Social History Ms. Oshields reports that she has been smoking.  She does not have any smokeless tobacco history on file. Ms. Stum reports that she does not drink alcohol.  Review of Systems  Patient denies fever, chills, headache, sweats, rash, change in vision, change in hearing, chest pain, cough, urinary symptoms. All other systems are reviewed and are negative other than the history of present illness.  Physical Examination Temp:  [97 F (36.1 C)] 97 F (36.1 C) (01/15 0927) Pulse Rate:  [62-95] 95 (01/15 0945) Resp:  [12-28] 13 (01/15 1000) BP: (75)/(51) 75/51 mmHg (01/15 0927) SpO2:  [93 %-94 %] 93 % (01/15 0945)  The patient is thin and  frail. She is oriented to person time and place. She is uncomfortable with some nausea. Her exam is limited. Lungs reveal no significant rales. Cardiac exam reveals S1 and S2. Her abdomen is soft. She has no significant peripheral edema.   Lab Results  Basic Metabolic Panel:  Recent Labs Lab 11/13/13 0941  NA 138  K 5.6*  CL 102  GLUCOSE 161*  BUN 63*  CREATININE 1.70*    Liver Function Tests: No results found for this basename: AST, ALT, ALKPHOS, BILITOT, PROT, ALBUMIN,  in the last 168 hours  CBC:  Recent Labs Lab 11/13/13 0941  HGB 16.0*  HCT 47.0*    Cardiac Enzymes: No results found for this basename:  CKTOTAL, CKMB, CKMBINDEX, TROPONINI,  in the last 168 hours  BNP: No components found with this basename: POCBNP,    Radiology No results found.  Prior Cardiac Testing/Procedures:   ECG   EKG reveals 3:1 AV block. The third P-wave does appear to conduct. The QRS is wide and different from her underlying right bundle-branch block.   Impression and Recommendations    HYPERLIPIDEMIA   Tobacco abuse        Patient will need smoking cessation during the hospitalization    CAD (coronary artery disease)     Patient has known coronary disease. Her last nuclear scan in 2012 showed no marked ischemia. She has had an intervention to the right coronary in the past. It seems unlikely that her rhythm currently is related to ongoing ischemia but this has to be considered. Further troponins will be checked. After her rhythm is stabilized decisions will be made as to whether or not proceeding with diagnostic cardiac catheterization is appropriate.    DM (diabetes mellitus)     Diabetes will be treated while she is in the hospital.    Carotid stenosis        Hyperkalemia      Potassium is 5.6. This may be related to the change in renal function. I do not think it is the cause of her AV block.    Elevated troponin     Her troponin is 0.22. Decision will have to be made as to whether or not this is demand ischemia for marked bradycardia or whether or not she is having ongoing ischemia from her right coronary artery.    Nausea     Etiology of nausea is not yet clear. Certainly it could be related to her bradycardia. However her appetite has been poor. Once her rhythm is stabilized she will need further GI evaluation.    Weight loss     Further assessment of her weight loss will be necessary.        AV block      Patient has high degree AV block. Potassium was 5.6. The rhythm did not respond to an amp of calcium. She is on low-dose beta blocker. She requires temporary pacing at this time. Then as  she is stabilized further decisions will be made.    Acute renal failure     Patient's creatinine is higher than previously assessed. She needs careful hydration and following of her renal function.  At this point it is difficult  to be sure of the order of all of her problems. However she has high degree AV block. Temporary pacing will be done. She is being assessed formally by the electrophysiology team to decide if she should receive a permanent pacemaker very soon. Along with this her nausea and weight loss  and elevated troponin will be assessed.   Signed: Dola Argyle 11/13/2013, 10:27 AM

## 2013-11-13 NOTE — CV Procedure (Signed)
CARDIAC CATHETERIZATION REPORT  NAME:  [name]   MRN: [id number] DOB:  1925/09/23   ADMIT DATE: 11/13/2013 Procedure Date: 11/13/2013  INTERVENTIONAL CARDIOLOGIST: Leonie Man, M.D., MS PRIMARY CARE PROVIDER: Joycelyn Man, MD PRIMARY CARDIOLOGIST: Dr. Vita Barley  PATIENT:  [name] is a 80 y.o. female with a history of coronary disease status post ostial RCA and mid LAD stent with redo stent to the mid RCA for ISR. Her last Myoview demonstrated inferior scar with minimal peri-infarct ischemia. She is had a history of several days poor appetite weight loss nausea confusion. Upon presentation to the ER, she was found to be in High-Grade AV Block 3:1. Her blood pressures were stable however she is quite symptomatic with persistent nausea. She was mildly hyperkalemic, but no response or calcium. There is no response to atropine either. She was seen in consultation by Dr. Ron Parker in the emergency room as well as by the left physiology service. There was concern her symptoms, she had potential to decompensate cardia therefore is referred for temporary pacer placement. With history of RCA disease, was asked also perform left heart catheterization coronary angiography to assess for any ischemic etiology.  PRE-OPERATIVE DIAGNOSIS:    High Grade 3:1 AV Block  PROCEDURES PERFORMED:    Temporary Transvenous Pacemaker Placement  Left Heart Catheterization with Coronary Angiography  PROCEDURE:Consent:  Risks of procedure as well as the alternatives and risks of each were explained to the (patient/caregiver).  Consent for procedure obtained. Consent for signed by MD and patient with RN witness -- placed on chart.   PROCEDURE: The patient was brought to the 2nd [facility] in the fasting state and prepped and draped in the usual sterile fashion for Right femoral access. Due to patient's size, and need for pacemaker, the decision was made to avoid  radial access and use femoral access. Sterile technique was used including antiseptics, cap, gloves, gown, hand hygiene, mask and sheet.  Skin prep: Chlorhexidine.  Time Out: Verified patient identification, verified procedure, site/side was marked, verified correct patient position, special equipment/implants available, medications/allergies/relevent history reviewed, required imaging and test results available.  Performed  Access:   Right Common Femoral Artery: 5 Fr Sheath -- fluoroscopically guided modified Seldinger technique   Right Common Femoral Vein: 6 Fr Sheath, modified Seldinger technique  Temporary Transvenous Pacer Placement  5 French transvenous pacer wire was advanced under fluoroscopic guidance with balloon inflated into the right ventricle.  With the catheter in place, thresholds were checked. On re\re adjusted, the vessel was 1.5 mAmp.    Settings were then established at 80 beats per minute, 25mAmp  The catheter was inserted at 52 cm in depth, the catheter cover was secured to the sheath, and the sheath was sutured in place upon completion of the procedure.  Diagnostic:  5 French catheters advanced and exchanged over a J-wire.  Left Coronary Artery Angiography: JL4  Right Coronary Artery Angiography: Very difficult to engage due to the ostial stent. Catheters attempted included JR 4, MP A2, RCB, No Torque, AL-1; finally an AR-1 was used to engage the RCA with much difficulty.  LV Hemodynamics: The MPA 2 was used to cross the aortic valve during catheter exchange. Left ventricular hemodynamics were measured, but a left ventriculogram was not performed in order to conserve contrast.  Arterial sheath:   Removed with manual pressure being held in the Cath Lab for hemostasis. The venous sheath was sutured in place.  MEDICATIONS:  Anesthesia:  Local Lidocaine 16 ml  Sedation:  0.5 mg IV Versed, 12.5 mcg IV fentanyl ;   Omnipaque Contrast: 70 ml  Hydralazine 10 mg  IV  Hemodynamics:  Central Aortic / Mean Pressures: 141/52 mmHg; 88 mmHg  Left Ventricular Pressures / EDP: 141/7 mmHg; 10 mmHg  Left Ventriculography:  EF: Not evaluated to conserve contrast  Coronary Anatomy: Very difficult angiography as there is extreme tortuosity in the left coronary system leading to of vessel overlap, and the ostial RCA stent protrudes into the aorta making this vessel difficult to engage.  Left Main: Large-caliber vessel that bifurcates in a downward fashion into the LAD, and Circumflex. Mild calcification but no significant lesion. LAD: Large-caliber very tortuous vessel that gives off a very prominent septal perforator trunk and the proximal segment. There are 2 small to moderate caliber diagonal branches. In between D1/SP1 and D2, there is a long tubular 50-60% stenosis. There appears to be stented segment just beyond D2 that is widely patent. Beyond this the vessel extremely tortuous as it reaches down around the apex.  Left Circumflex: Large-caliber vessel it gives rise to a proximal marginal branch that is small to moderate caliber (less than 2 mm) with a possible ~70% irregular stenosis. This vessel is likely not a good PCI candidate, and clearly not cause of the high grade AV block. The vessel then courses in the AV groove giving rise to OM 1 and 2.  OM1: Small- Moderate caliber, very tortuous vessel that tapers into very small-caliber vessel along the inferolateral wall.  OM 2: Moderate caliber vessel that courses along the inferior lateral border bifurcating distally. In the segment between OM 1 and 2, there is a focal 50-60% stenosis. A small AV groove branch comes off just beyond the stenosis.   RCA: Large caliber, dominant vessel with an ostial stent is widely patent. There is in mild 40-50% lesions in 2 places within the mid vessel. The RCA then bifurcates distally into the right posterior descending artery That (RPDA) and the  Right Posterior AV Groove  Branch (RPAV)  RPDA: Moderate caliber, tortuous vessel that tapers into a very small vessel before reaching two thirds the way to the apex.  RPL Sysytem:The RPAV begins as a smaller moderate caliber vessel gives rise to several small branches and bifurcates distally into 2 small RPL branches.  PATIENT DISPOSITION:    The patient was transferred to the PACU holding area in a hemodynamicaly stable, chest pain free condition.  The patient tolerated the procedure well, and there were no complications.  EBL:   < 10 ml  The patient was stable before, during, and after the procedure.  POST-OPERATIVE DIAGNOSIS:    High-Grade Second Degree AV Block with preprocedural heart rate in the high 20s-30s.  Successful percutaneous Transvenous Temporary Pacemaker Placement  Mild to moderate diffuse coronary disease involving 50-60% stenosis in the mid LAD and circumflex between OM1 and OM 2 with more significant lesion in a small ramus-type marginal branch of the circumflex.  Widely patent RCA stent but no significant lesions in the RCA to explain High-Grade AV Block.  PLAN OF CARE:  The patient will be admitted to the ICU where she'll be seen by Dr. physiology. I would anticipate that we would hold the beta blocker.  Continue transvenous pacing until that time the patient does or does not receive permanent pacemaker placement.  Post cath hydration for mild renal insufficiency.   Leonie Man, M.D., M.S. [facility] [address]  [place], [place]  [id number]  [phone]  11/13/2013 12:02 PM

## 2013-11-13 NOTE — Progress Notes (Signed)
Echo Lab  2D Echocardiogram completed.  [place], RDCS 11/13/2013 5:20 PM

## 2013-11-13 NOTE — ED Notes (Signed)
Results of troponin shown to Dr. Alvino Chapel

## 2013-11-13 NOTE — ED Notes (Signed)
Systolic BP 397 (manually) MD aware

## 2013-11-13 NOTE — Telephone Encounter (Signed)
[name] please call the daughter......... the next option would be to have her take her mother over to the [facility] and be seen by a psychologist

## 2013-11-13 NOTE — ED Notes (Signed)
Daughter at bedside.

## 2013-11-14 ENCOUNTER — Encounter (HOSPITAL_COMMUNITY): Payer: Self-pay | Admitting: *Deleted

## 2013-11-14 ENCOUNTER — Encounter (HOSPITAL_COMMUNITY)
Admission: EM | Disposition: A | Discharge status: Home Health | Payer: Self-pay | Source: Home / Self Care | Attending: Cardiology

## 2013-11-14 DIAGNOSIS — I442 Atrioventricular block, complete: Secondary | ICD-10-CM

## 2013-11-14 DIAGNOSIS — I2510 Atherosclerotic heart disease of native coronary artery without angina pectoris: Secondary | ICD-10-CM

## 2013-11-14 DIAGNOSIS — R799 Abnormal finding of blood chemistry, unspecified: Secondary | ICD-10-CM

## 2013-11-14 HISTORY — PX: PERMANENT PACEMAKER INSERTION: SHX5480

## 2013-11-14 LAB — BASIC METABOLIC PANEL
BUN: 55 mg/dL — ABNORMAL HIGH (ref 6–23)
BUN: 58 mg/dL — AB (ref 6–23)
CALCIUM: 8.6 mg/dL (ref 8.4–10.5)
CO2: 18 meq/L — AB (ref 19–32)
CO2: 19 mEq/L (ref 19–32)
CREATININE: 1.35 mg/dL — AB (ref 0.50–1.10)
Calcium: 7.9 mg/dL — ABNORMAL LOW (ref 8.4–10.5)
Chloride: 105 mEq/L (ref 96–112)
Chloride: 107 mEq/L (ref 96–112)
Creatinine, Ser: 1.23 mg/dL — ABNORMAL HIGH (ref 0.50–1.10)
GFR calc Af Amer: 40 mL/min — ABNORMAL LOW (ref >90)
GFR calc Af Amer: 45 mL/min — ABNORMAL LOW (ref >90)
GFR, EST NON AFRICAN AMERICAN: 34 mL/min — AB (ref >90)
GFR, EST NON AFRICAN AMERICAN: 39 mL/min — AB (ref >90)
GLUCOSE: 131 mg/dL — AB (ref 70–99)
Glucose, Bld: 122 mg/dL — ABNORMAL HIGH (ref 70–99)
POTASSIUM: 5 meq/L (ref 3.7–5.3)
Potassium: 5.2 mEq/L (ref 3.7–5.3)
SODIUM: 141 meq/L (ref 137–147)
SODIUM: 142 meq/L (ref 137–147)

## 2013-11-14 LAB — CBC
HCT: 41.3 % (ref 36.0–46.0)
HEMOGLOBIN: 13.4 g/dL (ref 12.0–15.0)
MCH: 29.1 pg (ref 26.0–34.0)
MCHC: 32.4 g/dL (ref 30.0–36.0)
MCV: 89.8 fL (ref 78.0–100.0)
Platelets: 214 10*3/uL (ref 150–400)
RBC: 4.6 MIL/uL (ref 3.87–5.11)
RDW: 16.1 % — AB (ref 11.5–15.5)
WBC: 14.8 10*3/uL — ABNORMAL HIGH (ref 4.0–10.5)

## 2013-11-14 LAB — LIPID PANEL
CHOLESTEROL: 167 mg/dL (ref 0–200)
HDL: 30 mg/dL — ABNORMAL LOW (ref >39)
LDL Cholesterol: 105 mg/dL — ABNORMAL HIGH (ref 0–99)
TRIGLYCERIDES: 160 mg/dL — AB (ref <150)
Total CHOL/HDL Ratio: 5.6 RATIO
VLDL: 32 mg/dL (ref 0–40)

## 2013-11-14 LAB — URINALYSIS, ROUTINE W REFLEX MICROSCOPIC
Glucose, UA: NEGATIVE mg/dL
HGB URINE DIPSTICK: NEGATIVE
Ketones, ur: NEGATIVE mg/dL
Leukocytes, UA: NEGATIVE
Nitrite: NEGATIVE
Specific Gravity, Urine: 1.035 — ABNORMAL HIGH (ref 1.005–1.030)
UROBILINOGEN UA: 0.2 mg/dL (ref 0.0–1.0)
pH: 5 (ref 5.0–8.0)

## 2013-11-14 LAB — URINE MICROSCOPIC-ADD ON

## 2013-11-14 LAB — T4, FREE: Free T4: 1.14 ng/dL (ref 0.80–1.80)

## 2013-11-14 LAB — GLUCOSE, CAPILLARY
GLUCOSE-CAPILLARY: 126 mg/dL — AB (ref 70–99)
Glucose-Capillary: 100 mg/dL — ABNORMAL HIGH (ref 70–99)
Glucose-Capillary: 104 mg/dL — ABNORMAL HIGH (ref 70–99)
Glucose-Capillary: 106 mg/dL — ABNORMAL HIGH (ref 70–99)

## 2013-11-14 LAB — TROPONIN I: Troponin I: 0.64 ng/mL (ref <0.30)

## 2013-11-14 LAB — TSH: TSH: 2.407 u[IU]/mL (ref 0.350–4.500)

## 2013-11-14 SURGERY — PERMANENT PACEMAKER INSERTION
Anesthesia: LOCAL

## 2013-11-14 MED ORDER — FENTANYL CITRATE 0.05 MG/ML IJ SOLN
12.5000 ug | Freq: Once | INTRAMUSCULAR | Status: AC
  Administered 2013-11-14: 12.5 ug via INTRAVENOUS
  Filled 2013-11-14: qty 2

## 2013-11-14 MED ORDER — CHLORHEXIDINE GLUCONATE 4 % EX LIQD
60.0000 mL | Freq: Once | CUTANEOUS | Status: AC
  Administered 2013-11-14: 4 via TOPICAL

## 2013-11-14 MED ORDER — SODIUM CHLORIDE 0.9 % IV SOLN: 250.0000 mL | INTRAVENOUS | Status: DC | PRN

## 2013-11-14 MED ORDER — CHLORHEXIDINE GLUCONATE 4 % EX LIQD
CUTANEOUS | Status: AC
  Filled 2013-11-14: qty 30

## 2013-11-14 MED ORDER — SODIUM CHLORIDE 0.9 % IJ SOLN: 3.0000 mL | INTRAMUSCULAR | Status: DC | PRN

## 2013-11-14 MED ORDER — LIDOCAINE HCL (PF) 1 % IJ SOLN
INTRAMUSCULAR | Status: AC
  Filled 2013-11-14: qty 60

## 2013-11-14 MED ORDER — CEFAZOLIN SODIUM 1-5 GM-% IV SOLN
1.0000 g | Freq: Four times a day (QID) | INTRAVENOUS | Status: AC
  Administered 2013-11-14 – 2013-11-15 (×2): 1 g via INTRAVENOUS
  Filled 2013-11-14 (×3): qty 50

## 2013-11-14 MED ORDER — BOOST / RESOURCE BREEZE PO LIQD
1.0000 | Freq: Two times a day (BID) | ORAL | Status: DC
  Administered 2013-11-15 – 2013-11-16 (×3): 1 via ORAL

## 2013-11-14 MED ORDER — HYDROCODONE-ACETAMINOPHEN 5-325 MG PO TABS: 1.0000 | ORAL_TABLET | ORAL | Status: DC | PRN

## 2013-11-14 MED ORDER — HALOPERIDOL LACTATE 5 MG/ML IJ SOLN
0.5000 mg | Freq: Once | INTRAMUSCULAR | Status: AC
  Administered 2013-11-14: 0.5 mg via INTRAVENOUS
  Filled 2013-11-14: qty 1

## 2013-11-14 MED ORDER — GENTAMICIN SULFATE 40 MG/ML IJ SOLN
80.0000 mg | INTRAMUSCULAR | Status: DC
  Filled 2013-11-14: qty 2

## 2013-11-14 MED ORDER — HEPARIN (PORCINE) IN NACL 2-0.9 UNIT/ML-% IJ SOLN
INTRAMUSCULAR | Status: AC
  Filled 2013-11-14: qty 500

## 2013-11-14 MED ORDER — SODIUM CHLORIDE 0.9 % IJ SOLN
3.0000 mL | Freq: Two times a day (BID) | INTRAMUSCULAR | Status: DC
  Administered 2013-11-14 – 2013-11-15 (×3): 3 mL via INTRAVENOUS

## 2013-11-14 MED ORDER — CEFAZOLIN SODIUM-DEXTROSE 2-3 GM-% IV SOLR
2.0000 g | INTRAVENOUS | Status: DC
Start: 2013-11-14 — End: 2013-11-14
  Filled 2013-11-14: qty 50

## 2013-11-14 MED ORDER — SODIUM CHLORIDE 0.9 % IV SOLN
Freq: Once | INTRAVENOUS | Status: AC
  Administered 2013-11-14: via INTRAVENOUS

## 2013-11-14 MED ORDER — SODIUM CHLORIDE 0.9 % IV SOLN: INTRAVENOUS | Status: DC

## 2013-11-14 MED ORDER — ACETAMINOPHEN 325 MG PO TABS
325.0000 mg | ORAL_TABLET | ORAL | Status: DC | PRN
  Administered 2013-11-15 – 2013-11-16 (×2): 650 mg via ORAL
  Filled 2013-11-14 (×2): qty 2

## 2013-11-14 MED ORDER — ONDANSETRON HCL 4 MG/2ML IJ SOLN
4.0000 mg | Freq: Four times a day (QID) | INTRAMUSCULAR | Status: DC | PRN
Start: 2013-11-14 — End: 2013-11-16

## 2013-11-14 NOTE — Progress Notes (Signed)
Patients's daughter notified of increasing agitation and confusion. MD has ordered 0.5mg  of Haldol. Medication given and safety mittens applied.

## 2013-11-14 NOTE — Clinical Documentation Improvement (Signed)
Possible Clinical Conditions?        Severe Protein Calorie Malnutrition      Severe Malnutrition      Cannot clinically determine  Supporting Information:  In the H&P, Dr. Ron Parker documented, "Her daughter says that she has had very poor appetite and losing weight for at least several days.  The patient is thin and frail."   Signs & Symptoms:  Her admission weight is 66 lbs with a BMI of 12.9  Dr. Ron Parker also documented, "Weight loss: Further assessment of her weight loss will be necessary."     Thank You,  Posey Pronto, RN, BSN, [place] Improvement Specialist HIM department--[place] Office [phone]

## 2013-11-14 NOTE — H&P (View-Only) (Signed)
SUBJECTIVE: The patient is confused this morning.  S/p LHC and temp wire placement yesterday.  Catheterization demonstrated mild to moderate diffuse coronary disease with widely patent RCA stent.    CURRENT MEDICATIONS: . aspirin EC  81 mg Oral Daily  . calcium gluconate  1 g Intravenous Once  . heparin  5,000 Units Subcutaneous Q8H  . sodium chloride  10-40 mL Intracatheter Q12H  . sodium chloride  3 mL Intravenous Q12H   . sodium chloride Stopped (11/14/13 0400)  . sodium chloride      OBJECTIVE: Physical Exam: Filed Vitals:   11/14/13 0500 11/14/13 0600 11/14/13 0700 11/14/13 0800  BP: 101/64 133/57 157/70 130/46  Pulse: 96 78 80 80  Temp:      TempSrc:    Oral  Resp: 20 18 19 20   Height:      Weight:      SpO2: 100% 100% 100% 100%    Intake/Output Summary (Last 24 hours) at 11/14/13 0932 Last data filed at 11/14/13 0900  Gross per 24 hour  Intake 1842.5 ml  Output    300 ml  Net 1542.5 ml    Telemetry reveals ventricular pacing at 80  GEN- The patient is elderly appearing, alert and pleasant, thin and frail Head- normocephalic, atraumatic Eyes-  Sclera clear, conjunctiva pink Ears- hearing intact Oropharynx- clear Neck- supple, Lungs- Clear to ausculation bilaterally, normal work of breathing Heart- Regular rate and rhythm (paced) GI- soft, NT, ND, + BS Extremities- no clubbing, cyanosis, or edema Skin- no rash or lesion Psych- euthymic mood, full affect Neuro- strength and sensation are intact  LABS: Basic Metabolic Panel:  Recent Labs  11/13/13 2350 11/14/13 0450  NA 142 141  K 5.0 5.2  CL 107 105  CO2 18* 19  GLUCOSE 122* 131*  BUN 55* 58*  CREATININE 1.23* 1.35*  CALCIUM 7.9* 8.6   Liver Function Tests:  Recent Labs  11/13/13 1005  AST 34  ALT 38*  ALKPHOS 108  BILITOT 0.3  PROT 7.0  ALBUMIN 3.3*   CBC:  Recent Labs  11/13/13 1005 11/13/13 1757 11/14/13 0450  WBC 11.8* 13.1* 14.8*  NEUTROABS 9.9*  --   --   HGB  15.7* 14.2 13.4  HCT 47.2* 43.0 41.3  MCV 90.6 90.0 89.8  PLT 228 229 214   Cardiac Enzymes:  Recent Labs  11/13/13 1400 11/13/13 1757 11/13/13 2345  TROPONINI 0.31* 0.64* 0.64*   Fasting Lipid Panel:  Recent Labs  11/14/13 0450  CHOL 167  HDL 30*  LDLCALC 105*  TRIG 160*  CHOLHDL 5.6   Thyroid Function Tests:  Recent Labs  11/13/13 1420  TSH 2.407    RADIOLOGY: No results found.  ASSESSMENT AND PLAN:  Active Problems:   HYPERLIPIDEMIA   Tobacco abuse   CAD (coronary artery disease)   DM (diabetes mellitus)   Carotid stenosis   Hyperkalemia   Elevated troponin   Nausea   Weight loss   AV block   Acute renal failure   Complete heart block   AV block, 2nd degree  1. Complete heart block The patients rhythm remains complete heart block with no underlying escape despite beta blocker washout.  She will require beta blocker therapy long term given her CAD.  I had a long discussion with the patient and her daughter. I would recommend pacemaker implantation at this time.  Risks, benefits, alternatives to pacemaker implantation were discussed in detail with the patient today. The patient understands that the risks  include but are not limited to bleeding, infection, pneumothorax, perforation, tamponade, vascular damage, renal failure, MI, stroke, death,  and lead dislodgement and wishes to proceed. We will therefore schedule the procedure for later today  2. CAD Stable No change required today Resume beta blocker after PPM implant  3. Confusion stable.   

## 2013-11-14 NOTE — Op Note (Signed)
SURGEON:  Thompson Grayer, MD     PREPROCEDURE DIAGNOSIS:  Symptomatic complete heart block    POSTPROCEDURE DIAGNOSIS:  Symptomatic complete heart block     PROCEDURES:   1. Left upper extremity venography.   2. Pacemaker implantation.     INTRODUCTION: [name] is a 80 y.o. female  with a history of symptomatic complete heart block who presents today for pacemaker implantation.  The patient reports intermittent episodes of dizziness over the past few days.  She presents with complete heart block requiring emergent temporary pacing.  Despite washout of metoprolol, her complete heart block is not improved.  She is felt to require beta blockers with her CAD long term.  No reversible causes have been identified.  The patient therefore presents today for pacemaker implantation.     DESCRIPTION OF PROCEDURE:  Informed written consent was obtained, and the patient was brought to the electrophysiology lab in a fasting state.  The patient required no sedation for the procedure today.  The patients left chest was prepped and draped in the usual sterile fashion by the EP lab staff. The skin overlying the left deltopectoral region was infiltrated with lidocaine for local analgesia.  A 4-cm incision was made over the left deltopectoral region.  A left subcutaneous pacemaker pocket was fashioned using a combination of sharp and blunt dissection. Electrocautery was required to assure hemostasis.    Left Upper Extremity Venography: A venogram of the left upper extremity was performed, which revealed a small left cephalic vein, which emptied into a large left subclavian vein.  The left axillary vein was moderate in size.    RA/RV Lead Placement: The left axillary vein was cannulated.  Through the left axillary vein, a Medtronic model [id number] (serial number [id number]) right atrial lead and a Medtronic model 9450- 58 (serial number [id number]) right ventricular lead were advanced with fluoroscopic  visualization into the right atrial appendage and right ventricular apex positions respectively.  Initial atrial lead P- waves measured 3.5 mV with impedance of 747 ohms and a threshold of 1.3 V at 0.5 msec.  Right ventricular lead R-waves measured 25 mV with an impedance of 630 ohms and a threshold of 0.5 V at 0.5 msec.  Both leads were secured to the pectoralis fascia using #2-0 silk over the suture sleeves.   Device Placement:  The leads were then connected to a Medtronic Adapta L model ADDRL 1 (serial number [id number]) pacemaker.  The pocket was irrigated with copious gentamicin solution.  The pacemaker was then placed into the pocket.  The pocket was then closed in 2 layers with 2.0 Vicryl suture for the subcutaneous and subcuticular layers.  Steri- Strips and a sterile dressing were then applied.  There were no early apparent complications.     CONCLUSIONS:   1. Successful implantation of a Medtronic Adapta L dual-chamber pacemaker for symptomatic complete heart block  2. No early apparent complications.       Thompson Grayer, MD 11/14/2013 5:06 PM

## 2013-11-14 NOTE — Progress Notes (Signed)
INITIAL NUTRITION ASSESSMENT  DOCUMENTATION CODES Per approved criteria  -Severe malnutrition in the context of chronic illness   INTERVENTION: 1.  General healthful diet; encourage intake of foods and beverages as able.  RD to follow and assess for nutritional adequacy.  2.  Supplements; Resource Breeze po BID, each supplement provides 250 kcal and 9 grams of protein  NUTRITION DIAGNOSIS: Inadequate oral intake related to poor appetite as evidenced by pt report.   Monitor:  1.  Food/Beverage; pt meeting >/=90% estimated needs with tolerance. 2.  Wt/wt change; monitor trends  Reason for Assessment: MST  80 y.o. female  Admitting Dx: confusion  ASSESSMENT: Pt admitted with confusion and several days of poor PO intake. Pt lives at home alone.  RD introduced self and role to pt and daughter. Pt immediately responds that she eats "alright" at home.  She reports she doesn't cook.  Daughter reports she cooks for pt and drops of food for her daily.  Pt minimizes her lack of nutrition/eating, stating "but I haven't lost any more weight."  She believes she has lost "a few pounds over the past few years" and states "I've been in the 60s (pounds) for years."  Daughter reports that pt continues to lose weight and only eats a few bites of what she is given at home.  Pt does not regularly consume Ensure supplements although she has them at home. Pt believes she is at an appropriate wt for her age.  RD attempted to redirect conversation to safety with good mobility at home, however pt is not receptive at this time. She is very focused on her thirst.  RD provided with ice chips.   Nutrition Focused Physical Exam: Subcutaneous Fat:  Orbital Region: moderate-severe wasting Upper Arm Region: severe wasting Thoracic and Lumbar Region: severe wasting  Muscle:  Temple Region: moderate-severe wasting Clavicle Bone Region: severe wasting Clavicle and Acromion Bone Region: severe wasting Scapular Bone  Region: severe wasting Dorsal Hand: moderate-severe wasting Patellar Region: severe wasting Anterior Thigh Region: severe wasting Posterior Calf Region: moderate-severe wasting  Edema: none present  Height: Ht Readings from Last 1 Encounters:  11/13/13 5' (1.524 m)    Weight: Wt Readings from Last 1 Encounters:  11/13/13 66 lb 2.2 oz (30 kg)    Ideal Body Weight: 100 lbs  % Ideal Body Weight: 66%  Wt Readings from Last 10 Encounters:  11/13/13 66 lb 2.2 oz (30 kg)  11/13/13 66 lb 2.2 oz (30 kg)  10/15/13 66 lb (29.937 kg)  08/21/13 66 lb (29.937 kg)  06/27/13 65 lb (29.484 kg)  06/26/13 65 lb 4 oz (29.597 kg)  04/02/13 69 lb (31.298 kg)  03/12/13 68 lb (30.845 kg)  06/26/12 76 lb (34.473 kg)  06/19/12 73 lb 12.8 oz (33.475 kg)    Usual Body Weight: 75 lbs, >1 yr ago  % Usual Body Weight: 88%  BMI:  Body mass index is 12.92 kg/(m^2).  Estimated Nutritional Needs: Kcal: 1000-1100 Protein: 36-45g Fluid: >1.2 L/d  Skin: intact  Diet Order: Clear Liquid  EDUCATION NEEDS: -Education needs addressed   Intake/Output Summary (Last 24 hours) at 11/14/13 1207 Last data filed at 11/14/13 0900  Gross per 24 hour  Intake 1842.5 ml  Output    300 ml  Net 1542.5 ml    Last BM: PTA  Labs:   Recent Labs Lab 11/13/13 1005 11/13/13 1420 11/13/13 2350 11/14/13 0450  NA 140  --  142 141  K 6.4*  --  5.0  5.2  CL 96  --  107 105  CO2 24  --  18* 19  BUN 67*  --  55* 58*  CREATININE 1.48* 1.41* 1.23* 1.35*  CALCIUM 9.2  --  7.9* 8.6  GLUCOSE 155*  --  122* 131*    CBG (last 3)   Recent Labs  11/13/13 2334 11/14/13 0752 11/14/13 1137  GLUCAP 119* 100* 104*    Scheduled Meds: . aspirin EC  81 mg Oral Daily  . calcium gluconate  1 g Intravenous Once  . heparin  5,000 Units Subcutaneous Q8H  . sodium chloride  10-40 mL Intracatheter Q12H  . sodium chloride  3 mL Intravenous Q12H    Continuous Infusions: . sodium chloride Stopped (11/14/13 0400)   . sodium chloride      Past Medical History  Diagnosis Date  . CAD (coronary artery disease)     prior stent to the LAD and RCA;  Last [facility] 9/05:  ostial LAD 25%, mid 25%, ostial diagonal 30%, proximal circumflex 25%, ostial OM1 25%, mid RCA 99% in-stent restenosis, then 50 and 40%.  PCI: Taxus DES to the mid RCA.  Last Myoview 5/12: EF 69%, inferior scar with minimal peri-infarct ischemia.  Last echo 2/03: EF 45-55%.  . Depression   . DM (diabetes mellitus)   . Hyperlipidemia   . Colon polyp   . Hyperplastic rectal polyp   . Ovarian cancer   . Cataract   . Bowel obstruction   . H/O: hysterectomy   . HTN (hypertension)   . Carotid stenosis     s/p right CEA 2000;  Dopplers 10/12: RICA 40-59%, right CEA okay, LICA 373%-SKAJGOTL one year    Past Surgical History  Procedure Laterality Date  . Angioplasty    . Abdominal hysterectomy      Brynda Greathouse, MS RD LDN Clinical Inpatient Dietitian Pager: [phone] Weekend/After hours pager: [phone]

## 2013-11-14 NOTE — Progress Notes (Signed)
SUBJECTIVE: The patient is confused this morning.  S/p LHC and temp wire placement yesterday.  Catheterization demonstrated mild to moderate diffuse coronary disease with widely patent RCA stent.    CURRENT MEDICATIONS: . aspirin EC  81 mg Oral Daily  . calcium gluconate  1 g Intravenous Once  . heparin  5,000 Units Subcutaneous Q8H  . sodium chloride  10-40 mL Intracatheter Q12H  . sodium chloride  3 mL Intravenous Q12H   . sodium chloride Stopped (11/14/13 0400)  . sodium chloride      OBJECTIVE: Physical Exam: Filed Vitals:   11/14/13 0500 11/14/13 0600 11/14/13 0700 11/14/13 0800  BP: 101/64 133/57 157/70 130/46  Pulse: 96 78 80 80  Temp:      TempSrc:    Oral  Resp: 20 18 19 20   Height:      Weight:      SpO2: 100% 100% 100% 100%    Intake/Output Summary (Last 24 hours) at 11/14/13 0932 Last data filed at 11/14/13 0900  Gross per 24 hour  Intake 1842.5 ml  Output    300 ml  Net 1542.5 ml    Telemetry reveals ventricular pacing at 80  GEN- The patient is elderly appearing, alert and pleasant, thin and frail Head- normocephalic, atraumatic Eyes-  Sclera clear, conjunctiva pink Ears- hearing intact Oropharynx- clear Neck- supple, Lungs- Clear to ausculation bilaterally, normal work of breathing Heart- Regular rate and rhythm (paced) GI- soft, NT, ND, + BS Extremities- no clubbing, cyanosis, or edema Skin- no rash or lesion Psych- euthymic mood, full affect Neuro- strength and sensation are intact  LABS: Basic Metabolic Panel:  Recent Labs  11/13/13 2350 11/14/13 0450  NA 142 141  K 5.0 5.2  CL 107 105  CO2 18* 19  GLUCOSE 122* 131*  BUN 55* 58*  CREATININE 1.23* 1.35*  CALCIUM 7.9* 8.6   Liver Function Tests:  Recent Labs  11/13/13 1005  AST 34  ALT 38*  ALKPHOS 108  BILITOT 0.3  PROT 7.0  ALBUMIN 3.3*   CBC:  Recent Labs  11/13/13 1005 11/13/13 1757 11/14/13 0450  WBC 11.8* 13.1* 14.8*  NEUTROABS 9.9*  --   --   HGB  15.7* 14.2 13.4  HCT 47.2* 43.0 41.3  MCV 90.6 90.0 89.8  PLT 228 229 214   Cardiac Enzymes:  Recent Labs  11/13/13 1400 11/13/13 1757 11/13/13 2345  TROPONINI 0.31* 0.64* 0.64*   Fasting Lipid Panel:  Recent Labs  11/14/13 0450  CHOL 167  HDL 30*  LDLCALC 105*  TRIG 160*  CHOLHDL 5.6   Thyroid Function Tests:  Recent Labs  11/13/13 1420  TSH 2.407    RADIOLOGY: No results found.  ASSESSMENT AND PLAN:  Active Problems:   HYPERLIPIDEMIA   Tobacco abuse   CAD (coronary artery disease)   DM (diabetes mellitus)   Carotid stenosis   Hyperkalemia   Elevated troponin   Nausea   Weight loss   AV block   Acute renal failure   Complete heart block   AV block, 2nd degree  1. Complete heart block The patients rhythm remains complete heart block with no underlying escape despite beta blocker washout.  She will require beta blocker therapy long term given her CAD.  I had a long discussion with the patient and her daughter. I would recommend pacemaker implantation at this time.  Risks, benefits, alternatives to pacemaker implantation were discussed in detail with the patient today. The patient understands that the risks  include but are not limited to bleeding, infection, pneumothorax, perforation, tamponade, vascular damage, renal failure, MI, stroke, death,  and lead dislodgement and wishes to proceed. We will therefore schedule the procedure for later today  2. CAD Stable No change required today Resume beta blocker after PPM implant  3. Confusion stable.

## 2013-11-14 NOTE — Progress Notes (Signed)
S/p PPM today for persistent complete heart block  I have spoken with her family who feels that she may be ready to go home on Sunday but not Saturday. Hopefully, PT can see on Saturday and she can go home on Sunday. The patients family is clear that they do not wish for SNF/NH placement at this time.

## 2013-11-14 NOTE — Interval H&P Note (Signed)
History and Physical Interval Note:  11/14/2013 1:56 PM  [name]  has presented today for surgery, with the diagnosis of Heart failure  The various methods of treatment have been discussed with the patient and family. After consideration of risks, benefits and other options for treatment, the patient has consented to  Procedure(s): PERMANENT PACEMAKER INSERTION (N/A) as a surgical intervention .  The patient's history has been reviewed, patient examined, no change in status, stable for surgery.  I have reviewed the patient's chart and labs.  Questions were answered to the patient's satisfaction.     Thompson Grayer

## 2013-11-15 ENCOUNTER — Inpatient Hospital Stay (HOSPITAL_COMMUNITY): Payer: Medicare Other

## 2013-11-15 DIAGNOSIS — R634 Abnormal weight loss: Secondary | ICD-10-CM

## 2013-11-15 DIAGNOSIS — I10 Essential (primary) hypertension: Secondary | ICD-10-CM

## 2013-11-15 DIAGNOSIS — F17200 Nicotine dependence, unspecified, uncomplicated: Secondary | ICD-10-CM

## 2013-11-15 LAB — GLUCOSE, CAPILLARY
GLUCOSE-CAPILLARY: 134 mg/dL — AB (ref 70–99)
GLUCOSE-CAPILLARY: 162 mg/dL — AB (ref 70–99)
Glucose-Capillary: 161 mg/dL — ABNORMAL HIGH (ref 70–99)

## 2013-11-15 LAB — BASIC METABOLIC PANEL
BUN: 47 mg/dL — ABNORMAL HIGH (ref 6–23)
CALCIUM: 8.4 mg/dL (ref 8.4–10.5)
CO2: 22 mEq/L (ref 19–32)
Chloride: 106 mEq/L (ref 96–112)
Creatinine, Ser: 1.03 mg/dL (ref 0.50–1.10)
GFR calc Af Amer: 55 mL/min — ABNORMAL LOW (ref >90)
GFR, EST NON AFRICAN AMERICAN: 48 mL/min — AB (ref >90)
Glucose, Bld: 137 mg/dL — ABNORMAL HIGH (ref 70–99)
Potassium: 4.9 mEq/L (ref 3.7–5.3)
Sodium: 141 mEq/L (ref 137–147)

## 2013-11-15 LAB — TSH: TSH: 2.239 u[IU]/mL (ref 0.350–4.500)

## 2013-11-15 LAB — FOLATE: Folate: 16.8 ng/mL

## 2013-11-15 LAB — VITAMIN B12: Vitamin B-12: 1027 pg/mL — ABNORMAL HIGH (ref 211–911)

## 2013-11-15 MED ORDER — NICOTINE 21 MG/24HR TD PT24
21.0000 mg | MEDICATED_PATCH | Freq: Every day | TRANSDERMAL | Status: DC
  Administered 2013-11-15 – 2013-11-16 (×2): 21 mg via TRANSDERMAL
  Filled 2013-11-15 (×2): qty 1

## 2013-11-15 MED ORDER — LORAZEPAM 0.5 MG PO TABS: 0.5000 mg | ORAL_TABLET | Freq: Three times a day (TID) | ORAL | Status: DC | PRN

## 2013-11-15 MED ORDER — MEGESTROL ACETATE 40 MG PO TABS
40.0000 mg | ORAL_TABLET | Freq: Every day | ORAL | Status: DC
  Administered 2013-11-15 – 2013-11-16 (×2): 40 mg via ORAL
  Filled 2013-11-15 (×2): qty 1

## 2013-11-15 MED ORDER — MIRTAZAPINE 7.5 MG PO TABS
7.5000 mg | ORAL_TABLET | Freq: Every day | ORAL | Status: DC
  Administered 2013-11-15: 7.5 mg via ORAL
  Filled 2013-11-15 (×2): qty 1

## 2013-11-15 MED ORDER — LORAZEPAM 0.5 MG PO TABS: 0.5000 mg | ORAL_TABLET | Freq: Four times a day (QID) | ORAL | Status: DC | PRN

## 2013-11-15 MED ORDER — ZOLPIDEM TARTRATE 5 MG PO TABS
5.0000 mg | ORAL_TABLET | Freq: Every day | ORAL | Status: DC
  Administered 2013-11-15: 5 mg via ORAL
  Filled 2013-11-15: qty 1

## 2013-11-15 NOTE — Progress Notes (Signed)
Subjective:  Still confused overnight. No shortness of breath or chest pain.  Objective:  Vital Signs in the last 24 hours: BP 144/52  Pulse 64  Temp(Src) 97.6 F (36.4 C) (Oral)  Resp 18  Ht 5' (1.524 m)  Wt 29.877 kg (65 lb 13.9 oz)  BMI 12.86 kg/m2  SpO2 100%  Physical Exam: Elderly female who is thin and mildly agitated Lungs:  Clear  Cardiac:  Regular rhythm, normal S1 and S2, no S3 Extremities:  Pacemaker has mild ecchymoses but no hematoma, clean and dry  Intake/Output from previous day: 01/16 0701 - 01/17 0700 In: 220 [P.O.:60; I.V.:160] Out: 725 [Urine:725] Weight Filed Weights   11/13/13 1650 11/15/13 0000  Weight: 30 kg (66 lb 2.2 oz) 29.877 kg (65 lb 13.9 oz)    Lab Results: Basic Metabolic Panel:  Recent Labs  11/14/13 0450 11/15/13 0440  NA 141 141  K 5.2 4.9  CL 105 106  CO2 19 22  GLUCOSE 131* 137*  BUN 58* 47*  CREATININE 1.35* 1.03    CBC:  Recent Labs  11/13/13 1005 11/13/13 1757 11/14/13 0450  WBC 11.8* 13.1* 14.8*  NEUTROABS 9.9*  --   --   HGB 15.7* 14.2 13.4  HCT 47.2* 43.0 41.3  MCV 90.6 90.0 89.8  PLT 228 229 214    BNP    Component Value Date/Time   PROBNP 944.8* 06/26/2013 1838    Telemetry: Paced rhythm  Assessment/Plan:  #1. Complete heart block with previous pacemaker 2. Coronary artery disease next line 3. Significant confusion  Recommendations:  Daughter is concerned about being able to take care of her mother at home. She wants something given to her to calm her down and states that she might be able to manage her. I ask for a hospitalist consult and social work evaluation.      Kerry Hough  MD [facility]  11/15/2013, 11:50 AM

## 2013-11-15 NOTE — Evaluation (Signed)
Physical Therapy Evaluation Patient Details Name: [name] MRN: [id number] DOB: 05/24/1925 Today's Date: 11/15/2013 Time: 2458-0998 PT Time Calculation (min): 29 min  PT Assessment / Plan / Recommendation History of Present Illness  Patient admitted for PPM placement due to complete heart block.  Clinical Impression  Patient presents with decreased independence with mobility due to deficits listed below and will benefit from skilled PT in the acute setting to allow return home with family assist and HHPT.  Did discuss with daughter need for 3:1 for home and she prefers to obtain on her own.    PT Assessment  Patient needs continued PT services    Follow Up Recommendations  Home health PT;Supervision/Assistance - 24 hour    Does the patient have the potential to tolerate intense rehabilitation    N/A  Barriers to Discharge  None      Equipment Recommendations  None recommended by PT    Recommendations for Other Services   None  Frequency Min 3X/week    Precautions / Restrictions Precautions Precautions: Fall;ICD/Pacemaker   Pertinent Vitals/Pain No pain complaints; SpO2 95% on room air      Mobility  Bed Mobility Overal bed mobility: Needs Assistance Bed Mobility: Supine to Sit Supine to sit: Min assist;HOB elevated General bed mobility comments: assist to lift trunk upright and cues not to use left UE to assist Transfers Overall transfer level: Needs assistance Equipment used: 1 person hand held assist Transfers: Sit to/from Stand Sit to Stand: Min assist;Mod assist General transfer comment: increased help needed from low toilet Ambulation/Gait Ambulation/Gait assistance: Min assist Ambulation Distance (Feet): 150 Feet Assistive device: 1 person hand held assist Gait Pattern/deviations: Step-through pattern;Decreased stride length;Narrow base of support General Gait Details: assist for balance    Exercises     PT Diagnosis: Generalized  weakness;Abnormality of gait  PT Problem List: Decreased strength;Decreased knowledge of use of DME;Decreased safety awareness;Decreased activity tolerance;Decreased balance;Decreased mobility PT Treatment Interventions: DME instruction;Balance training;Gait training;Stair training;Functional mobility training;Patient/family education;Therapeutic exercise;Therapeutic activities     PT Goals(Current goals can be found in the care plan section) Acute Rehab PT Goals Patient Stated Goal: To return to independent PT Goal Formulation: With patient/family Time For Goal Achievement: 11/29/13 Potential to Achieve Goals: Good  Visit Information  Last PT Received On: 11/15/13 Assistance Needed: +1 History of Present Illness: Patient admitted for PPM placement due to complete heart block.       Prior [place] expects to be discharged to:: Private residence Living Arrangements: Children Available Help at Discharge: Family;Available 24 hours/day Type of Home: House Home Access: Stairs to enter [employer] of Steps: 7 Entrance Stairs-Rails: Right Home Layout: Two level;Laundry or work area in basement;Able to live on main level with bedroom/bathroom Alternate Therapist, sports of Steps: stays on main Home Equipment: Cane - single point Additional Comments: daughter reports she checks on and assists patient daily and between her and pts son can provide 24 hour help and they prefer to take her home Prior Function Level of Independence: Needs assistance ADL's / Homemaking Assistance Needed: daughter assists with homekeeping, showering, food, etc Communication Communication: No difficulties    Cognition  Cognition Arousal/Alertness: Awake/alert Behavior During Therapy: WFL for tasks assessed/performed Overall Cognitive Status: History of cognitive impairments - at baseline (cognition waxes and wanes; pt thought PPM was her daughters)     Extremity/Trunk Assessment Upper Extremity Assessment Upper Extremity Assessment: Generalized weakness Lower Extremity Assessment Lower Extremity Assessment: Generalized weakness   Balance  Balance Overall balance assessment: Needs assistance Sitting balance-Leahy Scale: Fair Standing balance-Leahy Scale: Poor Standing balance comment: feels unsteady and fearful of falling at times  End of Session PT - End of Session Equipment Utilized During Treatment: Gait belt Activity Tolerance: Patient tolerated treatment well Patient left: in chair;with call bell/phone within reach;with family/visitor present  GP     [facility] 11/15/2013, 5:09 PM Magda Kiel, [place] 11/15/2013

## 2013-11-15 NOTE — Consult Note (Addendum)
Medical Consultation   [name]  GLO:[id number]  DOB: [birth year]  DOA: 11/13/2013  PCP: Joycelyn Man, MD  Requesting physician: Dr Wynonia Lawman  Reason for consultation: Possible altered mental status   History of Present Illness:  Patient is an 80 year old female with history of depression, diabetes, hyperlipidemia, hypertension, CAD who is functional at baseline, lives alone who presented to the hospital on 11/13/2013 with a bradycardia and unresponsiveness. At the time of EMS arrival, patient was alert and oriented but heart rate apparently was in the low 20s.EKGs revealed high-grade AV block. Patient underwent permanent pacemaker for symptomatic complete heart block on 11/14/13. This morning patient was seen by Dr. Wynonia Lawman, patient was mildly ejected and patient's family was concerned about being able to take care of their mother at home. Medicine was consulted for evaluation for possible altered mental status. At the time of my encounter, patient was alert and oriented x3, uncomfortable in bed, wants to go home. Soon after her neighbor, Arbie Cookey arrived during the encounter and confirmed that patient was mostly getting the correct answers. The patient did not know exactly what was the circumstances she was brought to the hospital and why was a pacemaker placed, other than that she was oriented x3.  The neighbor did confirm that patient is very functional, lives alone and has been doing the yard work as well. She did mention that patient had been eating less these days and her daughter had been concerned about her deconditioning.   Allergies:  No Known Allergies    Past Medical History  Diagnosis Date  . CAD (coronary artery disease)     prior stent to the LAD and RCA;  Last [facility] 9/05:  ostial LAD 25%, mid 25%, ostial diagonal 30%, proximal circumflex 25%, ostial OM1 25%, mid RCA 99% in-stent restenosis, then 50 and 40%.  PCI: Taxus DES to the mid RCA.  Last Myoview 5/12: EF 69%,  inferior scar with minimal peri-infarct ischemia.  Last echo 2/03: EF 45-55%.  . Depression   . DM (diabetes mellitus)   . Hyperlipidemia   . Colon polyp   . Hyperplastic rectal polyp   . Ovarian cancer   . Cataract   . Bowel obstruction   . HTN (hypertension)   . Carotid stenosis     s/p right CEA 2000;  Dopplers 10/12: RICA 40-59%, right CEA okay, LICA 188%-[id number] one year    Past Surgical History  Procedure Laterality Date  . Angioplasty    . Abdominal hysterectomy      Social History:  reports that she has been smoking.  She does not have any smokeless tobacco history on file. She reports that she does not drink alcohol or use illicit drugs.  Family History  Problem Relation Age of Onset  . Cancer Other     colon    Review of Systems:  Review of Systems:  Constitutional: Denies fever, chills, diaphoresis, appetite change and fatigue.  HEENT: Denies photophobia, eye pain, redness, hearing loss, ear pain, congestion, sore throat, rhinorrhea, sneezing, mouth sores, trouble swallowing, neck pain, neck stiffness and tinnitus.   Respiratory: Denies SOB, DOE, cough, chest tightness,  and wheezing.   Cardiovascular: Denies chest pain, palpitations and leg swelling.  Gastrointestinal: Denies nausea, vomiting, abdominal pain, diarrhea, constipation, blood in stool and abdominal distention.  Genitourinary: Denies dysuria, urgency, frequency, hematuria, flank pain and difficulty urinating.  Musculoskeletal: Denies myalgias, back pain, joint swelling, arthralgias and gait problem.  Skin: Denies pallor, rash and wound.  Neurological: Denies  dizziness, seizures, syncope, weakness, light-headedness, numbness and headaches.  Hematological: Denies adenopathy. Easy bruising, personal or family bleeding history  Psychiatric/Behavioral: Denies suicidal ideation, mood changes, confusion, nervousness, sleep disturbance and agitation   Physical Exam: Blood pressure 144/52, pulse 64,  temperature 97.6 F (36.4 C), temperature source Oral, resp. rate 18, height 5' (1.524 m), weight 29.877 kg (65 lb 13.9 oz), SpO2 100.00%.  General: Alert and awake, oriented x3, not in any acute distress.Very frail and cachectic but cooperative HEENT: normocephalic, atraumatic, anicteric sclera, pupils reactive to light and accommodation, EOMI, oropharynx clear CVS: S1-S2 clear, no murmur rubs or gallops Chest: clear to auscultation bilaterally, no wheezing, rales or rhonchi, Dressing intact over the pacemaker site  Abdomen: soft nontender, nondistended, normal bowel sounds, no organomegaly Extremities: no cyanosis, clubbing or edema noted bilaterally Neuro: Cranial nerves II-XII intact, no focal neurological deficits Psych: alert and oriented,  bit anxious otherwise no agitation  Skin: no rashes or lesions  Labs on Admission:  Basic Metabolic Panel:  Recent Labs Lab 11/14/13 0450 11/15/13 0440  NA 141 141  K 5.2 4.9  CL 105 106  CO2 19 22  GLUCOSE 131* 137*  BUN 58* 47*  CREATININE 1.35* 1.03  CALCIUM 8.6 8.4   Liver Function Tests:  Recent Labs Lab 11/13/13 1005  AST 34  ALT 38*  ALKPHOS 108  BILITOT 0.3  PROT 7.0  ALBUMIN 3.3*   No results found for this basename: LIPASE, AMYLASE,  in the last 168 hours No results found for this basename: AMMONIA,  in the last 168 hours CBC:  Recent Labs Lab 11/13/13 1005 11/13/13 1757 11/14/13 0450  WBC 11.8* 13.1* 14.8*  NEUTROABS 9.9*  --   --   HGB 15.7* 14.2 13.4  HCT 47.2* 43.0 41.3  MCV 90.6 90.0 89.8  PLT 228 229 214   Cardiac Enzymes:  Recent Labs Lab 11/13/13 1400 11/13/13 1757 11/13/13 2345  TROPONINI 0.31* 0.64* 0.64*   BNP: No components found with this basename: POCBNP,  CBG:  Recent Labs Lab 11/14/13 1137 11/14/13 1725 11/14/13 2055 11/15/13 0754 11/15/13 1200  GLUCAP 104* 126* 106* 134* 162*    Inpatient Medications:   Scheduled Meds: . aspirin EC  81 mg Oral Daily  . calcium  gluconate  1 g Intravenous Once  . feeding supplement (RESOURCE BREEZE)  1 Container Oral BID BM  . sodium chloride  3 mL Intravenous Q12H   Continuous Infusions:    Radiological Exams on Admission: Dg Chest 1 View  11/15/2013   CLINICAL DATA:  Postop from pacemaker placement.  EXAM: CHEST - 1 VIEW  COMPARISON:  06/16/2012  FINDINGS: New dual lead transvenous pacemaker is seen in appropriate position with leads overlying the right atrium and right ventricle. No pneumothorax identified.  Pulmonary hyperinflation again seen, consistent with COPD. Both lungs are clear. No evidence of pleural effusion. Mild cardiomegaly is stable. No evidence of congestive heart failure.  IMPRESSION: New pacemaker in appropriate position. No evidence of pneumothorax or other acute findings.  Stable COPD and mild cardiomegaly.   Electronically Signed   By: Earle Gell M.D.   On: 11/15/2013 12:20    Impression/Recommendations Active Problems:   HYPERLIPIDEMIA   Tobacco abuse   CAD (coronary artery disease)   DM (diabetes mellitus)   Carotid stenosis   Hyperkalemia   Elevated troponin   Nausea   Weight loss   AV block   Acute renal failure   Complete heart block   AV block, 2nd  degree  Mild agitation with possible altered mental status - With my complete examination and evaluation of the patient, she is alert and oriented x3. She was somewhat uncomfortable in the hospital bed and anxious to be released home. I reviewed her prior office visits with her PCP, Dr. Sherren Mocha, she continues to smoke and had been losing weight. Her weight was down to 66 pounds last month. Patient does endorse loss of appetite but then she also states that she eats 3 meals per day.  - UA does not show any UTI, chest x-ray does not show any mass or pneumonia. - CT head from 8/ 2013 showed mild diffuse cortical atrophic but no mass effect, hemorrhage or acute infarction. It is possible that patient has mild underlying dementia and which  which was worsened in the hospitalized setting with recent stress and surgery, lack of sleep, anxiety. - She was started on Remeron antidepressant by Dr.Todd last month, which I have restarted.  - If it is okay from cardiac standpoint, I have started her on Megace 40 mg daily for appetite stimulation - Recommend nutritional consult, (I have ordered) to add nutritional supplements of her choice - I strongly recommended her to quit smoking, although I doubt her compliance.  - I will check TSH, B12, folate for reversible causes of dementia.   - I called both phone numbers for patient's daughter, Baker Janus but unfortunately was unable to reach her.  - She she will benefit from outpatient referral to a neuro-psychologist.   I will followup again in the morning. Please contact me if I can be of assistance in the meanwhile. Thank you for this consultation.  Time Spent on Admission: 1 hour  Yachet Mattson M.D. [facility] 11/15/2013, 1:37 PM  Addendum I was able to make contact with the patient's daughter, Baker Janus who is able to provide significant history. She did mention that patient has been with getting very paranoid lately in the last few months, has been worsened in the hospital setting. She is eating less and thinks "people are putting stuff in her food".  - Patient has been a chronic smoker and could have nicotine withdrawal, I placed her on nicotine patch  - I have placed her on low-dose oral Ativan 0.5 mg q8hrs PRN. Will benefit from psychiatry consult for her paranoia and depression.  - Also patient's daughter related that she has not been sleeping well, she was also placed on Ambien at bedtime by her PCP, which I have restarted.  - We talked about possibility of repeating her CT head to rule out any underlying CVA or worsening atrophia/dementia or any intracranial abnormality. However at this time, patient's daughter, Baker Janus does not want to pursue it. - We also talked about loss of  appetite/weight loss, chronic smoking, history of ovarian cancer, and possibility of any underlying malignancy, patient's daughter however does not want to pursue any work-up at this time. - She is interested in having more assistance in home care, I placed a case management consult for assisting the patient and her family with home health needs and home care agencies.   Toshua Honsinger M.D. [facility] 11/15/2013, 2:33 PM  Pager: [phone]

## 2013-11-15 NOTE — Progress Notes (Signed)
Orthopedic Tech Progress Note Patient Details:  [name] 1925-01-17 [id number]  Patient ID: [name], female   DOB: August 01, 1925, 80 y.o.   MRN: [id number] Confirmed pt has arm sling.  Glena Pharris T 11/15/2013, 1:38 PM

## 2013-11-16 LAB — GLUCOSE, CAPILLARY: GLUCOSE-CAPILLARY: 246 mg/dL — AB (ref 70–99)

## 2013-11-16 MED ORDER — NICOTINE 21 MG/24HR TD PT24: 21.0000 mg | MEDICATED_PATCH | Freq: Every day | TRANSDERMAL | Status: AC

## 2013-11-16 MED ORDER — METOPROLOL TARTRATE 12.5 MG HALF TABLET
12.5000 mg | ORAL_TABLET | Freq: Two times a day (BID) | ORAL | Status: DC
  Filled 2013-11-16 (×3): qty 1

## 2013-11-16 MED ORDER — METOPROLOL TARTRATE 12.5 MG HALF TABLET: 12.5000 mg | ORAL_TABLET | Freq: Two times a day (BID) | ORAL | Status: AC

## 2013-11-16 MED ORDER — LORAZEPAM 0.5 MG PO TABS: 0.5000 mg | ORAL_TABLET | Freq: Three times a day (TID) | ORAL | Status: DC | PRN

## 2013-11-16 MED ORDER — MEGESTROL ACETATE 40 MG PO TABS: 40.0000 mg | ORAL_TABLET | Freq: Every day | ORAL | Status: AC

## 2013-11-16 NOTE — Progress Notes (Signed)
   CARE MANAGEMENT NOTE 11/16/2013  Patient:  [name], [name]   Account Number:  [id number]  Date Initiated:  11/13/2013  Documentation initiated by:  Elissa Hefty  Subjective/Objective Assessment:   adm w bradycardia     Action/Plan:   lives alone, supp da   Anticipated DC Date:  11/17/2013   Anticipated DC Plan:  [place]  In-house referral  Clinical Social Worker      DC Forensic scientist  CM consult      [facility] Choice  HOME HEALTH   Choice offered to / List presented to:  C-4 Adult Children           [place].   Status of service:  Completed, signed off Medicare Important Message given?   (If response is "NO", the following Medicare IM given date fields will be blank) Date Medicare IM given:   Date Additional Medicare IM given:    Discharge Disposition:  [place]  Per UR Regulation:  Reviewed for med. necessity/level of care/duration of stay  If discussed at [place] of Stay Meetings, dates discussed:    Comments:  12:55 CM received call from RN stating family has changed their mind and pt will be going home with son, [name].  CM spoke with [name] on the phone who states he can provide 24 hour supervision and will be living with pt.  CM offered choice and [name] states [facility] will be fine to render [facility] services to pt.  RN to get [facility] orders from MD.  Referral faxed to [facility] with orders for HHPT/RN/aide.  Address and contact number verified with [name].  No other CM needs were communicated.  [name], BSN, CM [phone].  11:00 CSW notified of MD progress note stating SNF in the next 1-2 days. No other CM needs were communicated.  [name], BSN, [place].  11/16/13 1/15 1351 Sara dowell rn,bsn per old records hx of [facility].

## 2013-11-16 NOTE — Progress Notes (Signed)
   CARE MANAGEMENT NOTE 11/16/2013  Patient:  [name], [name]   Account Number:  [id number]  Date Initiated:  11/13/2013  Documentation initiated by:  Elissa Hefty  Subjective/Objective Assessment:   adm w bradycardia     Action/Plan:   lives alone, supp da   Anticipated DC Date:  11/17/2013   Anticipated DC Plan:  SKILLED NURSING FACILITY  In-house referral  Clinical Social Worker      DC Planning Services  CM consult      Choice offered to / List presented to:             Status of service:  Completed, signed off Medicare Important Message given?   (If response is "NO", the following Medicare IM given date fields will be blank) Date Medicare IM given:   Date Additional Medicare IM given:    Discharge Disposition:  [place]  Per UR Regulation:  Reviewed for med. necessity/level of care/duration of stay  If discussed at [place] Length of Stay Meetings, dates discussed:    Comments:  11:00 CSW notified of MD progress note stating SNF in the next 1-2 days. No other CM needs were communicated.  Mariane Masters, BSN, [place].  11/16/13 1/15 1351 debbie dowell rn,bsn per old records hx of [facility].

## 2013-11-16 NOTE — Discharge Summary (Addendum)
Physician Discharge Summary  Patient ID: [name] MRN: [id number] DOB/AGE: 1925/07/26 80 y.o.  Admit date: 11/13/2013 Discharge date: 11/16/2013  Primary Discharge Diagnosis: 1. Complete Heart Block 2. S/P  Medtronic Adapta L Model ADDR: 1  Secondary Discharge Diagnosis: 1. CAD  2. Diabetes 3. Carotid Artery Stenosis   Significant Diagnostic Studies/Porcedures Pacemaker Implantation 11/14/2013 RA/RV Lead Placement:  The left axillary vein was cannulated. Through the left axillary vein, a Medtronic model [id number] (serial number [id number]) right atrial lead and a Medtronic model [id number] (serial number [id number]) right ventricular lead were advanced with fluoroscopic visualization into the right atrial appendage and right ventricular apex positions respectively. Initial atrial lead P- waves measured 3.5 mV with impedance of 747 ohms and a threshold of 1.3 V at 0.5 msec. Right ventricular lead R-waves measured 25 mV with an impedance of 630 ohms and a threshold of 0.5 V at 0.5 msec. Both leads were secured to the pectoralis fascia using #2-0 silk over the suture sleeves.  Device Placement:  The leads were then connected to a Medtronic Adapta L model ADDRL 1 (serial number [id number]) pacemaker. The pocket was irrigated with copious gentamicin solution. The pacemaker was then placed into the pocket. The pocket was then closed in 2 layers with 2.0 Vicryl suture for the subcutaneous and subcuticular layers. Steri- Strips and a sterile dressing were then applied. There were no early apparent complications.    Hospital Course: This is a patient of Dr. Rosezella Florida that was admitting when bradycardia and unresponsiveness. The patient's in complete heart block with heart rate 20s. Patient history depression diabetes hyperlipidemia hypertension at baseline. Her daughter says that she has had very poor appetite and losing weight for at least several days. Last night the patient was  confused. She's had nausea intermittently. She was brought to the emergency room today. She has high degree AV block with 3:1 block. Her blood pressure is stable. She is having persistent nausea. Potassium was obtained with a value of 5.6. She received one amp of calcium and there was no change in her heart rhythm. On the initial 12-lead EKG the complex was different from her underlying right bundle branch block. Her first troponin is 0.22. She has not been having significant chest pain. She has a known ejection fraction of 45%. Last echo was 2013. She has known coronary disease. Last catheterization was 2005. Last Myoview scan was 2012. She had inferior scar. There was question of some mild peri-infarct ischemia.  Patient underwent emergency percutaneous temporary pacemaker placement by Dr. Ellyn Hack. Underwent cardiac catheterization showing normal left main. Stent in the LAD was patent and the mid LAD ad a 50-60% lesion. disease and on diffuse 60% stenosis in an area between her OM 1 an OM 2 with more significant lesion in a small ramus-type marginal branch and circumflex.The right coronary artery stent was patent. No significant lesions in the right coronary were seen to explain high-grade AV block.     ICU electrophysiologist Dr. Thompson Grayer was consulted for  evaluation and need for pacemaker implantation.Medtronic Adapta L model ADDRL 1 (serial number [id number]) pacemaker was inserted 116 2 15 Allred. Patient tolerated the procedure were had no complications leaking or infection.   Unfortunately her 2000 the patient was noted to have some mental status. She lives aware that she had had a pacemaker placed. Was restarted at, antidepressant, which was restarted during hospitalization. Daily family history dementia which was worsening during hospitalization in the  setting of recent stress surgery] anxiety. Was a synechia or internist and physician are sent reveals that the patient had not been sleeping  well, and had episodes of paranoia which seemed to have worsened recently. The patient each time the patient start her in therapy to proceed any further workup.    Social Services this patient where she was initially for skilled nursing home placement, howeverthe family wished to bring her home where her son will care for her. They had initiated home health nurses for therapy, RN visits, nurses and nurses aid.   She was seen and examined by Dr. Wynonia Lawman on day of discharge. She was found to be stable for discharge. Follow up appt will be made for wound check and EP follow-up by our office.      Discharge Exam: Blood pressure 137/73, pulse 62, temperature 97.8 F (36.6 C), temperature source Oral, resp. rate 18, height 5' (1.524 m), weight 65 lb 13.9 oz (29.877 kg), SpO2 97.00%.   Labs:   Lab Results  Component Value Date   WBC 14.8* 11/14/2013   HGB 13.4 11/14/2013   HCT 41.3 11/14/2013   MCV 89.8 11/14/2013   PLT 214 11/14/2013    Recent Labs Lab 11/13/13 1005  11/15/13 0440  NA 140  < > 141  K 6.4*  < > 4.9  CL 96  < > 106  CO2 24  < > 22  BUN 67*  < > 47*  CREATININE 1.48*  < > 1.03  CALCIUM 9.2  < > 8.4  PROT 7.0  --   --   BILITOT 0.3  --   --   ALKPHOS 108  --   --   ALT 38*  --   --   AST 34  --   --   GLUCOSE 155*  < > 137*  < > = values in this interval not displayed. Lab Results  Component Value Date   CKTOTAL 47 05/30/2009   CKMB 2.4 05/30/2009   TROPONINI 0.64* 11/13/2013      Lab Results  Component Value Date   LDLCALC 105* 11/14/2013       Radiology: Dg Chest 1 View  11/15/2013   CLINICAL DATA:  Postop from pacemaker placement.  EXAM: CHEST - 1 VIEW  COMPARISON:  06/16/2012  FINDINGS: New dual lead transvenous pacemaker is seen in appropriate position with leads overlying the right atrium and right ventricle. No pneumothorax identified.  Pulmonary hyperinflation again seen, consistent with COPD. Both lungs are clear. No evidence of pleural effusion. Mild  cardiomegaly is stable. No evidence of congestive heart failure.  IMPRESSION: New pacemaker in appropriate position. No evidence of pneumothorax or other acute findings.  Stable COPD and mild cardiomegaly.   Electronically Signed   By: Earle Gell M.D.   On: 11/15/2013 12:20    FOLLOW UP PLANS AND APPOINTMENTS     Discharge Orders   Future Appointments Provider Department Dept Phone   11/24/2013 3:00 PM [facility] Office [phone]   Future Orders Complete By Expires   Ambulatory referral to [facility]  As directed    Scheduling Instructions:     Physical therapy   Comments:     I Jory Sims certify that this patient is under my care and that I, or a nurse practitioner or physician's assistant working with me, had a face-to-face encounter that meets the physician face-to-face encounter requirements with this patient on 11/16/2013. The encounter with the patient  was in whole, or in part for the following medical condition(s) which is the primary reason for home health care (List medical condition):         I Jory Sims certify that this patient is under my care and that I, or a nurse practitioner or physician's assistant working with me, had a face-to-face encounter that meets the physician face-to-face encounter requirements with this patient on 11/16/2013. The encounter with the patient was in whole, or in part for the following medical condition(s) which is the primary reason for home health care (List medical condition):        Nursing visits 3 times a week for 6 weeks.  Please evaluate Martie Lee for admission to [facility] for RN, PT and Home health aid. Physician to follow patient's care (the person listed here will be responsible for signing ongoing orders):   Diet - low sodium heart healthy  As directed    Discharge instructions  As directed    Comments:     Supplemental Discharge Instructions for    Pacemaker/Defibrillator Patients  Activity No heavy lifting or vigorous activity with your left/right arm for 6 to 8 weeks.  Do not raise your left/right arm above your head for one week.  Gradually raise your affected arm as drawn below.  __  NO DRIVING for     ; you may begin driving on     .  WOUND CARE Keep the wound area clean and dry.  Do not get this area wet for one week. No showers for one week; you may shower on     . The tape/steri-strips on your wound will fall off; do not pull them off.  No bandage is needed on the site.  DO  NOT apply any creams, oils, or ointments to the wound area. If you notice any drainage or discharge from the wound, any swelling or bruising at the site, or you develop a fever > 101? F after you are discharged home, call the office at once.  Special Instructions You are still able to use cellular telephones; use the ear opposite the side where you have your pacemaker/defibrillator.  Avoid carrying your cellular phone near your device. When traveling through airports, show security personnel your identification card to avoid being screened in the metal detectors.  Ask the security personnel to use the hand wand. Avoid arc welding equipment, MRI testing (magnetic resonance imaging), TENS units (transcutaneous nerve stimulators).  Call the office for questions about other devices. Avoid electrical appliances that are in poor condition or are not properly grounded. Microwave ovens are safe to be near or to operate.  Additional information for defibrillator patients should your device go off: If your device goes off ONCE and you feel fine afterward, notify the device clinic nurses. If your device goes off ONCE and you do not feel well afterward, call 911. If your device goes off TWICE, call 911. If your device goes off THREE times in one day, call 911.  DO NOT DRIVE YOURSELF OR A FAMILY MEMBER WITH A DEFIBRILLATOR TO THE HOSPITAL-CALL 911.   Discharge  wound care:  As directed    Increase activity slowly  As directed        Medication List         aspirin 81 MG tablet  Take 81 mg by mouth daily.     Calcium-Vitamin D 600-200 MG-UNIT Caps  Take 1 tablet by mouth every evening.     fish  oil-omega-3 fatty acids 1000 MG capsule  Take 1 g by mouth daily. 1 tab po qd     furosemide 20 MG tablet  Commonly known as:  LASIX  Take 1 tablet (20 mg total) by mouth daily.     hydrochlorothiazide 12.5 MG tablet  Commonly known as:  HYDRODIURIL  1 tablet Monday Wednesday Friday     LORazepam 0.5 MG tablet  Commonly known as:  ATIVAN  Take 1 tablet (0.5 mg total) by mouth every 8 (eight) hours as needed for anxiety.     meclizine 50 MG tablet  Commonly known as:  ANTIVERT  Take 0.5 tablets (25 mg total) by mouth 3 (three) times daily as needed.     megestrol 40 MG tablet  Commonly known as:  MEGACE  Take 1 tablet (40 mg total) by mouth daily.     metoprolol tartrate 12.5 mg Tabs tablet  Commonly known as:  LOPRESSOR  Take 0.5 tablets (12.5 mg total) by mouth 2 (two) times daily.     mirtazapine 7.5 MG tablet  Commonly known as:  REMERON  Take 7.5 mg by mouth at bedtime.     multivitamin tablet  Take 1 tablet by mouth daily.     nicotine 21 mg/24hr patch  Commonly known as:  NICODERM CQ - dosed in mg/24 hours  Place 1 patch (21 mg total) onto the skin daily.     NIFEdipine 90 MG 24 hr tablet  Commonly known as:  PROCARDIA XL/ADALAT-CC  Take 90 mg by mouth daily.     nitroGLYCERIN 0.4 MG SL tablet  Commonly known as:  NITROSTAT  Place 0.4 mg under the tongue every 5 (five) minutes as needed for chest pain.     ramipril 10 MG capsule  Commonly known as:  ALTACE  Take 10 mg by mouth at bedtime.     zolpidem 5 MG tablet  Commonly known as:  AMBIEN  Take 5 mg by mouth at bedtime as needed for sleep.           Time spent with patient to include physician time:45 minutes. Signed: Jory Sims 11/16/2013,  12:52 PM Co-Sign MD

## 2013-11-16 NOTE — Progress Notes (Signed)
Subjective:  She is sitting up beside the bed in a chair and wants to go home. She is somewhat paranoid. No shortness of breath or chest pain. Renal function has improved.  Objective:  Vital Signs in the last 24 hours: BP 137/73  Pulse 62  Temp(Src) 97.8 F (36.6 C) (Oral)  Resp 18  Ht 5' (1.524 m)  Wt 29.877 kg (65 lb 13.9 oz)  BMI 12.86 kg/m2  SpO2 97%  Physical Exam: Elderly female who is thin and mildly agitated Lungs:  Clear  Cardiac:  Regular rhythm, normal S1 and S2, no S3 Extremities:  Pacemaker has mild ecchymoses but no hematoma, clean and dry  Intake/Output from previous day: 01/17 0701 - 01/18 0700 In: 3 [I.V.:3] Out: 450 [Urine:450] Weight Filed Weights   11/13/13 1650 11/15/13 0000  Weight: 30 kg (66 lb 2.2 oz) 29.877 kg (65 lb 13.9 oz)    Lab Results: Basic Metabolic Panel:  Recent Labs  11/14/13 0450 11/15/13 0440  NA 141 141  K 5.2 4.9  CL 105 106  CO2 19 22  GLUCOSE 131* 137*  BUN 58* 47*  CREATININE 1.35* 1.03    CBC:  Recent Labs  11/13/13 1757 11/14/13 0450  WBC 13.1* 14.8*  HGB 14.2 13.4  HCT 43.0 41.3  MCV 90.0 89.8  PLT 229 214    BNP    Component Value Date/Time   PROBNP 944.8* 06/26/2013 1838    Telemetry: Paced rhythm  Assessment/Plan:  #1. Complete heart block with previous pacemaker 2. Coronary artery disease  3. Significant confusion  Recommendations:  Her son is in the room today and wishes to take his mother home. The patient wishes to go home. He thinks that her paranoia will be better in a home environment. I think this is fine but they should have an early followup with her primary care physician and an early followup for a wound check.Kerry Hough  MD [facility]  11/16/2013, 11:25 AM

## 2013-11-16 NOTE — Progress Notes (Signed)
Patient ID: [name]  female  HQI:[id number]    DOB: May 25, 1925    DOA: 11/13/2013  PCP: Joycelyn Man, MD  Assessment/Plan: Active Problems:   HYPERLIPIDEMIA   Tobacco abuse   CAD (coronary artery disease)   DM (diabetes mellitus)   Carotid stenosis   Hyperkalemia   Elevated troponin   Nausea   Weight loss   AV block   Acute renal failure   Complete heart block   AV block, 2nd degree  Confusion with paranoid behavior, depression, early dementia/sundowning - Patient continues to be confused, did not sleep well at night. Per the RN, she had refused and spitted out her medications. -Continue Remeron at bedtime, ambien qhs PRN for sleep, Megace for appetite stimulation.  - The patient's family does not want to proceed with any further imagings.  - I discussed in detail with patient's son, who understands the patient is getting worse with her sundowning in the hospitalized setting and wishes her to be dc'ed. I recommended to followup with primary care physician or geriatrician psychologist/psychiatrist outpatient for further recommendations regarding her paranoia/depression -  I recommended RN and patient's son to discuss with cardiology/primary service for disposition. He is interested in home health aide, Home health PT and RN. I had placed case management consult yesterday.  - I will sign off. Please call me if you have any qns.  DVT Prophylaxis:  Code Status:  Family Communication: Discussed in detail with patient's son  Disposition:Per cardiology, primary team    Subjective: Patient continues to be confused, son at the bedside.  Objective: Weight change:   Intake/Output Summary (Last 24 hours) at 11/16/13 1205 Last data filed at 11/16/13 0800  Gross per 24 hour  Intake    240 ml  Output    450 ml  Net   -210 ml   Blood pressure 137/73, pulse 62, temperature 97.8 F (36.6 C), temperature source Oral, resp. rate 18, height 5' (1.524 m), weight 29.877 kg  (65 lb 13.9 oz), SpO2 97.00%.  Physical Exam: General: Alert and awake,  confused and paranoid  CVS: S1-S2 clear, no murmur rubs or gallops Chest: clear to auscultation bilaterally, no wheezing, rales or rhonchiDressing on the pacemaker site  Abdomen: soft nontender, nondistended, normal bowel sounds  Extremities: no cyanosis, clubbing or edema noted bilaterally   Lab Results: Basic Metabolic Panel:  Recent Labs Lab 11/14/13 0450 11/15/13 0440  NA 141 141  K 5.2 4.9  CL 105 106  CO2 19 22  GLUCOSE 131* 137*  BUN 58* 47*  CREATININE 1.35* 1.03  CALCIUM 8.6 8.4   Liver Function Tests:  Recent Labs Lab 11/13/13 1005  AST 34  ALT 38*  ALKPHOS 108  BILITOT 0.3  PROT 7.0  ALBUMIN 3.3*   No results found for this basename: LIPASE, AMYLASE,  in the last 168 hours No results found for this basename: AMMONIA,  in the last 168 hours CBC:  Recent Labs Lab 11/13/13 1005 11/13/13 1757 11/14/13 0450  WBC 11.8* 13.1* 14.8*  NEUTROABS 9.9*  --   --   HGB 15.7* 14.2 13.4  HCT 47.2* 43.0 41.3  MCV 90.6 90.0 89.8  PLT 228 229 214   Cardiac Enzymes:  Recent Labs Lab 11/13/13 1400 11/13/13 1757 11/13/13 2345  TROPONINI 0.31* 0.64* 0.64*   BNP: No components found with this basename: POCBNP,  CBG:  Recent Labs Lab 11/14/13 1725 11/14/13 2055 11/15/13 0754 11/15/13 1200 11/15/13 1659  GLUCAP 126* 106* 134* 162* 161*  Micro Results: Recent Results (from the past 240 hour(s))  MRSA PCR SCREENING     Status: None   Collection Time    11/13/13  1:25 PM      Result Value Range Status   MRSA by PCR NEGATIVE  NEGATIVE Final   Comment:            The GeneXpert MRSA Assay (FDA     approved for NASAL specimens     only), is one component of a     comprehensive MRSA colonization     surveillance program. It is not     intended to diagnose MRSA     infection nor to guide or     monitor treatment for     MRSA infections.    Studies/Results: Dg Chest 1  View  11/15/2013   CLINICAL DATA:  Postop from pacemaker placement.  EXAM: CHEST - 1 VIEW  COMPARISON:  06/16/2012  FINDINGS: New dual lead transvenous pacemaker is seen in appropriate position with leads overlying the right atrium and right ventricle. No pneumothorax identified.  Pulmonary hyperinflation again seen, consistent with COPD. Both lungs are clear. No evidence of pleural effusion. Mild cardiomegaly is stable. No evidence of congestive heart failure.  IMPRESSION: New pacemaker in appropriate position. No evidence of pneumothorax or other acute findings.  Stable COPD and mild cardiomegaly.   Electronically Signed   By: Earle Gell M.D.   On: 11/15/2013 12:20    Medications: Scheduled Meds: . aspirin EC  81 mg Oral Daily  . calcium gluconate  1 g Intravenous Once  . feeding supplement (RESOURCE BREEZE)  1 Container Oral BID BM  . megestrol  40 mg Oral Daily  . metoprolol tartrate  12.5 mg Oral BID  . mirtazapine  7.5 mg Oral QHS  . nicotine  21 mg Transdermal Daily  . sodium chloride  3 mL Intravenous Q12H  . zolpidem  5 mg Oral QHS      LOS: 3 days   Rondia Higginbotham M.D. [facility] 11/16/2013, 12:05 PM Pager: [phone]  If 7PM-7AM, please contact night-coverage [email or web address] Password [email or web address]
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
## 2013-11-17 ENCOUNTER — Telehealth: Payer: Self-pay | Admitting: Adult Health

## 2013-11-17 NOTE — Telephone Encounter (Signed)
Received fax refill request  Rx # [id number] Medication:  Megestrol 40 mg tablet Qty 30 Sig:  Take one tablet by mouth every day Physician:  Purcell Nails   **Prior Authorization Required / please see paper in refill bin / tgs**

## 2013-11-17 NOTE — Telephone Encounter (Signed)
Refill for megace re-routed to pcp (D.Todd) to fill

## 2013-11-18 ENCOUNTER — Telehealth: Payer: Self-pay

## 2013-11-18 NOTE — Telephone Encounter (Signed)
Pharmacy called stating that pt is requesting an early refill of her zolpidem because she has lost hers. It was last filled 10/31/13 for a 30 day supply.

## 2013-11-18 NOTE — Telephone Encounter (Signed)
Per Dr Sherren Mocha patient should go to behavior health

## 2013-11-18 NOTE — Telephone Encounter (Signed)
Left message on machine for patient to return our call 

## 2013-11-18 NOTE — Telephone Encounter (Signed)
Spoke with pharmacist to deny early refill

## 2013-11-20 ENCOUNTER — Telehealth: Payer: Self-pay | Admitting: Internal Medicine

## 2013-11-20 NOTE — Telephone Encounter (Signed)
Spoke with daughter and went over all her medications

## 2013-11-20 NOTE — Telephone Encounter (Signed)
New message     Had pacemaker put in last week.   Daughter want to go over her medications with a nurse

## 2013-11-21 ENCOUNTER — Telehealth: Payer: Self-pay | Admitting: Adult Health

## 2013-11-21 ENCOUNTER — Telehealth: Payer: Self-pay | Admitting: Family Medicine

## 2013-11-21 ENCOUNTER — Emergency Department (HOSPITAL_COMMUNITY): Payer: Medicare Other

## 2013-11-21 ENCOUNTER — Encounter (HOSPITAL_COMMUNITY): Payer: Self-pay | Admitting: Emergency Medicine

## 2013-11-21 ENCOUNTER — Inpatient Hospital Stay (HOSPITAL_COMMUNITY)
Admission: EM | Admit: 2013-11-21 | Discharge: 2013-11-27 | DRG: 280 | Disposition: A | Discharge status: Skilled Nursing Facility | Payer: Medicare Other | Attending: Internal Medicine | Admitting: Internal Medicine

## 2013-11-21 ENCOUNTER — Telehealth: Payer: Self-pay | Admitting: Cardiology

## 2013-11-21 DIAGNOSIS — E43 Unspecified severe protein-calorie malnutrition: Secondary | ICD-10-CM

## 2013-11-21 DIAGNOSIS — I509 Heart failure, unspecified: Secondary | ICD-10-CM | POA: Diagnosis present

## 2013-11-21 DIAGNOSIS — F29 Unspecified psychosis not due to a substance or known physiological condition: Secondary | ICD-10-CM | POA: Diagnosis not present

## 2013-11-21 DIAGNOSIS — C569 Malignant neoplasm of unspecified ovary: Secondary | ICD-10-CM

## 2013-11-21 DIAGNOSIS — Z950 Presence of cardiac pacemaker: Secondary | ICD-10-CM

## 2013-11-21 DIAGNOSIS — B369 Superficial mycosis, unspecified: Secondary | ICD-10-CM

## 2013-11-21 DIAGNOSIS — R7989 Other specified abnormal findings of blood chemistry: Secondary | ICD-10-CM

## 2013-11-21 DIAGNOSIS — Z681 Body mass index (BMI) 19 or less, adult: Secondary | ICD-10-CM

## 2013-11-21 DIAGNOSIS — E119 Type 2 diabetes mellitus without complications: Secondary | ICD-10-CM

## 2013-11-21 DIAGNOSIS — R778 Other specified abnormalities of plasma proteins: Secondary | ICD-10-CM

## 2013-11-21 DIAGNOSIS — J449 Chronic obstructive pulmonary disease, unspecified: Secondary | ICD-10-CM | POA: Diagnosis present

## 2013-11-21 DIAGNOSIS — Z720 Tobacco use: Secondary | ICD-10-CM

## 2013-11-21 DIAGNOSIS — F3289 Other specified depressive episodes: Secondary | ICD-10-CM | POA: Diagnosis present

## 2013-11-21 DIAGNOSIS — F329 Major depressive disorder, single episode, unspecified: Secondary | ICD-10-CM | POA: Diagnosis present

## 2013-11-21 DIAGNOSIS — R531 Weakness: Secondary | ICD-10-CM

## 2013-11-21 DIAGNOSIS — I442 Atrioventricular block, complete: Secondary | ICD-10-CM

## 2013-11-21 DIAGNOSIS — R0902 Hypoxemia: Secondary | ICD-10-CM

## 2013-11-21 DIAGNOSIS — J4489 Other specified chronic obstructive pulmonary disease: Secondary | ICD-10-CM | POA: Diagnosis present

## 2013-11-21 DIAGNOSIS — F17200 Nicotine dependence, unspecified, uncomplicated: Secondary | ICD-10-CM | POA: Diagnosis present

## 2013-11-21 DIAGNOSIS — I6529 Occlusion and stenosis of unspecified carotid artery: Secondary | ICD-10-CM

## 2013-11-21 DIAGNOSIS — Z79899 Other long term (current) drug therapy: Secondary | ICD-10-CM

## 2013-11-21 DIAGNOSIS — N179 Acute kidney failure, unspecified: Secondary | ICD-10-CM

## 2013-11-21 DIAGNOSIS — I441 Atrioventricular block, second degree: Secondary | ICD-10-CM

## 2013-11-21 DIAGNOSIS — L989 Disorder of the skin and subcutaneous tissue, unspecified: Secondary | ICD-10-CM

## 2013-11-21 DIAGNOSIS — Z9181 History of falling: Secondary | ICD-10-CM

## 2013-11-21 DIAGNOSIS — I2510 Atherosclerotic heart disease of native coronary artery without angina pectoris: Secondary | ICD-10-CM

## 2013-11-21 DIAGNOSIS — Z8543 Personal history of malignant neoplasm of ovary: Secondary | ICD-10-CM

## 2013-11-21 DIAGNOSIS — I214 Non-ST elevation (NSTEMI) myocardial infarction: Principal | ICD-10-CM | POA: Diagnosis present

## 2013-11-21 DIAGNOSIS — I4430 Unspecified atrioventricular block: Secondary | ICD-10-CM

## 2013-11-21 DIAGNOSIS — Z9861 Coronary angioplasty status: Secondary | ICD-10-CM

## 2013-11-21 DIAGNOSIS — H8110 Benign paroxysmal vertigo, unspecified ear: Secondary | ICD-10-CM

## 2013-11-21 DIAGNOSIS — E785 Hyperlipidemia, unspecified: Secondary | ICD-10-CM

## 2013-11-21 DIAGNOSIS — E875 Hyperkalemia: Secondary | ICD-10-CM

## 2013-11-21 DIAGNOSIS — J9600 Acute respiratory failure, unspecified whether with hypoxia or hypercapnia: Secondary | ICD-10-CM | POA: Diagnosis present

## 2013-11-21 DIAGNOSIS — J9690 Respiratory failure, unspecified, unspecified whether with hypoxia or hypercapnia: Secondary | ICD-10-CM

## 2013-11-21 DIAGNOSIS — R634 Abnormal weight loss: Secondary | ICD-10-CM

## 2013-11-21 DIAGNOSIS — Z7982 Long term (current) use of aspirin: Secondary | ICD-10-CM

## 2013-11-21 DIAGNOSIS — I10 Essential (primary) hypertension: Secondary | ICD-10-CM

## 2013-11-21 DIAGNOSIS — Z66 Do not resuscitate: Secondary | ICD-10-CM | POA: Diagnosis present

## 2013-11-21 DIAGNOSIS — Z515 Encounter for palliative care: Secondary | ICD-10-CM

## 2013-11-21 DIAGNOSIS — R110 Nausea: Secondary | ICD-10-CM

## 2013-11-21 LAB — CBC WITH DIFFERENTIAL/PLATELET
BASOS ABS: 0 10*3/uL (ref 0.0–0.1)
Basophils Relative: 0 % (ref 0–1)
EOS PCT: 0 % (ref 0–5)
Eosinophils Absolute: 0 10*3/uL (ref 0.0–0.7)
HEMATOCRIT: 44 % (ref 36.0–46.0)
Hemoglobin: 14.8 g/dL (ref 12.0–15.0)
Lymphocytes Relative: 10 % — ABNORMAL LOW (ref 12–46)
Lymphs Abs: 1 10*3/uL (ref 0.7–4.0)
MCH: 29.6 pg (ref 26.0–34.0)
MCHC: 33.6 g/dL (ref 30.0–36.0)
MCV: 88 fL (ref 78.0–100.0)
MONO ABS: 0.8 10*3/uL (ref 0.1–1.0)
Monocytes Relative: 8 % (ref 3–12)
Neutro Abs: 8.8 10*3/uL — ABNORMAL HIGH (ref 1.7–7.7)
Neutrophils Relative %: 82 % — ABNORMAL HIGH (ref 43–77)
Platelets: 185 10*3/uL (ref 150–400)
RBC: 5 MIL/uL (ref 3.87–5.11)
RDW: 16.6 % — ABNORMAL HIGH (ref 11.5–15.5)
WBC: 10.7 10*3/uL — AB (ref 4.0–10.5)

## 2013-11-21 LAB — COMPREHENSIVE METABOLIC PANEL
ALBUMIN: 2.7 g/dL — AB (ref 3.5–5.2)
ALK PHOS: 95 U/L (ref 39–117)
ALT: 91 U/L — ABNORMAL HIGH (ref 0–35)
AST: 130 U/L — ABNORMAL HIGH (ref 0–37)
BUN: 55 mg/dL — AB (ref 6–23)
CO2: 18 mEq/L — ABNORMAL LOW (ref 19–32)
Calcium: 8.5 mg/dL (ref 8.4–10.5)
Chloride: 102 mEq/L (ref 96–112)
Creatinine, Ser: 1.48 mg/dL — ABNORMAL HIGH (ref 0.50–1.10)
GFR calc Af Amer: 36 mL/min — ABNORMAL LOW (ref >90)
GFR calc non Af Amer: 31 mL/min — ABNORMAL LOW (ref >90)
Glucose, Bld: 166 mg/dL — ABNORMAL HIGH (ref 70–99)
Potassium: 5.7 mEq/L — ABNORMAL HIGH (ref 3.7–5.3)
Sodium: 142 mEq/L (ref 137–147)
TOTAL PROTEIN: 6.1 g/dL (ref 6.0–8.3)
Total Bilirubin: 0.4 mg/dL (ref 0.3–1.2)

## 2013-11-21 LAB — POCT I-STAT TROPONIN I: Troponin i, poc: 2.65 ng/mL (ref 0.00–0.08)

## 2013-11-21 LAB — CG4 I-STAT (LACTIC ACID): Lactic Acid, Venous: 3.8 mmol/L — ABNORMAL HIGH (ref 0.5–2.2)

## 2013-11-21 MED ORDER — PIPERACILLIN-TAZOBACTAM 3.375 G IVPB 30 MIN
3.3750 g | Freq: Once | INTRAVENOUS | Status: AC
  Administered 2013-11-22: 3.375 g via INTRAVENOUS
  Filled 2013-11-21: qty 50

## 2013-11-21 MED ORDER — ONDANSETRON HCL 4 MG/2ML IJ SOLN
4.0000 mg | Freq: Once | INTRAMUSCULAR | Status: AC
  Administered 2013-11-21: 4 mg via INTRAVENOUS

## 2013-11-21 MED ORDER — VANCOMYCIN HCL 500 MG IV SOLR
500.0000 mg | Freq: Once | INTRAVENOUS | Status: AC
  Administered 2013-11-21: 500 mg via INTRAVENOUS
  Filled 2013-11-21: qty 500

## 2013-11-21 MED ORDER — ONDANSETRON HCL 4 MG/2ML IJ SOLN
INTRAMUSCULAR | Status: AC
  Filled 2013-11-21: qty 2

## 2013-11-21 NOTE — ED Notes (Signed)
RT at [facility] attempting abg, unsuccessful 1st stick, Dr. Zenia Resides speaking with family, pt remains DNR, Dr. Tawnya Crook in to see pt.

## 2013-11-21 NOTE — ED Notes (Signed)
Unable to get abg, unable to obtain blood from IV start, poor venous flow with venipuncture. Pt remains calm, NAD, interactive, VSS.

## 2013-11-21 NOTE — Progress Notes (Signed)
RT unable to obtain ABG. MD notified.

## 2013-11-21 NOTE — Telephone Encounter (Signed)
New Problem:  [name] from Hospice states the daughter has requested hospice services and now hospice is asking if Dr. Percival Spanish will be the attending physician. [name] states if Dr. Percival Spanish agrees then hospice would need records and an order. Below is the fax number for [facility].    [phone] fax

## 2013-11-21 NOTE — ED Notes (Addendum)
Dr Tawnya Crook given a copy of lactic acid results 3.80 and tropoinin results 2.65

## 2013-11-21 NOTE — Telephone Encounter (Signed)
Noted medication is not managed by cardiology and needs to be addressed to PCP, faxed back to pharmacy with notation to advise

## 2013-11-21 NOTE — ED Notes (Signed)
Pt c/o nausea and eminent vomiting, bipap removed, Dr. Zenia Resides at [facility], zofran given, RT present, pt alert, NAD, calm, interactive.

## 2013-11-21 NOTE — ED Notes (Signed)
Dr. Zenia Resides at [facility] upon pt arrival, pt lethargic, prefers eyes closed, interactive, NAD, calm, tachypneic, answering questions appropriately, some confusion, follows commands. Here by EMS from home. Pacemaker placed 6d ago. Son called EMS for "won't eat", FTT and sob. intermittant poor signal on SPO2, intermittant 100% on 15L NRB. NSL 20g in L FA. V-paced HR 64. Som pitting edema in BLE. Skin cold and dry.last code status per EPIC DNR on 11/14/2013.

## 2013-11-21 NOTE — Telephone Encounter (Signed)
Please see paper in refill bin / tgs  °

## 2013-11-21 NOTE — ED Notes (Signed)
Attempted to find family (son), unable to find, not in fammily conference rooms x3 or w/r. Pt remains on Bipap, moved to room B16, no changes, arousable to voice, NAD, calm, VSS, poor SPO2 signal. Denies pain or nausea. "feels cold", given warm blanket and pillow.

## 2013-11-21 NOTE — ED Notes (Signed)
Xray at [facility] 

## 2013-11-21 NOTE — Telephone Encounter (Signed)
Heather from [facility] would like a call back regarding pt.  Pt was found on the floor by son 3 times last night, pt has pace maker, increased potassium 5.6 in hospital.  Nira Conn states she can go see pt today but would like to know what plan of care is wanted for her to follow? Or if you want to see pt in the office?

## 2013-11-21 NOTE — ED Notes (Signed)
Nicotine 21mg  patch noted to L shoulder, dated 1/18.

## 2013-11-21 NOTE — Telephone Encounter (Signed)
Spoke to [place] told her Dr. Sherren Mocha is out of the office will be back Monday, pt needs to make an appt next week to see him. Told her looks like Cardiology is following her and spoke with pt's daughter yesterday. Told her to call Cardiology to see what care they want due to Cardiology sent referral. Nira Conn verbalized understanding.

## 2013-11-21 NOTE — Telephone Encounter (Signed)
Will forward to MD for review and orders

## 2013-11-22 DIAGNOSIS — J9600 Acute respiratory failure, unspecified whether with hypoxia or hypercapnia: Secondary | ICD-10-CM

## 2013-11-22 DIAGNOSIS — J9690 Respiratory failure, unspecified, unspecified whether with hypoxia or hypercapnia: Secondary | ICD-10-CM | POA: Diagnosis present

## 2013-11-22 DIAGNOSIS — R0902 Hypoxemia: Secondary | ICD-10-CM

## 2013-11-22 DIAGNOSIS — I2510 Atherosclerotic heart disease of native coronary artery without angina pectoris: Secondary | ICD-10-CM

## 2013-11-22 DIAGNOSIS — F17200 Nicotine dependence, unspecified, uncomplicated: Secondary | ICD-10-CM

## 2013-11-22 DIAGNOSIS — I10 Essential (primary) hypertension: Secondary | ICD-10-CM

## 2013-11-22 DIAGNOSIS — E43 Unspecified severe protein-calorie malnutrition: Secondary | ICD-10-CM | POA: Insufficient documentation

## 2013-11-22 DIAGNOSIS — E875 Hyperkalemia: Secondary | ICD-10-CM

## 2013-11-22 DIAGNOSIS — E785 Hyperlipidemia, unspecified: Secondary | ICD-10-CM

## 2013-11-22 DIAGNOSIS — E119 Type 2 diabetes mellitus without complications: Secondary | ICD-10-CM

## 2013-11-22 LAB — HEPARIN LEVEL (UNFRACTIONATED): Heparin Unfractionated: <0.1 IU/mL — ABNORMAL LOW (ref 0.30–0.70)

## 2013-11-22 LAB — BASIC METABOLIC PANEL
BUN: 59 mg/dL — AB (ref 6–23)
CALCIUM: 8.8 mg/dL (ref 8.4–10.5)
CHLORIDE: 105 meq/L (ref 96–112)
CO2: 21 meq/L (ref 19–32)
CREATININE: 1.57 mg/dL — AB (ref 0.50–1.10)
GFR calc Af Amer: 33 mL/min — ABNORMAL LOW (ref >90)
GFR calc non Af Amer: 29 mL/min — ABNORMAL LOW (ref >90)
GLUCOSE: 159 mg/dL — AB (ref 70–99)
Potassium: 5.5 mEq/L — ABNORMAL HIGH (ref 3.7–5.3)
Sodium: 144 mEq/L (ref 137–147)

## 2013-11-22 LAB — MRSA PCR SCREENING: MRSA BY PCR: NEGATIVE

## 2013-11-22 LAB — TROPONIN I: Troponin I: 2 ng/mL (ref <0.30)

## 2013-11-22 LAB — PRO B NATRIURETIC PEPTIDE: Pro B Natriuretic peptide (BNP): >70000 pg/mL — ABNORMAL HIGH (ref 0.0–100.0)

## 2013-11-22 MED ORDER — ENOXAPARIN SODIUM 30 MG/0.3ML ~~LOC~~ SOLN
20.0000 mg | Freq: Every day | SUBCUTANEOUS | Status: DC
  Filled 2013-11-22: qty 0.2

## 2013-11-22 MED ORDER — ALUM & MAG HYDROXIDE-SIMETH 200-200-20 MG/5ML PO SUSP
30.0000 mL | Freq: Four times a day (QID) | ORAL | Status: DC | PRN
Start: 2013-11-22 — End: 2013-11-27

## 2013-11-22 MED ORDER — METOPROLOL TARTRATE 12.5 MG HALF TABLET
12.5000 mg | ORAL_TABLET | Freq: Two times a day (BID) | ORAL | Status: DC
  Administered 2013-11-22 – 2013-11-26 (×5): 12.5 mg via ORAL
  Filled 2013-11-22 (×12): qty 1

## 2013-11-22 MED ORDER — CALCIUM CARBONATE-VITAMIN D 500-200 MG-UNIT PO TABS
1.0000 | ORAL_TABLET | Freq: Every day | ORAL | Status: DC
  Administered 2013-11-22 – 2013-11-24 (×3): 1 via ORAL
  Filled 2013-11-22 (×4): qty 1

## 2013-11-22 MED ORDER — NITROGLYCERIN 2 % TD OINT
0.5000 [in_us] | TOPICAL_OINTMENT | Freq: Four times a day (QID) | TRANSDERMAL | Status: DC
  Administered 2013-11-22: 0.5 [in_us] via TOPICAL
  Filled 2013-11-22: qty 30

## 2013-11-22 MED ORDER — SODIUM CHLORIDE 0.9 % IJ SOLN: 3.0000 mL | Freq: Two times a day (BID) | INTRAMUSCULAR | Status: DC

## 2013-11-22 MED ORDER — FUROSEMIDE 10 MG/ML IJ SOLN
40.0000 mg | Freq: Two times a day (BID) | INTRAMUSCULAR | Status: DC
  Administered 2013-11-22: 40 mg via INTRAVENOUS
  Filled 2013-11-22: qty 4

## 2013-11-22 MED ORDER — ACETAMINOPHEN 325 MG PO TABS: 650.0000 mg | ORAL_TABLET | ORAL | Status: DC | PRN

## 2013-11-22 MED ORDER — HEPARIN BOLUS VIA INFUSION
1500.0000 [IU] | Freq: Once | INTRAVENOUS | Status: DC
  Filled 2013-11-22: qty 1500

## 2013-11-22 MED ORDER — HEPARIN (PORCINE) IN NACL 100-0.45 UNIT/ML-% IJ SOLN
550.0000 [IU]/h | INTRAMUSCULAR | Status: DC
  Filled 2013-11-22: qty 250

## 2013-11-22 MED ORDER — ONE-DAILY MULTI VITAMINS PO TABS: 1.0000 | ORAL_TABLET | Freq: Every day | ORAL | Status: DC

## 2013-11-22 MED ORDER — ASPIRIN 81 MG PO TABS: 81.0000 mg | ORAL_TABLET | Freq: Every day | ORAL | Status: DC

## 2013-11-22 MED ORDER — OMEGA-3-ACID ETHYL ESTERS 1 G PO CAPS
1.0000 g | ORAL_CAPSULE | Freq: Every day | ORAL | Status: DC
  Administered 2013-11-22 – 2013-11-24 (×3): 1 g via ORAL
  Filled 2013-11-22 (×3): qty 1

## 2013-11-22 MED ORDER — ACETAMINOPHEN 325 MG PO TABS
650.0000 mg | ORAL_TABLET | Freq: Four times a day (QID) | ORAL | Status: DC | PRN
  Administered 2013-11-24 – 2013-11-26 (×2): 650 mg via ORAL
  Filled 2013-11-22 (×2): qty 2

## 2013-11-22 MED ORDER — HEPARIN (PORCINE) IN NACL 100-0.45 UNIT/ML-% IJ SOLN
450.0000 [IU]/h | INTRAMUSCULAR | Status: DC
  Administered 2013-11-22: 450 [IU]/h via INTRAVENOUS
  Filled 2013-11-22: qty 250

## 2013-11-22 MED ORDER — OMEGA-3 FATTY ACIDS 1000 MG PO CAPS: 1.0000 g | ORAL_CAPSULE | Freq: Every day | ORAL | Status: DC

## 2013-11-22 MED ORDER — HEPARIN (PORCINE) IN NACL 100-0.45 UNIT/ML-% IJ SOLN
450.0000 [IU]/h | INTRAMUSCULAR | Status: DC
  Filled 2013-11-22: qty 250

## 2013-11-22 MED ORDER — ONDANSETRON HCL 4 MG/2ML IJ SOLN: 4.0000 mg | Freq: Four times a day (QID) | INTRAMUSCULAR | Status: DC | PRN

## 2013-11-22 MED ORDER — ZOLPIDEM TARTRATE 5 MG PO TABS
5.0000 mg | ORAL_TABLET | Freq: Every evening | ORAL | Status: DC | PRN
  Administered 2013-11-22 – 2013-11-25 (×3): 5 mg via ORAL
  Filled 2013-11-22 (×3): qty 1

## 2013-11-22 MED ORDER — SODIUM CHLORIDE 0.9 % IJ SOLN: 3.0000 mL | INTRAMUSCULAR | Status: DC | PRN

## 2013-11-22 MED ORDER — HYDROMORPHONE HCL PF 1 MG/ML IJ SOLN: 0.5000 mg | INTRAMUSCULAR | Status: DC | PRN

## 2013-11-22 MED ORDER — CALCIUM-VITAMIN D 600-200 MG-UNIT PO CAPS: 1.0000 | ORAL_CAPSULE | Freq: Every evening | ORAL | Status: DC

## 2013-11-22 MED ORDER — ADULT MULTIVITAMIN W/MINERALS CH
1.0000 | ORAL_TABLET | Freq: Every day | ORAL | Status: DC
  Administered 2013-11-22 – 2013-11-23 (×2): 1 via ORAL
  Filled 2013-11-22 (×3): qty 1

## 2013-11-22 MED ORDER — PIPERACILLIN-TAZOBACTAM IN DEX 2-0.25 GM/50ML IV SOLN
2.2500 g | Freq: Three times a day (TID) | INTRAVENOUS | Status: DC
  Administered 2013-11-22: 2.25 g via INTRAVENOUS
  Filled 2013-11-22 (×2): qty 50

## 2013-11-22 MED ORDER — MEGESTROL ACETATE 40 MG PO TABS
40.0000 mg | ORAL_TABLET | Freq: Every day | ORAL | Status: DC
  Administered 2013-11-22 – 2013-11-24 (×3): 40 mg via ORAL
  Filled 2013-11-22 (×3): qty 1

## 2013-11-22 MED ORDER — PANTOPRAZOLE SODIUM 40 MG PO TBEC
40.0000 mg | DELAYED_RELEASE_TABLET | Freq: Two times a day (BID) | ORAL | Status: DC
  Administered 2013-11-22 – 2013-11-23 (×3): 40 mg via ORAL
  Filled 2013-11-22 (×3): qty 1

## 2013-11-22 MED ORDER — SODIUM CHLORIDE 0.9 % IV SOLN
INTRAVENOUS | Status: DC
  Administered 2013-11-22 – 2013-11-23 (×2): via INTRAVENOUS

## 2013-11-22 MED ORDER — MIRTAZAPINE 7.5 MG PO TABS
7.5000 mg | ORAL_TABLET | Freq: Every day | ORAL | Status: DC
  Administered 2013-11-22 – 2013-11-26 (×5): 7.5 mg via ORAL
  Filled 2013-11-22 (×6): qty 1

## 2013-11-22 MED ORDER — ACETAMINOPHEN 650 MG RE SUPP: 650.0000 mg | Freq: Four times a day (QID) | RECTAL | Status: DC | PRN

## 2013-11-22 MED ORDER — HEPARIN BOLUS VIA INFUSION
1500.0000 [IU] | Freq: Once | INTRAVENOUS | Status: AC
  Administered 2013-11-22: 1500 [IU] via INTRAVENOUS
  Filled 2013-11-22: qty 1500

## 2013-11-22 MED ORDER — NITROGLYCERIN 0.4 MG SL SUBL: 0.4000 mg | SUBLINGUAL_TABLET | SUBLINGUAL | Status: DC | PRN

## 2013-11-22 MED ORDER — OXYCODONE HCL 5 MG PO TABS
5.0000 mg | ORAL_TABLET | ORAL | Status: DC | PRN
  Administered 2013-11-22 – 2013-11-27 (×5): 5 mg via ORAL
  Filled 2013-11-22 (×5): qty 1

## 2013-11-22 MED ORDER — ONDANSETRON HCL 4 MG PO TABS: 4.0000 mg | ORAL_TABLET | Freq: Four times a day (QID) | ORAL | Status: DC | PRN

## 2013-11-22 MED ORDER — NIFEDIPINE ER OSMOTIC RELEASE 90 MG PO TB24
90.0000 mg | ORAL_TABLET | Freq: Every day | ORAL | Status: DC
  Administered 2013-11-22 – 2013-11-23 (×2): 90 mg via ORAL
  Filled 2013-11-22 (×3): qty 1

## 2013-11-22 MED ORDER — ASPIRIN 81 MG PO CHEW
81.0000 mg | CHEWABLE_TABLET | Freq: Every day | ORAL | Status: DC
  Administered 2013-11-22 – 2013-11-24 (×3): 81 mg via ORAL
  Filled 2013-11-22 (×3): qty 1

## 2013-11-22 MED ORDER — SODIUM CHLORIDE 0.9 % IV SOLN
INTRAVENOUS | Status: DC
  Administered 2013-11-22: 03:00:00 via INTRAVENOUS

## 2013-11-22 MED ORDER — LORAZEPAM 0.5 MG PO TABS
0.5000 mg | ORAL_TABLET | Freq: Three times a day (TID) | ORAL | Status: DC | PRN
  Administered 2013-11-26 – 2013-11-27 (×3): 0.5 mg via ORAL
  Filled 2013-11-22 (×3): qty 1

## 2013-11-22 MED ORDER — SODIUM POLYSTYRENE SULFONATE 15 GM/60ML PO SUSP
30.0000 g | Freq: Once | ORAL | Status: AC
  Administered 2013-11-22: 30 g via ORAL
  Filled 2013-11-22: qty 120

## 2013-11-22 MED ORDER — NICOTINE 21 MG/24HR TD PT24
21.0000 mg | MEDICATED_PATCH | Freq: Every day | TRANSDERMAL | Status: DC
  Administered 2013-11-22 – 2013-11-24 (×3): 21 mg via TRANSDERMAL
  Filled 2013-11-22 (×3): qty 1

## 2013-11-22 MED ORDER — SODIUM CHLORIDE 0.9 % IV SOLN: 250.0000 mL | INTRAVENOUS | Status: DC | PRN

## 2013-11-22 MED ORDER — HYDROCHLOROTHIAZIDE 25 MG PO TABS
25.0000 mg | ORAL_TABLET | Freq: Every day | ORAL | Status: DC
  Administered 2013-11-22: 25 mg via ORAL
  Filled 2013-11-22: qty 1

## 2013-11-22 MED ORDER — ONDANSETRON HCL 4 MG/2ML IJ SOLN
4.0000 mg | Freq: Four times a day (QID) | INTRAMUSCULAR | Status: DC | PRN
  Administered 2013-11-22: 4 mg via INTRAVENOUS
  Filled 2013-11-22: qty 2

## 2013-11-22 NOTE — Progress Notes (Signed)
[place] TEAM 1 - Stepdown/ICU TEAM Progress Note  [name] HWE:[id number] DOB: [birth year] DOA: 11/21/2013 PCP: Joycelyn Man, MD  Admit HPI / Brief Narrative: 80 y.o. female who was brought to the ED due to increased weakness and fatigue x 1 week continuing to decline since her pacemaker placement 1 week previous to this admit. She had several falls and decreased PO intake. In the ED she was found to be hypoxic and was placed on BIPAP. A chest X-ray was negative for acute findings.  When she was hospitalized previously, hospice services where in the process of being arranged by her PCP.  Her family is currently interested in patient having palliative care services arranged.   HPI/Subjective: Pt seen for f/u visit.   Assessment/Plan:  Acute hypoxic resp failure CXR w/o acute findings - EF 55-60% via TTE 11/13/13 (unable to comment on DD)  Acute renal failure  crt climbing - crt was 1.0 at time of recent d/c   Hyperkalemia  Elevated Troponin/NSTEMI w/ known hx of CAD Trop 2.65 at time of admit - EKG showed paced rythmn - had cardiac cath earlier this month > 50-60% stenosis in the mid LAD and circumflex between OM1 and OM 2 with more significant lesion in a small ramus-type marginal branch of the circumflex - widely patent RCA stent but no significant lesions in the RCA   CHB s/p Medtronic Adapta L Model ADDR: 1 Pacer 1/16  DM  HLD  HTN  Hx of ovarian CA  Underweight - Body mass index is 13.95 kg/(m^2).  Code Status: NO CODE Family Communication: no family present at time of exam Disposition Plan: SDU   Consultants: none  Procedures: none  Antibiotics: Zosyn 1/23 >> vanc 1/23 >>  DVT prophylaxis: IV heparin   Objective: Blood pressure 90/55, pulse 60, temperature 98 F (36.7 C), temperature source Oral, resp. rate 21, height 5' (1.524 m), weight 32.4 kg (71 lb 6.9 oz), SpO2 0.00%.  Intake/Output Summary (Last 24 hours) at 11/22/13 1018 Last data  filed at 11/22/13 0600  Gross per 24 hour  Intake    325 ml  Output      0 ml  Net    325 ml   Exam: F/U exam completed   Data Reviewed: Basic Metabolic Panel:  Recent Labs Lab 11/21/13 2250 11/22/13 0815  NA 142 144  K 5.7* 5.5*  CL 102 105  CO2 18* 21  GLUCOSE 166* 159*  BUN 55* 59*  CREATININE 1.48* 1.57*  CALCIUM 8.5 8.8   Liver Function Tests:  Recent Labs Lab 11/21/13 2250  AST 130*  ALT 91*  ALKPHOS 95  BILITOT 0.4  PROT 6.1  ALBUMIN 2.7*   CBC:  Recent Labs Lab 11/21/13 2250  WBC 10.7*  NEUTROABS 8.8*  HGB 14.8  HCT 44.0  MCV 88.0  PLT 185   Cardiac Enzymes:  Recent Labs Lab 11/22/13 0815  TROPONINI 2.00*   BNP (last 3 results)  Recent Labs  06/26/13 1838 11/21/13 2250  PROBNP 944.8* >70000.0*   CBG:  Recent Labs Lab 11/15/13 1200 11/15/13 1659 11/16/13 1156  GLUCAP 162* 161* 246*    Recent Results (from the past 240 hour(s))  MRSA PCR SCREENING     Status: None   Collection Time    11/13/13  1:25 PM      Result Value Range Status   MRSA by PCR NEGATIVE  NEGATIVE Final   Comment:  The GeneXpert MRSA Assay (FDA     approved for NASAL specimens     only), is one component of a     comprehensive MRSA colonization     surveillance program. It is not     intended to diagnose MRSA     infection nor to guide or     monitor treatment for     MRSA infections.  MRSA PCR SCREENING     Status: None   Collection Time    11/22/13  2:24 AM      Result Value Range Status   MRSA by PCR NEGATIVE  NEGATIVE Final   Comment:            The GeneXpert MRSA Assay (FDA     approved for NASAL specimens     only), is one component of a     comprehensive MRSA colonization     surveillance program. It is not     intended to diagnose MRSA     infection nor to guide or     monitor treatment for     MRSA infections.     Studies:  Recent x-ray studies have been reviewed in detail by the Attending Physician  Scheduled  Meds:  Scheduled Meds: . calcium-vitamin D  1 tablet Oral Q breakfast  . furosemide  40 mg Intravenous Q12H  . hydrochlorothiazide  25 mg Oral Daily  . megestrol  40 mg Oral Daily  . metoprolol tartrate  12.5 mg Oral BID  . mirtazapine  7.5 mg Oral QHS  . multivitamin with minerals  1 tablet Oral Daily  . nicotine  21 mg Transdermal Daily  . NIFEdipine  90 mg Oral Daily  . nitroGLYCERIN  0.5 inch Topical Q6H  . omega-3 acid ethyl esters  1 g Oral Daily  . piperacillin-tazobactam (ZOSYN)  IV  2.25 g Intravenous Q8H  . sodium chloride  3 mL Intravenous Q12H    Time spent on care of this patient: 25+ mins   Brady  [phone] Pager - Text Page per Shea Evans as per below:  On-Call/Text Page:      [email or web address]      password [email or web address]  If 7PM-7AM, please contact night-coverage [email or web address] Password [email or web address] 11/22/2013, 10:18 AM   LOS: 1 day

## 2013-11-22 NOTE — Progress Notes (Signed)
ANTICOAGULATION CONSULT NOTE - Initial Consult  Pharmacy Consult for Heparin Indication: chest pain/ACS  No Known Allergies  Patient Measurements: Height: 5' (152.4 cm) Weight: 71 lb 6.9 oz (32.4 kg) IBW/kg (Calculated) : 45.5 Heparin Dosing Weight: 32.4  Vital Signs: Temp: 97.7 F (36.5 C) (01/24 1653) Temp src: Oral (01/24 1653) BP: 98/40 mmHg (01/24 1653) Pulse Rate: 65 (01/24 1653)  Labs:  Recent Labs  11/21/13 2250 11/22/13 0815 11/22/13 1720  HGB 14.8  --   --   HCT 44.0  --   --   PLT 185  --   --   HEPARINUNFRC  --   --  <0.10*  CREATININE 1.48* 1.57*  --   TROPONINI  --  2.00*  --     Estimated Creatinine Clearance: 13.2 ml/min (by C-G formula based on Cr of 1.57).   Medical History: Past Medical History  Diagnosis Date  . CAD (coronary artery disease)     prior stent to the LAD and RCA;  Last [facility] 9/05:  ostial LAD 25%, mid 25%, ostial diagonal 30%, proximal circumflex 25%, ostial OM1 25%, mid RCA 99% in-stent restenosis, then 50 and 40%.  PCI: Taxus DES to the mid RCA.  Last Myoview 5/12: EF 69%, inferior scar with minimal peri-infarct ischemia.  Last echo 2/03: EF 45-55%.  . Depression   . DM (diabetes mellitus)   . Hyperlipidemia   . Colon polyp   . Hyperplastic rectal polyp   . Ovarian cancer   . Cataract   . Bowel obstruction   . HTN (hypertension)   . Carotid stenosis     s/p right CEA 2000;  Dopplers 10/12: RICA 40-59%, right CEA okay, LICA 710%-[id number] one year    Medications:  Heparin drip @ 450 units/hr  Assessment: 88 YOF on IV heparin for ACS with low body weight and poor renal function. First heparin level is undetectable.   Goal of Therapy:  Heparin level 0.3-0.7 units/ml Monitor platelets by anticoagulation protocol: Yes   Plan:  1. No bolus. 2. Increase heparin to 550 units/hr.  3. Heparin level in 8 hrs.  4. Family discussing hospice care - consider discontinuing therapy.   Sloan Leiter, PharmD, BCPS Clinical  Pharmacist [phone] 11/22/2013,7:33 PM

## 2013-11-22 NOTE — ED Provider Notes (Signed)
CSN: [id number]     Arrival date & time 11/21/13  2203 History   First MD Initiated Contact with Patient 11/21/13 2214     Chief Complaint  Patient presents with  . Failure To Thrive  . Shortness of Breath    HPI: [name] is an 80 yo F with extensive medical history including but not limited to CAD, DM, HLD, complete heart block s/p pacemaker placement 6 days ago, who presents with fatigue, decreased appetite and shortness of breath. Since returning home from the hospital she has not been eating well. She suffered several falls over the last 24 hours. Today she was lethargic and appeared to be SOB so her son called EMS. On EMS arrival she was noted to be hypoxic, started on NRB. On arrival to the ED she appears to be in moderate respiratory distress, hypoxic on NRB, cold with poor perfusion. She endorses worsening LE edema but is unsure how long this has been present.   Past Medical History  Diagnosis Date  . CAD (coronary artery disease)     prior stent to the LAD and RCA;  Last [facility] 9/05:  ostial LAD 25%, mid 25%, ostial diagonal 30%, proximal circumflex 25%, ostial OM1 25%, mid RCA 99% in-stent restenosis, then 50 and 40%.  PCI: Taxus DES to the mid RCA.  Last Myoview 5/12: EF 69%, inferior scar with minimal peri-infarct ischemia.  Last echo 2/03: EF 45-55%.  . Depression   . DM (diabetes mellitus)   . Hyperlipidemia   . Colon polyp   . Hyperplastic rectal polyp   . Ovarian cancer   . Cataract   . Bowel obstruction   . HTN (hypertension)   . Carotid stenosis     s/p right CEA 2000;  Dopplers 10/12: RICA 40-59%, right CEA okay, LICA [id number] one year   Past Surgical History  Procedure Laterality Date  . Angioplasty    . Abdominal hysterectomy     Family History  Problem Relation Age of Onset  . Cancer Other     colon   History  Substance Use Topics  . Smoking status: Current Every Day Smoker -- 0.50 packs/day    Last Attempt to Quit: 01/01/2012  . Smokeless tobacco:  Not on file  . Alcohol Use: No   OB History   Grav Para Term Preterm Abortions TAB SAB Ect Mult Living                 Review of Systems  Unable to perform ROS: Mental status change    Allergies  Review of patient's allergies indicates no known allergies.  Home Medications  No current outpatient prescriptions on file. BP 90/55  Pulse 60  Temp(Src) 98 F (36.7 C) (Oral)  Resp 21  Ht 5' (1.524 m)  Wt 71 lb 6.9 oz (32.4 kg)  BMI 13.95 kg/m2  SpO2 0% Physical Exam  Nursing note and vitals reviewed. Constitutional: She is oriented to person, place, and time. She appears lethargic. She has a sickly appearance. She appears distressed. Face mask in place.  Cachetic, ill appearing, elderly female, sitting up in bed, tachypneic with retractions, able to speak in 1-2 word sentences.   HENT:  Head: Normocephalic and atraumatic.  Mouth/Throat: Mucous membranes are dry.  Eyes: Conjunctivae and EOM are normal. Pupils are equal, round, and reactive to light.  Neck: Normal range of motion. Neck supple.  Cardiovascular: Normal rate and intact distal pulses.  An irregularly irregular rhythm present. Exam reveals distant heart  sounds.   Pulmonary/Chest: Accessory muscle usage present. Tachypnea noted. She is in respiratory distress. She has decreased breath sounds (in all lung fields). She has rales (at the bases).  Abdominal: Soft. Bowel sounds are normal. There is no tenderness. There is no rebound and no guarding.  Musculoskeletal: Normal range of motion. She exhibits edema (2+ LE edema, symmetric). She exhibits no tenderness.  Neurological: She is oriented to person, place, and time. She appears lethargic. No cranial nerve deficit. Coordination normal. GCS eye subscore is 4. GCS verbal subscore is 5. GCS motor subscore is 6.  Skin: Skin is dry. No rash noted. There is pallor.  Cold and dry     ED Course  Procedures (including critical care time) Labs Review Labs Reviewed   COMPREHENSIVE METABOLIC PANEL - Abnormal; Notable for the following:    Potassium 5.7 (*)    CO2 18 (*)    Glucose, Bld 166 (*)    BUN 55 (*)    Creatinine, Ser 1.48 (*)    Albumin 2.7 (*)    AST 130 (*)    ALT 91 (*)    GFR calc non Af Amer 31 (*)    GFR calc Af Amer 36 (*)    All other components within normal limits  CBC WITH DIFFERENTIAL - Abnormal; Notable for the following:    WBC 10.7 (*)    RDW 16.6 (*)    Neutrophils Relative % 82 (*)    Neutro Abs 8.8 (*)    Lymphocytes Relative 10 (*)    All other components within normal limits  PRO B NATRIURETIC PEPTIDE - Abnormal; Notable for the following:    Pro B Natriuretic peptide (BNP) >70000.0 (*)    All other components within normal limits  HEPARIN LEVEL (UNFRACTIONATED) - Abnormal; Notable for the following:    Heparin Unfractionated <0.10 (*)    All other components within normal limits  TROPONIN I - Abnormal; Notable for the following:    Troponin I 2.00 (*)    All other components within normal limits  BASIC METABOLIC PANEL - Abnormal; Notable for the following:    Potassium 5.5 (*)    Glucose, Bld 159 (*)    BUN 59 (*)    Creatinine, Ser 1.57 (*)    GFR calc non Af Amer 29 (*)    GFR calc Af Amer 33 (*)    All other components within normal limits  CG4 I-STAT (LACTIC ACID) - Abnormal; Notable for the following:    Lactic Acid, Venous 3.80 (*)    All other components within normal limits  POCT I-STAT TROPONIN I - Abnormal; Notable for the following:    Troponin i, poc 2.65 (*)    All other components within normal limits  MRSA PCR SCREENING  COMPREHENSIVE METABOLIC PANEL  CBC  TROPONIN I  HEPARIN LEVEL (UNFRACTIONATED)   Imaging Review Dg Chest Portable 1 View  11/21/2013   CLINICAL DATA:  Short-of-breath  EXAM: PORTABLE CHEST - 1 VIEW  COMPARISON:  11/15/2013  FINDINGS: Left chest wall pacer device is noted with lead in the right atrial appendage and right ventricle. Calcified atherosclerotic disease  involves the thoracic aorta. The heart size and mediastinal contours are within normal limits. Both lungs are clear. The visualized skeletal structures are unremarkable.  IMPRESSION: No active disease.   Electronically Signed   By: Kerby Moors M.D.   On: 11/21/2013 22:43    EKG Interpretation    Date/Time:  Friday November 21 2013  22:34:31 EST Ventricular Rate:  63 PR Interval:  226 QRS Duration: 136 QT Interval:  430 QTC Calculation: 440 R Axis:   -92 Text Interpretation:  Electronic ventricular pacemaker Atrial fibrillation No significant change since last tracing Confirmed by DOCHERTY  MD, MEGAN [phone]) on 11/21/2013 10:53:27 PM            MDM  80 yo F with extensive medical history presents with respiratory failure. Broad differential given her co-morbid conditions and recent admission. Started on BiPAP shortly after arrival with improvement of oxygen saturations into the 90's. Portable chest x-ray without acute cardiopulmonary disease. ECG shows STE in anterior leads but she has an underlying paced rhythm so difficult to interpret. Initial troponin elevated to 2.0. Other labs significant for WBC 10.7, Hgb 14.8, K 5.7, CO2 18, lactic acid 3.8, Cr 1.5. BUN 55, AST 130, ALT 91. BNP >7000. She does appear to be volume overloaded. I spoke to the patient's daughter at home, she states the patient does not want any life prolonging therapies. I explained to her the critical nature of her illness. At the conclusion of her w/u felt CHF exacerbation is likely the  cause of her symptoms but ACS and PE still possible. Troponin elevated to 2.6 which may be demand ischemia. Her respiratory status improved significantly while on BiPAP. She was started empirically on heparin gtt for ACS (elevated troponin) and PE. She was admitted to Hospitalist service. She will likely be palliative care in the near future. Her family was discussing Hospice with their primary care physician. She was admitted in critical  but stable condition.   Reviewed imaging, labs and previous medical records, utilized in MDM  Discussed case with Dr. Tawnya Crook  Clinical Impression 1. Acute hypoxic respiratory failure 2. Hyperkalemia 3. Lactic acidosis 4. Acute on chronic congestive heart failure exacerbation 5. Acute kidney injury 6. Elevated troponin    Louretta Shorten, MD 11/23/13 [phone]

## 2013-11-22 NOTE — H&P (Signed)
[facility] History and Physical  [name] [id number] DOB: Nov 18, 1924 DOA: 11/21/2013  Referring physician: EDP PCP: Joycelyn Man, MD  Specialists:   Chief Complaint: Weakness  HPI: [name] is a 80 y.o. female who was brought to the ED due to increased weakness and fatigue x 1 week continuing to decline since her pacemaker placement 1 week ago.   She has had several fall and has had a decreased PO intake.  In the ED she was found to be hypoxic and was placed on BIPAP, and improved.  A chest X-ray was performed and and was negative for acute findings.   She was referred for medical admission.     When she was hospitalized 1 week ago, hospice services where in the process of being arranged by her PCP, and her family is interested in patient having palliative care services arranged.        Review of Systems: Unable to Obtain from the Patient at this time  Past Medical History  Diagnosis Date  . CAD (coronary artery disease)     prior stent to the LAD and RCA;  Last [facility] 9/05:  ostial LAD 25%, mid 25%, ostial diagonal 30%, proximal circumflex 25%, ostial OM1 25%, mid RCA 99% in-stent restenosis, then 50 and 40%.  PCI: Taxus DES to the mid RCA.  Last Myoview 5/12: EF 69%, inferior scar with minimal peri-infarct ischemia.  Last echo 2/03: EF 45-55%.  . Depression   . DM (diabetes mellitus)   . Hyperlipidemia   . Colon polyp   . Hyperplastic rectal polyp   . Ovarian cancer   . Cataract   . Bowel obstruction   . HTN (hypertension)   . Carotid stenosis     s/p right CEA 2000;  Dopplers 10/12: RICA 40-59%, right CEA okay, LICA [id number] one year     Past Surgical History  Procedure Laterality Date  . Angioplasty    . Abdominal hysterectomy      Prior to Admission medications   Medication Sig Start Date End Date Taking? Authorizing Provider  aspirin 81 MG tablet Take 81 mg by mouth daily.     Yes Historical Provider, MD  Calcium Carbonate-Vitamin D  (CALCIUM-VITAMIN D) 600-200 MG-UNIT CAPS Take 1 tablet by mouth every evening.    Yes Historical Provider, MD  fish oil-omega-3 fatty acids 1000 MG capsule Take 1 g by mouth daily. 1 tab po qd   Yes Historical Provider, MD  furosemide (LASIX) 20 MG tablet Take 1 tablet (20 mg total) by mouth daily. 06/27/13  Yes Marletta Lor, MD  hydrochlorothiazide (HYDRODIURIL) 12.5 MG tablet 1 tablet Monday Wednesday Friday 08/21/13  Yes Dorena Cookey, MD  LORazepam (ATIVAN) 0.5 MG tablet Take 1 tablet (0.5 mg total) by mouth every 8 (eight) hours as needed for anxiety. 11/16/13  Yes Lendon Colonel, NP  meclizine (ANTIVERT) 50 MG tablet Take 0.5 tablets (25 mg total) by mouth 3 (three) times daily as needed. 10/15/13  Yes Timoteo Gaul, FNP  megestrol (MEGACE) 40 MG tablet Take 1 tablet (40 mg total) by mouth daily. 11/16/13  Yes Lendon Colonel, NP  metoprolol tartrate (LOPRESSOR) 12.5 mg TABS tablet Take 0.5 tablets (12.5 mg total) by mouth 2 (two) times daily. 11/16/13  Yes Lendon Colonel, NP  mirtazapine (REMERON) 7.5 MG tablet Take 7.5 mg by mouth at bedtime.   Yes Historical Provider, MD  Multiple Vitamin (MULTIVITAMIN) tablet Take 1 tablet by mouth daily.  Yes Historical Provider, MD  NIFEdipine (PROCARDIA XL/ADALAT-CC) 90 MG 24 hr tablet Take 90 mg by mouth daily.   Yes Historical Provider, MD  ramipril (ALTACE) 10 MG capsule Take 10 mg by mouth at bedtime.   Yes Historical Provider, MD  zolpidem (AMBIEN) 5 MG tablet Take 5 mg by mouth at bedtime as needed for sleep.   Yes Historical Provider, MD  nicotine (NICODERM CQ - DOSED IN MG/24 HOURS) 21 mg/24hr patch Place 1 patch (21 mg total) onto the skin daily. 11/16/13   Lendon Colonel, NP  nitroGLYCERIN (NITROSTAT) 0.4 MG SL tablet Place 0.4 mg under the tongue every 5 (five) minutes as needed for chest pain.    Historical Provider, MD     No Known Allergies   Social History:  reports that she has been smoking.  She does not  have any smokeless tobacco history on file. She reports that she does not drink alcohol or use illicit drugs.     Family History  Problem Relation Age of Onset  . Cancer Other     colon       Physical Exam:  GEN:  Pleasant Frail Appearing Elderly 80 y.o. Caucasian female  examined  and in mild distess; cooperative with exam Filed Vitals:   11/22/13 0000 11/22/13 0015 11/22/13 0151 11/22/13 0225  BP:   150/69 127/45  Pulse: 80     Temp:    97.9 F (36.6 C)  TempSrc:    Oral  Resp: 19 20 22 18   Height:    5' (1.524 m)  Weight:    32.4 kg (71 lb 6.9 oz)  SpO2: 100%  97% 100%   Blood pressure 127/45, pulse 80, temperature 97.9 F (36.6 C), temperature source Oral, resp. rate 18, height 5' (1.524 m), weight 32.4 kg (71 lb 6.9 oz), SpO2 100.00%. PSYCH: She is alert and oriented x4; does not appear anxious does not appear depressed; affect is normal HEENT: Normocephalic and Atraumatic, Mucous membranes pink; PERRLA; EOM intact; Fundi:  Benign;  No scleral icterus, Nares: Patent, Oropharynx: Clear, Edentulous, Neck:  FROM, no cervical lymphadenopathy nor thyromegaly or carotid bruit; no JVD; Breasts:: Not examined CHEST WALL: No tenderness CHEST: Normal respiration, clear to auscultation bilaterally HEART: Regular rate and rhythm; no murmurs rubs or gallops BACK: No kyphosis or scoliosis; no CVA tenderness ABDOMEN: Positive Bowel Sounds, Scaphoid, soft non-tender; no masses, no organomegaly.   Rectal Exam: Not done EXTREMITIES: No cyanosis, clubbing or edema; no ulcerations. Genitalia: not examined PULSES: 2+ and symmetric SKIN: Normal hydration no rash or ulceration CNS: Neurologic Examination:  Mental Status:  Alert, oriented, thought content appropriate. Speech fluent without evidence of aphasia. Able to follow 3 step commands without difficulty. In No obvious pain.  Cranial Nerves:  Sensory: Intact Motor: Intact Cerebellar: Intact Gait: deferred  Vascular: pulses palpable  throughout    Labs on Admission:  Basic Metabolic Panel:  Recent Labs Lab 11/15/13 0440 11/21/13 2250  NA 141 142  K 4.9 5.7*  CL 106 102  CO2 22 18*  GLUCOSE 137* 166*  BUN 47* 55*  CREATININE 1.03 1.48*  CALCIUM 8.4 8.5   Liver Function Tests:  Recent Labs Lab 11/21/13 2250  AST 130*  ALT 91*  ALKPHOS 95  BILITOT 0.4  PROT 6.1  ALBUMIN 2.7*   No results found for this basename: LIPASE, AMYLASE,  in the last 168 hours No results found for this basename: AMMONIA,  in the last 168 hours CBC:  Recent  Labs Lab 11/21/13 2250  WBC 10.7*  NEUTROABS 8.8*  HGB 14.8  HCT 44.0  MCV 88.0  PLT 185   Cardiac Enzymes: No results found for this basename: CKTOTAL, CKMB, CKMBINDEX, TROPONINI,  in the last 168 hours  BNP (last 3 results)  Recent Labs  06/26/13 1838 11/21/13 2250  PROBNP 944.8* >70000.0*   CBG:  Recent Labs Lab 11/15/13 0754 11/15/13 1200 11/15/13 1659 11/16/13 1156  GLUCAP 134* 162* 161* 246*    Radiological Exams on Admission: Dg Chest Portable 1 View  11/21/2013   CLINICAL DATA:  Short-of-breath  EXAM: PORTABLE CHEST - 1 VIEW  COMPARISON:  11/15/2013  FINDINGS: Left chest wall pacer device is noted with lead in the right atrial appendage and right ventricle. Calcified atherosclerotic disease involves the thoracic aorta. The heart size and mediastinal contours are within normal limits. Both lungs are clear. The visualized skeletal structures are unremarkable.  IMPRESSION: No active disease.   Electronically Signed   By: Kerby Moors M.D.   On: 11/21/2013 22:43     EKG: Independently reviewed. Paced Rhythm,  LBBB    Assessment/Plan:   80 y.o. female with  Principal Problem:   Respiratory failure Active Problems:   HYPERLIPIDEMIA   HYPERTENSION   Tobacco abuse   CAD (coronary artery disease)   Hyperkalemia   Elevated troponin   Hypoxia   Acute on Chronic Congestive Heart Failure   HCAP   1.   Respiratory Failure/Hypoxia-  Due  to  Decompensated CHF, placed on The Acute CHF protocol, placed on BIPAP in ED for hypoxia.   Diurese with IV lasix.  Placed on IV Vanc and Zosyn in Ed to cover HCAP.     2.   Elevated Troponin/NSTEMI/CAD-  Placed on IV heparin, and Nitropaste, and on BIPAP/O2.     3.   Hyperkalemia-  Kayexalate x 1 dose.  Recheck in 6 hours.    4.   HTN-  On Lasix, Metoprolol,  And HCTZ at home.    5.   Hyperlipidemia-  On Omega 3 Fatty Acids at home check lipids.    6.   Tobacco Abuse-  Nicotine patch q day.       Code Status:   DO NOT RESUSCITATE Family Communication:  No Family Present Disposition Plan:    Inpatient  Time spent:  3 minutes  [facility] Pager [phone]  If 7PM-7AM, please contact night-coverage [email or web address] Password [email or web address] 11/22/2013, 3:13 AM

## 2013-11-22 NOTE — Progress Notes (Signed)
Nutrition Brief Note  Patient identified on the Malnutrition Screening Tool (MST) Report for unintentional weight loss and poor intake. Chart reviewed. Body mass index is 13.95 kg/(m^2). Patient meets criteria for underweight based on current BMI. Per previous RD assessment on most recent admission, patient with severe protein calorie malnutrition. This is ongoing. Plans are for comfort care with Hospice.  No nutrition interventions warranted at this time. If nutrition issues arise, please consult RD.   Molli Barrows, RD, LDN, [place] Pager [phone] After Hours Pager [phone]

## 2013-11-22 NOTE — ED Notes (Signed)
"  Comfort care" confirmed between family and Dr. Zenia Resides via phone. Pt tolerating Bipap, comfortable on bipap at this time. States, "want the mask that helps me breathe better". Bipap temporarily removed for sip of water per pt request.

## 2013-11-22 NOTE — Progress Notes (Signed)
ANTIBIOTIC CONSULT NOTE - INITIAL  Pharmacy Consult for Vancomycin  Indication: rule out pneumonia  No Known Allergies  Patient Measurements: Height: 5' (152.4 cm) Weight: 71 lb 6.9 oz (32.4 kg) IBW/kg (Calculated) : 45.5  Vital Signs: Temp: 97.9 F (36.6 C) (01/24 0225) Temp src: Oral (01/24 0225) BP: 127/45 mmHg (01/24 0225) Pulse Rate: 80 (01/24 0000) Intake/Output from previous day: 01/23 0701 - 01/24 0700 In: 100 [IV Piggyback:100] Out: -  Intake/Output from this shift: Total I/O In: 100 [IV Piggyback:100] Out: -   Labs:  Recent Labs  11/21/13 2250  WBC 10.7*  HGB 14.8  PLT 185  CREATININE 1.48*   Estimated Creatinine Clearance: 14 ml/min (by C-G formula based on Cr of 1.48). No results found for this basename: VANCOTROUGH, Corlis Leak, VANCORANDOM, GENTTROUGH, GENTPEAK, GENTRANDOM, TOBRATROUGH, TOBRAPEAK, TOBRARND, AMIKACINPEAK, AMIKACINTROU, AMIKACIN,  in the last 72 hours   Microbiology: Recent Results (from the past 720 hour(s))  MRSA PCR SCREENING     Status: None   Collection Time    11/13/13  1:25 PM      Result Value Range Status   MRSA by PCR NEGATIVE  NEGATIVE Final   Comment:            The GeneXpert MRSA Assay (FDA     approved for NASAL specimens     only), is one component of a     comprehensive MRSA colonization     surveillance program. It is not     intended to diagnose MRSA     infection nor to guide or     monitor treatment for     MRSA infections.    Medical History: Past Medical History  Diagnosis Date  . CAD (coronary artery disease)     prior stent to the LAD and RCA;  Last [facility] 9/05:  ostial LAD 25%, mid 25%, ostial diagonal 30%, proximal circumflex 25%, ostial OM1 25%, mid RCA 99% in-stent restenosis, then 50 and 40%.  PCI: Taxus DES to the mid RCA.  Last Myoview 5/12: EF 69%, inferior scar with minimal peri-infarct ischemia.  Last echo 2/03: EF 45-55%.  . Depression   . DM (diabetes mellitus)   . Hyperlipidemia   . Colon  polyp   . Hyperplastic rectal polyp   . Ovarian cancer   . Cataract   . Bowel obstruction   . HTN (hypertension)   . Carotid stenosis     s/p right CEA 2000;  Dopplers 10/12: RICA 40-59%, right CEA okay, LICA 737%-[id number] one year   Assessment: 80 y/o F to start vancomycin/zosyn for r/o PNA. WBC 10.4, afebrile, noted renal dysfunction, other labs as above.   Goal of Therapy:  Vancomycin trough level 15-20 mcg/ml  Plan: -Vancomycin 500 mg IV x 1 received in the ED at 2317, would check random level in 48 hours (1/25 at 2300) to assess dosing needs -Zosyn per MD -Trend WBC, temp, renal function  -F/U MD plans  Narda Bonds 11/22/2013,3:34 AM

## 2013-11-22 NOTE — Progress Notes (Signed)
ANTICOAGULATION CONSULT NOTE - Initial Consult  Pharmacy Consult for Heparin  Indication: chest pain/ACS  No Known Allergies  Patient Measurements: 29.9kg, verified   Vital Signs: BP: 116/80 mmHg (01/23 2300)  Labs:  Recent Labs  11/21/13 2250  HGB 14.8  HCT 44.0  PLT 185  CREATININE 1.48*   Medical History: Past Medical History  Diagnosis Date  . CAD (coronary artery disease)     prior stent to the LAD and RCA;  Last [facility] 9/05:  ostial LAD 25%, mid 25%, ostial diagonal 30%, proximal circumflex 25%, ostial OM1 25%, mid RCA 99% in-stent restenosis, then 50 and 40%.  PCI: Taxus DES to the mid RCA.  Last Myoview 5/12: EF 69%, inferior scar with minimal peri-infarct ischemia.  Last echo 2/03: EF 45-55%.  . Depression   . DM (diabetes mellitus)   . Hyperlipidemia   . Colon polyp   . Hyperplastic rectal polyp   . Ovarian cancer   . Cataract   . Bowel obstruction   . HTN (hypertension)   . Carotid stenosis     s/p right CEA 2000;  Dopplers 10/12: RICA 40-59%, right CEA okay, LICA 761%-[id number] one year   Assessment: 80 y/o F to start heparin for +troponin. Labs as above, noted elevated Scr, small weight.   Goal of Therapy:  Heparin level 0.3-0.7 units/ml Monitor platelets by anticoagulation protocol: Yes   Plan:  -Heparin 1500 units BOLUS x 1 -Start heparin drip at 450 units/hr -8 hour HL at 0830 -Daily CBC/HL -Monitor for bleeding -F/U cardiology plans  Narda Bonds 11/22/2013,12:00 AM

## 2013-11-23 LAB — COMPREHENSIVE METABOLIC PANEL
ALK PHOS: 60 U/L (ref 39–117)
ALT: 45 U/L — AB (ref 0–35)
AST: 38 U/L — AB (ref 0–37)
Albumin: 2.1 g/dL — ABNORMAL LOW (ref 3.5–5.2)
BILIRUBIN TOTAL: 0.2 mg/dL — AB (ref 0.3–1.2)
BUN: 51 mg/dL — ABNORMAL HIGH (ref 6–23)
CHLORIDE: 105 meq/L (ref 96–112)
CO2: 24 meq/L (ref 19–32)
Calcium: 7.7 mg/dL — ABNORMAL LOW (ref 8.4–10.5)
Creatinine, Ser: 1.42 mg/dL — ABNORMAL HIGH (ref 0.50–1.10)
GFR, EST AFRICAN AMERICAN: 38 mL/min — AB (ref >90)
GFR, EST NON AFRICAN AMERICAN: 32 mL/min — AB (ref >90)
GLUCOSE: 114 mg/dL — AB (ref 70–99)
POTASSIUM: 3.5 meq/L — AB (ref 3.7–5.3)
SODIUM: 143 meq/L (ref 137–147)
Total Protein: 4.7 g/dL — ABNORMAL LOW (ref 6.0–8.3)

## 2013-11-23 LAB — CBC
HCT: 30.6 % — ABNORMAL LOW (ref 36.0–46.0)
Hemoglobin: 10.1 g/dL — ABNORMAL LOW (ref 12.0–15.0)
MCH: 29 pg (ref 26.0–34.0)
MCHC: 33 g/dL (ref 30.0–36.0)
MCV: 87.9 fL (ref 78.0–100.0)
Platelets: 160 10*3/uL (ref 150–400)
RBC: 3.48 MIL/uL — ABNORMAL LOW (ref 3.87–5.11)
RDW: 16.9 % — AB (ref 11.5–15.5)
WBC: 10.1 10*3/uL (ref 4.0–10.5)

## 2013-11-23 LAB — GLUCOSE, CAPILLARY
GLUCOSE-CAPILLARY: 227 mg/dL — AB (ref 70–99)
GLUCOSE-CAPILLARY: 92 mg/dL (ref 70–99)
Glucose-Capillary: 197 mg/dL — ABNORMAL HIGH (ref 70–99)

## 2013-11-23 LAB — HEPARIN LEVEL (UNFRACTIONATED): HEPARIN UNFRACTIONATED: 0.29 [IU]/mL — AB (ref 0.30–0.70)

## 2013-11-23 LAB — TROPONIN I: TROPONIN I: 1.11 ng/mL — AB (ref <0.30)

## 2013-11-23 MED ORDER — INSULIN ASPART 100 UNIT/ML ~~LOC~~ SOLN
0.0000 [IU] | Freq: Three times a day (TID) | SUBCUTANEOUS | Status: DC
  Administered 2013-11-23: 3 [IU] via SUBCUTANEOUS
  Administered 2013-11-24 (×2): 1 [IU] via SUBCUTANEOUS
  Administered 2013-11-24 – 2013-11-26 (×5): 2 [IU] via SUBCUTANEOUS
  Administered 2013-11-27: 3 [IU] via SUBCUTANEOUS
  Administered 2013-11-27: 1 [IU] via SUBCUTANEOUS

## 2013-11-23 NOTE — ED Provider Notes (Signed)
Medical screening examination/treatment/procedure(s) were conducted as a shared visit with resident-physician practitioner(s) and myself.  I personally evaluated the patient during the encounter.  Pt is a 80 y.o. female with pmhx of CAD, DM, HLD, recent pacemaker placement for 3rd deg heart block presenting with respiratory distress.  Upon arrival, pt hypoxic on NRB, poorly perfusing, cool, with decreased mentation. Breath sounds decreased throughout, inc WOB w/ use of accessory muscles.   BiPAP initiated upon arrival with improvement of O2 sats and WOB. Clinical picture c/w acute CHF with elevated BNP, LE edema, though ACS a possibility given elevated trop.   EKG difficult to interpret given paced rhythm, but is similar to prior PE also in ddx given recent hospitalization, tachycardic, hypoxia. Pt started empirically on heparin gtt.  Dr. Zenia Resides spoke with pt's son in dept and also her daughter who is HCPOA.  She is DNI/DNI and they are interested in pursuing comfort care.  They do not want invasive procedures such as cardiac cath.  [facility].    EKG Interpretation    Date/Time:  Friday November 21 2013 22:34:31 EST Ventricular Rate:  63 PR Interval:  226 QRS Duration: 136 QT Interval:  430 QTC Calculation: 440 R Axis:   -92 Text Interpretation:  Electronic ventricular pacemaker Atrial fibrillation No significant change since last tracing Confirmed by Tarry Blayney  MD, Amali Uhls [phone]) on 11/21/2013 10:53:27 PM           CRITICAL CARE Performed by: Ernestina Patches, E Total critical care time: 35 Critical care time was exclusive of separately billable procedures and treating other patients. Critical care was necessary to treat or prevent imminent or life-threatening deterioration. Critical care was time spent personally by me on the following activities: development of treatment plan with patient and/or surrogate as well as nursing, discussions with consultants, evaluation of patient's  response to treatment, examination of patient, obtaining history from patient or surrogate, ordering and performing treatments and interventions, ordering and review of laboratory studies, ordering and review of radiographic studies, pulse oximetry and re-evaluation of patient's condition.  1. Respiratory failure   2. Hypoxia   3. CAD (coronary artery disease)   4. DM (diabetes mellitus)   5. Unspecified essential hypertension   6. Hyperkalemia   7. Hyperlipidemia   8. Other and unspecified hyperlipidemia   9. Tobacco abuse      Neta Ehlers, MD 11/23/13 1153

## 2013-11-23 NOTE — Progress Notes (Signed)
[place] TEAM 1 - Stepdown/ICU TEAM Progress Note  [name] BDZ:[id number] DOB: 06/03/1925 DOA: 11/21/2013 PCP: Joycelyn Man, MD  Admit HPI / Brief Narrative: 80 y.o. female who was brought to the ED due to increased weakness and fatigue x 1 week continuing to decline since her pacemaker placement 1 week previous to this admit. She had several falls and decreased PO intake. In the ED she was found to be hypoxic and was placed on BIPAP. A chest X-ray was negative for acute findings.   When she was hospitalized previously, hospice services where in the process of being arranged by her PCP.  Her family is currently interested in patient having palliative care services arranged.   HPI/Subjective: Pt is alert and conversant.  She c/o achey pain all over but no focal pain.  Denies SSCP, sob, n/v, or abdom pain.  No family is present at time of exam.    Assessment/Plan:  Acute hypoxic resp failure CXR w/o acute findings - EF 55-60% via TTE 11/13/13 (unable to comment on DD) - will attempt to wean O2 - monitor in SDU one additional night until clear if pt will require return to BIPAP/goals of care more firmly established   Acute renal failure  crt was 1.0 at time of recent d/c - appears to be slowly improving w/ volume expansion - follow for now as a prognostic indicator   Hyperkalemia Resolved   Elevated Troponin / NSTEMI w/ known hx of CAD Trop 2.65 at time of admit - EKG showed paced rythmn - had cardiac cath earlier this month > 50-60% stenosis in the mid LAD and circumflex between OM1 and OM 2 with more significant lesion in a small ramus-type marginal branch of the circumflex - widely patent RCA stent but no significant lesions in the RCA - troponin is quickly improving - d/c heparin - follow clinically   CHB s/p [employer] Adapta L Model ADDR: 1 Pacer 1/16  DM SSI only for now - follow CBG trend   HLD  tx not appropriate at present due to severe malnutrition and poor  intake   HTN Not an active problem at this time   Hx of ovarian CA  Underweight - Body mass index is 14.17 kg/(m^2).  Code Status: NO CODE Family Communication: no family present at time of exam Disposition Plan: SDU until clear BIPAP no longer needed   Consultants: none  Procedures: none  Antibiotics: Zosyn 1/23 vanc 1/23  DVT prophylaxis: IV heparin >> SCDs  Objective: Blood pressure 87/42, pulse 60, temperature 97.8 F (36.6 C), temperature source Oral, resp. rate 19, height 5' (1.524 m), weight 32.9 kg (72 lb 8.5 oz), SpO2 95.00%.  Intake/Output Summary (Last 24 hours) at 11/23/13 1347 Last data filed at 11/23/13 1245  Gross per 24 hour  Intake 1244.88 ml  Output     75 ml  Net 1169.88 ml   Exam: General: No acute respiratory distress at rest - cachectic  Lungs: Clear to auscultation bilaterally without wheezes or crackles Cardiovascular: Regular rate and rhythm without murmur gallop or rub normal S1 and S2 Abdomen: Nontender, nondistended, soft, bowel sounds positive, no rebound, no ascites, no appreciable mass Extremities: No significant cyanosis, clubbing, or edema bilateral lower extremities  Data Reviewed: Basic Metabolic Panel:  Recent Labs Lab 11/21/13 2250 11/22/13 0815 11/23/13 0845  NA 142 144 143  K 5.7* 5.5* 3.5*  CL 102 105 105  CO2 18* 21 24  GLUCOSE 166* 159* 114*  BUN 55*  59* 51*  CREATININE 1.48* 1.57* 1.42*  CALCIUM 8.5 8.8 7.7*   Liver Function Tests:  Recent Labs Lab 11/21/13 2250 11/23/13 0845  AST 130* 38*  ALT 91* 45*  ALKPHOS 95 60  BILITOT 0.4 0.2*  PROT 6.1 4.7*  ALBUMIN 2.7* 2.1*   CBC:  Recent Labs Lab 11/21/13 2250 11/23/13 0845  WBC 10.7* 10.1  NEUTROABS 8.8*  --   HGB 14.8 10.1*  HCT 44.0 30.6*  MCV 88.0 87.9  PLT 185 160   Cardiac Enzymes:  Recent Labs Lab 11/22/13 0815 11/23/13 0845  TROPONINI 2.00* 1.11*   BNP (last 3 results)  Recent Labs  06/26/13 1838 11/21/13 2250  PROBNP  944.8* >70000.0*   CBG:  Recent Labs Lab 11/23/13 1237  GLUCAP 227*    Recent Results (from the past 240 hour(s))  MRSA PCR SCREENING     Status: None   Collection Time    11/22/13  2:24 AM      Result Value Range Status   MRSA by PCR NEGATIVE  NEGATIVE Final   Comment:            The GeneXpert MRSA Assay (FDA     approved for NASAL specimens     only), is one component of a     comprehensive MRSA colonization     surveillance program. It is not     intended to diagnose MRSA     infection nor to guide or     monitor treatment for     MRSA infections.     Studies:  Recent x-ray studies have been reviewed in detail by the Attending Physician  Scheduled Meds:  Scheduled Meds: . aspirin  81 mg Oral Daily  . calcium-vitamin D  1 tablet Oral Q breakfast  . insulin aspart  0-9 Units Subcutaneous TID WC  . megestrol  40 mg Oral Daily  . metoprolol tartrate  12.5 mg Oral BID  . mirtazapine  7.5 mg Oral QHS  . multivitamin with minerals  1 tablet Oral Daily  . nicotine  21 mg Transdermal Daily  . NIFEdipine  90 mg Oral Daily  . omega-3 acid ethyl esters  1 g Oral Daily  . pantoprazole  40 mg Oral BID    Time spent on care of this patient: 35 mins   [place]  [phone] Pager - Text Page per Shea Evans as per below:  On-Call/Text Page:      [email or web address]      password [email or web address]  If 7PM-7AM, please contact night-coverage [email or web address] Password [email or web address] 11/23/2013, 1:47 PM   LOS: 2 days

## 2013-11-24 ENCOUNTER — Ambulatory Visit: Payer: Medicare Other

## 2013-11-24 LAB — CBC
HCT: 30.3 % — ABNORMAL LOW (ref 36.0–46.0)
Hemoglobin: 10 g/dL — ABNORMAL LOW (ref 12.0–15.0)
MCH: 29 pg (ref 26.0–34.0)
MCHC: 33 g/dL (ref 30.0–36.0)
MCV: 87.8 fL (ref 78.0–100.0)
PLATELETS: 188 10*3/uL (ref 150–400)
RBC: 3.45 MIL/uL — ABNORMAL LOW (ref 3.87–5.11)
RDW: 16.9 % — AB (ref 11.5–15.5)
WBC: 13.7 10*3/uL — ABNORMAL HIGH (ref 4.0–10.5)

## 2013-11-24 LAB — HEMOGLOBIN A1C
Hgb A1c MFr Bld: 7.3 % — ABNORMAL HIGH (ref <5.7)
Mean Plasma Glucose: 163 mg/dL — ABNORMAL HIGH (ref <117)

## 2013-11-24 LAB — GLUCOSE, CAPILLARY
Glucose-Capillary: 122 mg/dL — ABNORMAL HIGH (ref 70–99)
Glucose-Capillary: 177 mg/dL — ABNORMAL HIGH (ref 70–99)
Glucose-Capillary: 154 mg/dL — ABNORMAL HIGH (ref 70–99)

## 2013-11-24 LAB — BASIC METABOLIC PANEL
BUN: 50 mg/dL — ABNORMAL HIGH (ref 6–23)
CO2: 23 meq/L (ref 19–32)
CREATININE: 1.3 mg/dL — AB (ref 0.50–1.10)
Calcium: 7.4 mg/dL — ABNORMAL LOW (ref 8.4–10.5)
Chloride: 105 mEq/L (ref 96–112)
GFR calc non Af Amer: 36 mL/min — ABNORMAL LOW (ref >90)
GFR, EST AFRICAN AMERICAN: 42 mL/min — AB (ref >90)
Glucose, Bld: 159 mg/dL — ABNORMAL HIGH (ref 70–99)
Potassium: 3.4 mEq/L — ABNORMAL LOW (ref 3.7–5.3)
Sodium: 140 mEq/L (ref 137–147)

## 2013-11-24 MED ORDER — NICOTINE 14 MG/24HR TD PT24
14.0000 mg | MEDICATED_PATCH | Freq: Every day | TRANSDERMAL | Status: DC
  Administered 2013-11-25 – 2013-11-27 (×3): 14 mg via TRANSDERMAL
  Filled 2013-11-24 (×4): qty 1

## 2013-11-24 NOTE — Progress Notes (Signed)
BP 79/47, 78/44. Pt resting comfortably. PA notified again. No new orders at this time.   M.Forest Gleason, RN

## 2013-11-24 NOTE — Progress Notes (Signed)
At 1620, [place] flow rate was reduced from 5L to 2L. Pt maintained O2 sats >95%.  At 1820, flow rate was reduced again to 1L [place] and patient is currently maintaining O2 sats > 93%.  Will continue to monitor and titrate in response to patient condition.

## 2013-11-24 NOTE — Progress Notes (Signed)
Thank you for consulting the Palliative Medicine Team at [facility] to meet your patient's and family's needs.   The reason that you asked [place] to see your patient is for Council and options.   We have scheduled your patient for a meeting: 1/27 1300pm  The Surrogate decision make is: Estill Batten (dtr) Contact information: see facesheet  Other family members that need to be present: other children    Your patient is able/unable to participate: unlikely   Vinie Sill, NP Palliative Medicine Team Pager # [phone] Team Phone # [phone]

## 2013-11-24 NOTE — Plan of Care (Signed)
SBP in the 70s thru the night. Have HELD this AM Lopressor and dc'd Nicardipine. Cont IVFs  Erin Hearing, ANP

## 2013-11-24 NOTE — Care Management Note (Signed)
    Page 1 of 1   11/24/2013     9:50:56 AM   CARE MANAGEMENT NOTE 11/24/2013  Patient:  Sara, Beard   Account Number:  [id number]  Date Initiated:  11/24/2013  Documentation initiated by:  Elissa Hefty  Subjective/Objective Assessment:   adm w resp failure,chf     Action/Plan:   lives w fam, pcp dr Gara Kroner todd, act w [facility]   Anticipated DC Date:     Anticipated DC Plan:  [place]  CM consult      [facility] Choice  Resumption Of Svcs/PTA Provider   Choice offered to / List presented to:          [facility] arranged  HH-1 RN  [place].   Status of service:   Medicare Important Message given?   (If response is "NO", the following Medicare IM given date fields will be blank) Date Medicare IM given:   Date Additional Medicare IM given:    Discharge Disposition:  [place]  Per UR Regulation:  Reviewed for med. necessity/level of care/duration of stay  If discussed at [place] of Stay Meetings, dates discussed:    Comments:  1/26 0950 debbie Jamahl Lemmons rn,bsn alerted [facility] debbie of pt's adm.

## 2013-11-24 NOTE — Progress Notes (Signed)
BP 63/35, retake 75/44. HR 60's vpaced. PT A/O, asymptomatic. PA notified. No new orders at this time. Will continue to monitor.   M.Forest Gleason, RN

## 2013-11-24 NOTE — Progress Notes (Signed)
Patient doing well on NRB despite patient continually removing mask off of her face.  When patient was ready to eat her lunch, she was transitioned to 5L [place] and was able to maintain O2 sats >95% while eating.  Patient has remained on [place] and has done well.  Will continue to wean off O2 and monitor the patient's progress.

## 2013-11-24 NOTE — Progress Notes (Signed)
Patient's O2 saturation 81-86% on 5L [place].  Pt was placed on a non-rebreather at 15L (55%) and O2 sats are now being maintained at 89-91%.  Patient appears calm and relaxed. Breathing is regular and unlabored without accessory muscle use.  Will notify the physician and continue to monitor.

## 2013-11-24 NOTE — Progress Notes (Signed)
[name] TEAM 1 - Stepdown/ICU TEAM Progress Note  [name] BDZ:[id number] DOB: [birth year] DOA: 11/21/2013 PCP: Joycelyn Man, MD  Admit HPI / Brief Narrative: 80 y.o. female who was brought to the ED due to increased weakness and fatigue x 1 week continuing to decline since her pacemaker placement 1 week previous to this admit. She had several falls and decreased PO intake. In the ED she was found to be hypoxic and was placed on BIPAP. A chest X-ray was negative for acute findings.   When she was hospitalized previously, hospice services where in the process of being arranged by her PCP.  Her family is currently interested in patient having palliative care services arranged.   HPI/Subjective: Pt is alert and conversant.  She c/o feeling cold.  Despite her sats frequently dropping into the low 80s she denies SOB.  She denies chest pain.    Assessment/Plan:  Acute hypoxic resp failure - probable ES COPD CXR w/o acute findings but c/w air trapping/flattened diaphragms - EF 55-60% via TTE 11/13/13 (unable to comment on DD) - we are having difficulty weaning oxygen as pt frequently intermittently desaturates quite quickly in to to low 80s - family agrees that recurrent use of BIPAP is not appropriate in the setting of an irreversible process - I have asked Palliative Care to assist in disposition and specifics of goals of care   Acute renal failure  crt was 1.0 at time of recent d/c - appears to be slowly improving w/ volume expansion - follow for now as a prognostic indicator - pt noted to have been on 2 diuretics prior to admit per med rec  Hyperkalemia Resolved   Elevated Troponin / NSTEMI w/ known hx of CAD Trop 2.65 at time of admit - EKG showed paced rythmn - had cardiac cath earlier this month > 50-60% stenosis in the mid LAD and circumflex between OM1 and OM 2 with more significant lesion in a small ramus-type marginal branch of the circumflex - widely patent RCA stent but  no significant lesions in the RCA - troponin quickly improving - follow clinically w/ no further intervention planned   CHB s/p Medtronic Adapta L Model ADDR: 1 Pacer 1/16  DM SSI only for now - follow CBG trend   HLD  tx not appropriate at present due to severe malnutrition and poor intake   HTN Not an active problem at this time   Hx of ovarian CA  Underweight - Body mass index is 15.37 kg/(m^2).  Code Status: NO CODE Family Communication: spoke w/ dgtr and son at bedside  Disposition Plan: transfer to med/surg bed after meeting with PC, unless early d/c to [place] is planned   Consultants: Palliative Care   Procedures: none  Antibiotics: Zosyn 1/23 vanc 1/23  DVT prophylaxis: IV heparin >> SCDs  Objective: Blood pressure 77/44, pulse 67, temperature 97.8 F (36.6 C), temperature source Oral, resp. rate 17, height 5' (1.524 m), weight 35.7 kg (78 lb 11.3 oz), SpO2 91.00%.  Intake/Output Summary (Last 24 hours) at 11/24/13 1301 Last data filed at 11/24/13 1200  Gross per 24 hour  Intake   1350 ml  Output    475 ml  Net    875 ml   Exam: General: No acute respiratory distress at rest - cachectic - desaturates quickly w/ slightest movement  Lungs: poor air movement th/o all fields - no focal crackles or wheeze  Cardiovascular: distant HS - RRR - no appreciable M Abdomen:  Nontender, nondistended, soft, bowel sounds positive, no rebound, no ascites, no appreciable mass Extremities: No significant cyanosis, clubbing, or edema bilateral lower extremities  Data Reviewed: Basic Metabolic Panel:  Recent Labs Lab 11/21/13 2250 11/22/13 0815 11/23/13 0845 11/24/13 0245  NA 142 144 143 140  K 5.7* 5.5* 3.5* 3.4*  CL 102 105 105 105  CO2 18* 21 24 23   GLUCOSE 166* 159* 114* 159*  BUN 55* 59* 51* 50*  CREATININE 1.48* 1.57* 1.42* 1.30*  CALCIUM 8.5 8.8 7.7* 7.4*   Liver Function Tests:  Recent Labs Lab 11/21/13 2250 11/23/13 0845  AST 130* 38*  ALT  91* 45*  ALKPHOS 95 60  BILITOT 0.4 0.2*  PROT 6.1 4.7*  ALBUMIN 2.7* 2.1*   CBC:  Recent Labs Lab 11/21/13 2250 11/23/13 0845 11/24/13 0245  WBC 10.7* 10.1 13.7*  NEUTROABS 8.8*  --   --   HGB 14.8 10.1* 10.0*  HCT 44.0 30.6* 30.3*  MCV 88.0 87.9 87.8  PLT 185 160 188   Cardiac Enzymes:  Recent Labs Lab 11/22/13 0815 11/23/13 0845  TROPONINI 2.00* 1.11*   BNP (last 3 results)  Recent Labs  06/26/13 1838 11/21/13 2250  PROBNP 944.8* >70000.0*   CBG:  Recent Labs Lab 11/23/13 1237 11/23/13 1650 11/23/13 2107 11/24/13 0812 11/24/13 1208  GLUCAP 227* 92 197* 122* 177*    Recent Results (from the past 240 hour(s))  MRSA PCR SCREENING     Status: None   Collection Time    11/22/13  2:24 AM      Result Value Range Status   MRSA by PCR NEGATIVE  NEGATIVE Final   Comment:            The GeneXpert MRSA Assay (FDA     approved for NASAL specimens     only), is one component of a     comprehensive MRSA colonization     surveillance program. It is not     intended to diagnose MRSA     infection nor to guide or     monitor treatment for     MRSA infections.     Studies:  Recent x-ray studies have been reviewed in detail by the Attending Physician  Scheduled Meds:  Scheduled Meds: . aspirin  81 mg Oral Daily  . calcium-vitamin D  1 tablet Oral Q breakfast  . insulin aspart  0-9 Units Subcutaneous TID WC  . megestrol  40 mg Oral Daily  . metoprolol tartrate  12.5 mg Oral BID  . mirtazapine  7.5 mg Oral QHS  . multivitamin with minerals  1 tablet Oral Daily  . nicotine  21 mg Transdermal Daily  . omega-3 acid ethyl esters  1 g Oral Daily    Time spent on care of this patient: 35 mins   [place]  [phone] Pager - Text Page per Shea Evans as per below:  On-Call/Text Page:      [email or web address]      password [email or web address]  If 7PM-7AM, please contact night-coverage [email or web address] Password [email or web address] 11/24/2013, 1:01 PM    LOS: 3 days

## 2013-11-25 DIAGNOSIS — N179 Acute kidney failure, unspecified: Secondary | ICD-10-CM

## 2013-11-25 DIAGNOSIS — R531 Weakness: Secondary | ICD-10-CM

## 2013-11-25 DIAGNOSIS — R5381 Other malaise: Secondary | ICD-10-CM

## 2013-11-25 DIAGNOSIS — R5383 Other fatigue: Secondary | ICD-10-CM

## 2013-11-25 DIAGNOSIS — Z515 Encounter for palliative care: Secondary | ICD-10-CM

## 2013-11-25 LAB — GLUCOSE, CAPILLARY
GLUCOSE-CAPILLARY: 83 mg/dL (ref 70–99)
Glucose-Capillary: 141 mg/dL — ABNORMAL HIGH (ref 70–99)
Glucose-Capillary: 154 mg/dL — ABNORMAL HIGH (ref 70–99)
Glucose-Capillary: 153 mg/dL — ABNORMAL HIGH (ref 70–99)

## 2013-11-25 NOTE — Progress Notes (Signed)
TEAM 1 - Stepdown/ICU TEAM Progress Note  SHAMICA MOREE [id number] DOB: Jul 06, 1925 DOA: 11/21/2013 PCP: Joycelyn Man, MD  Admit HPI / Brief Narrative: 80 y.o. female who was brought to the ED due to increased weakness and fatigue x 1 week continuing to decline since her pacemaker placement 1 week previous to this admit. She had several falls and decreased PO intake. In the ED she was found to be hypoxic and was placed on BIPAP. A chest X-ray was negative for acute findings.   When she was hospitalized previously, hospice services where in the process of being arranged by her PCP.  Her family is currently interested in patient having palliative care services arranged.   HPI/Subjective: Pt is alert and conversant.  C/o feeling weak. She is considering going to a SNF but would like to be able to go home with supervision by one of her children if possible. She states her son is coming today to stay for 1 wk.   Assessment/Plan:  Acute hypoxic resp failure - probable ES COPD CXR w/o acute findings but c/w air trapping/flattened diaphragms - EF 55-60% via TTE 11/13/13 (unable to comment on DD) - we are having difficulty weaning oxygen as pt frequently intermittently desaturates quite quickly in to to low 80s - family agrees that recurrent use of BIPAP is not appropriate in the setting of an irreversible process - Palliative Care consult appreciated - plan is for Pt to go to rehab facility  Acute renal failure  crt was 1.0 at time of recent d/c - appears to be slowly improving w/ volume expansion - follow for now as a prognostic indicator - pt noted to have been on 2 diuretics prior to admit per med rec  Hyperkalemia Resolved   Elevated Troponin / NSTEMI w/ known hx of CAD Trop 2.65 at time of admit - EKG showed paced rythmn - had cardiac cath earlier this month > 50-60% stenosis in the mid LAD and circumflex between OM1 and OM 2 with more significant lesion in a small  ramus-type marginal branch of the circumflex - widely patent RCA stent but no significant lesions in the RCA - troponin quickly improving - follow clinically w/ no further intervention planned   CHB s/p Medtronic Adapta L Model ADDR: 1 Pacer 1/16  DM SSI only for now - follow CBG trend   HLD  tx not appropriate at present due to severe malnutrition and poor intake   HTN Not an active problem at this time   Hx of ovarian CA  Underweight - Body mass index is 15.37 kg/(m^2).  Code Status: NO CODE Family Communication:  Dr Dorothy Puffer spoke w/ dgtr and son at bedside on 1/26 Disposition Plan: transfer to med/surg bed today  Consultants: Palliative Care   Procedures: none  Antibiotics: Zosyn 1/23 vanc 1/23  DVT prophylaxis: IV heparin >> SCDs  Objective: Blood pressure 104/52, pulse 61, temperature 98 F (36.7 C), temperature source Oral, resp. rate 21, height 5' (1.524 m), weight 35.7 kg (78 lb 11.3 oz), SpO2 100.00%.  Intake/Output Summary (Last 24 hours) at 11/25/13 1706 Last data filed at 11/25/13 1231  Gross per 24 hour  Intake      0 ml  Output    800 ml  Net   -800 ml   Exam: General: No acute respiratory distress at rest - cachectic - desaturates quickly w/ slightest movement - tremor of head and shoulders Lungs: poor air movement th/o all fields - no focal  crackles or wheeze  Cardiovascular: distant HS - RRR - no appreciable M Abdomen: Nontender, nondistended, soft, bowel sounds positive, no rebound, no ascites, no appreciable mass Extremities: No significant cyanosis, clubbing, or edema bilateral lower extremities  Data Reviewed: Basic Metabolic Panel:  Recent Labs Lab 11/21/13 2250 11/22/13 0815 11/23/13 0845 11/24/13 0245  NA 142 144 143 140  K 5.7* 5.5* 3.5* 3.4*  CL 102 105 105 105  CO2 18* 21 24 23   GLUCOSE 166* 159* 114* 159*  BUN 55* 59* 51* 50*  CREATININE 1.48* 1.57* 1.42* 1.30*  CALCIUM 8.5 8.8 7.7* 7.4*   Liver Function Tests:  Recent  Labs Lab 11/21/13 2250 11/23/13 0845  AST 130* 38*  ALT 91* 45*  ALKPHOS 95 60  BILITOT 0.4 0.2*  PROT 6.1 4.7*  ALBUMIN 2.7* 2.1*   CBC:  Recent Labs Lab 11/21/13 2250 11/23/13 0845 11/24/13 0245  WBC 10.7* 10.1 13.7*  NEUTROABS 8.8*  --   --   HGB 14.8 10.1* 10.0*  HCT 44.0 30.6* 30.3*  MCV 88.0 87.9 87.8  PLT 185 160 188   Cardiac Enzymes:  Recent Labs Lab 11/22/13 0815 11/23/13 0845  TROPONINI 2.00* 1.11*   BNP (last 3 results)  Recent Labs  06/26/13 1838 11/21/13 2250  PROBNP 944.8* >70000.0*   CBG:  Recent Labs Lab 11/24/13 1208 11/24/13 1753 11/24/13 2048 11/25/13 0750 11/25/13 1228  GLUCAP 177* 141* 154* 83 154*    Recent Results (from the past 240 hour(s))  MRSA PCR SCREENING     Status: None   Collection Time    11/22/13  2:24 AM      Result Value Range Status   MRSA by PCR NEGATIVE  NEGATIVE Final   Comment:            The GeneXpert MRSA Assay (FDA     approved for NASAL specimens     only), is one component of a     comprehensive MRSA colonization     surveillance program. It is not     intended to diagnose MRSA     infection nor to guide or     monitor treatment for     MRSA infections.     Studies:  Recent x-ray studies have been reviewed in detail by the Attending Physician  Scheduled Meds:  Scheduled Meds: . insulin aspart  0-9 Units Subcutaneous TID WC  . metoprolol tartrate  12.5 mg Oral BID  . mirtazapine  7.5 mg Oral QHS  . nicotine  14 mg Transdermal Daily    Time spent on care of this patient: 35 mins   Debbe Odea, MD  [facility] Office  [phone] Pager - Text Page per Shea Evans as per below:  On-Call/Text Page:      [email or web address]      password [email or web address]  If 7PM-7AM, please contact night-coverage [email or web address] Password [email or web address] 11/25/2013, 5:06 PM   LOS: 4 days

## 2013-11-25 NOTE — Consult Note (Signed)
Patient XB:MWUXLKG [name]      DOB: 1924/11/16      MWN:[id number]     Consult Note from the Palliative Medicine Team at [facility] Requested by: Dr Wynelle Cleveland     PCP: Joycelyn Man, MD Reason for Consultation: Clarification of [name] and options    Phone Number:[phone]  Assessment of patients Current state: [name] is a 80 y.o. female who was brought to the ED due to increased weakness and fatigue x 1 week continuing to decline since her pacemaker placement 1 week ago. She has had several fall and has had a decreased PO intake. In the ED she was found to be hypoxic and was placed on BIPAP, and improved. A chest X-ray was performed and and was negative for acute findings. She was referred for medical admission.   Patient and family faced with advanced care and anticipatory care decisions.   This NP Wadie Lessen reviewed medical records, received report from team, assessed the patient and then meet at the patient's bedside along with her daughter [name] and son [name]  to discuss diagnosis prognosis, [place], EOL wishes disposition and options.   A detailed discussion was had today regarding advanced directives.  Concepts specific to code status, artifical feeding and hydration, continued IV antibiotics and rehospitalization was had.  The difference between a aggressive medical intervention path  and a palliative comfort care path for this patient at this time was had.  Values and goals of care important to patient and family were attempted to be elicited.  Concept of Hospice and Palliative Care were discussed  Natural trajectory and expectations at EOL were discussed.  Questions and concerns addressed.  Hard Choices booklet left for review. Family encouraged to call with questions or concerns.  PMT will continue to support holistically.    Goals of Care: 1.  Code Status: DNR/DNI   2. Scope of Treatment: 1. Vital Signs: per unit  2. Respiratory/Oxygen:as  needed for treatment 3. Nutritional Support/Tube Feeds:no artificial feeding now or in the future 4. Antibiotics: yes 5. Blood Products:yes 6. IVF:yes 7. Review of Medications to be discontinued:continue prestn medication s in hopes of improvemtn 8. Labs:yes 9. Telemetry:yes    4. Disposition:  Patient expresses desire to dc to SNF for rehabilitation with hope of returning home after improvemnt   3. Symptom Management:   1. Anxiety/Agitation: Ativan 0.5 mg po every 8 hrs prn 2. Pain: Oxycodone 5 mg po every 4 hrs prn 3. Weakness: PT/OT consulted, SNF for rehab on dc  4. Psychosocial:  Emotional support to all.  Daughter lives here in [place]  and is the main caregiver, son lives in [place].  Both patient and daughter verbalize the strain the past six months has been on their releationship  5. Spiritual:  [employer] support    Patient Documents Completed or Given: Document Given Completed  Advanced Directives Pkt    MOST yes   DNR    Gone from [facility]    Hard Choices yes     Brief QIH:[id number] [name] is a 80 y.o. female who was brought to the ED due to increased weakness and fatigue x 1 week continuing to decline since her pacemaker placement 1 week ago. She has had several fall and has had a decreased PO intake. In the ED she was found to be hypoxic and was placed on BIPAP, and improved. A chest X-ray was performed and and was negative for acute  findings. She was referred for medical admission.    ROS: weakness, fatigue, weight loss    PMH:  Past Medical History  Diagnosis Date  . CAD (coronary artery disease)     prior stent to the LAD and RCA;  Last [facility] 9/05:  ostial LAD 25%, mid 25%, ostial diagonal 30%, proximal circumflex 25%, ostial OM1 25%, mid RCA 99% in-stent restenosis, then 50 and 40%.  PCI: Taxus DES to the mid RCA.  Last Myoview 5/12: EF 69%, inferior scar with minimal peri-infarct ischemia.  Last echo 2/03: EF 45-55%.  . Depression   . DM  (diabetes mellitus)   . Hyperlipidemia   . Colon polyp   . Hyperplastic rectal polyp   . Ovarian cancer   . Cataract   . Bowel obstruction   . HTN (hypertension)   . Carotid stenosis     s/p right CEA 2000;  Dopplers 10/12: RICA 40-59%, right CEA okay, LICA 604%-[id number] one year     PSH: Past Surgical History  Procedure Laterality Date  . Angioplasty    . Abdominal hysterectomy     I have reviewed the FH and SH and  If appropriate update it with new information. No Known Allergies Scheduled Meds: . insulin aspart  0-9 Units Subcutaneous TID WC  . metoprolol tartrate  12.5 mg Oral BID  . mirtazapine  7.5 mg Oral QHS  . nicotine  14 mg Transdermal Daily   Continuous Infusions:  PRN Meds:.acetaminophen, acetaminophen, alum & mag hydroxide-simeth, HYDROmorphone (DILAUDID) injection, LORazepam, nitroGLYCERIN, ondansetron (ZOFRAN) IV, ondansetron, oxyCODONE, zolpidem    BP 104/52  Pulse 61  Temp(Src) 98 F (36.7 C) (Oral)  Resp 21  Ht 5' (1.524 m)  Wt 35.7 kg (78 lb 11.3 oz)  BMI 15.37 kg/m2  SpO2 100%   PPS: 30 %   Intake/Output Summary (Last 24 hours) at 11/25/13 1441 Last data filed at 11/25/13 1231  Gross per 24 hour  Intake      0 ml  Output    800 ml  Net   -800 ml    Physical Exam:  General: frail, thin, elderly white female HEENT:  + temporal muscle wasting, mm,no exudate noted Chest:   CTA CVS: RRR Abdomen: soft NT +BS Ext: BLE +1 edema, when dependant cool to touch and dusky Neuro: alert and oriented X3  Labs: CBC    Component Value Date/Time   WBC 13.7* 11/24/2013 0245   WBC 10.1* 09/10/2007 1033   RBC 3.45* 11/24/2013 0245   RBC 3.75* 05/30/2009 0327   RBC 3.74 09/10/2007 1033   HGB 10.0* 11/24/2013 0245   HGB 11.6 09/10/2007 1033   HCT 30.3* 11/24/2013 0245   HCT 32.8* 09/10/2007 1033   PLT 188 11/24/2013 0245   PLT 236 09/10/2007 1033   MCV 87.8 11/24/2013 0245   MCV 87.7 09/10/2007 1033   MCH 29.0 11/24/2013 0245   MCH 30.9 09/10/2007  1033   MCHC 33.0 11/24/2013 0245   MCHC 35.3 09/10/2007 1033   RDW 16.9* 11/24/2013 0245   RDW 12.7 09/10/2007 1033   LYMPHSABS 1.0 11/21/2013 2250   LYMPHSABS 2.0 09/10/2007 1033   MONOABS 0.8 11/21/2013 2250   MONOABS 0.6 09/10/2007 1033   EOSABS 0.0 11/21/2013 2250   EOSABS 0.4 09/10/2007 1033   BASOSABS 0.0 11/21/2013 2250   BASOSABS 0.1 09/10/2007 1033    BMET    Component Value Date/Time   NA 140 11/24/2013 0245   K 3.4* 11/24/2013 0245   CL 105  11/24/2013 0245   CO2 23 11/24/2013 0245   GLUCOSE 159* 11/24/2013 0245   BUN 50* 11/24/2013 0245   CREATININE 1.30* 11/24/2013 0245   CALCIUM 7.4* 11/24/2013 0245   GFRNONAA 36* 11/24/2013 0245   GFRAA 42* 11/24/2013 0245    CMP     Component Value Date/Time   NA 140 11/24/2013 0245   K 3.4* 11/24/2013 0245   CL 105 11/24/2013 0245   CO2 23 11/24/2013 0245   GLUCOSE 159* 11/24/2013 0245   BUN 50* 11/24/2013 0245   CREATININE 1.30* 11/24/2013 0245   CALCIUM 7.4* 11/24/2013 0245   PROT 4.7* 11/23/2013 0845   ALBUMIN 2.1* 11/23/2013 0845   AST 38* 11/23/2013 0845   ALT 45* 11/23/2013 0845   ALKPHOS 60 11/23/2013 0845   BILITOT 0.2* 11/23/2013 0845   GFRNONAA 36* 11/24/2013 0245   GFRAA 42* 11/24/2013 0245     Time In Time Out Total Time Spent with Patient Total Overall Time  1315 1430 70 min 75 min    Greater than 50%  of this time was spent counseling and coordinating care related to the above assessment and plan.  PMT will continue to support holistically  Lorinda CreedMary Kersten Salmons NP  Palliative Medicine Team Team Phone # [phone] Pager [phone]  Discussed with Dr Butler Denmarkizwan

## 2013-11-25 NOTE — Progress Notes (Signed)
Pt transferred on monitor via bed to 6E13. Accompanied by family who assisted with bringing pt's belongings. Medications given to receiving RN.

## 2013-11-26 LAB — GLUCOSE, CAPILLARY
GLUCOSE-CAPILLARY: 152 mg/dL — AB (ref 70–99)
GLUCOSE-CAPILLARY: 131 mg/dL — AB (ref 70–99)
Glucose-Capillary: 162 mg/dL — ABNORMAL HIGH (ref 70–99)
Glucose-Capillary: 189 mg/dL — ABNORMAL HIGH (ref 70–99)
Glucose-Capillary: 163 mg/dL — ABNORMAL HIGH (ref 70–99)
Glucose-Capillary: 85 mg/dL (ref 70–99)

## 2013-11-26 LAB — BASIC METABOLIC PANEL
BUN: 37 mg/dL — AB (ref 6–23)
CALCIUM: 8.2 mg/dL — AB (ref 8.4–10.5)
CO2: 21 mEq/L (ref 19–32)
Chloride: 107 mEq/L (ref 96–112)
Creatinine, Ser: 0.96 mg/dL (ref 0.50–1.10)
GFR calc Af Amer: 60 mL/min — ABNORMAL LOW (ref >90)
GFR, EST NON AFRICAN AMERICAN: 52 mL/min — AB (ref >90)
GLUCOSE: 131 mg/dL — AB (ref 70–99)
Potassium: 4.2 mEq/L (ref 3.7–5.3)
SODIUM: 143 meq/L (ref 137–147)

## 2013-11-26 MED ORDER — HALOPERIDOL 0.5 MG PO TABS
0.5000 mg | ORAL_TABLET | Freq: Three times a day (TID) | ORAL | Status: DC | PRN
  Administered 2013-11-26: 0.5 mg via ORAL
  Filled 2013-11-26: qty 1

## 2013-11-26 MED ORDER — ALBUTEROL SULFATE (2.5 MG/3ML) 0.083% IN NEBU: 2.5000 mg | INHALATION_SOLUTION | RESPIRATORY_TRACT | Status: DC | PRN

## 2013-11-26 MED ORDER — TRAZODONE 25 MG HALF TABLET
25.0000 mg | ORAL_TABLET | Freq: Every evening | ORAL | Status: DC | PRN
Start: 2013-11-26 — End: 2013-11-27
  Filled 2013-11-26: qty 1

## 2013-11-26 NOTE — Evaluation (Signed)
Occupational Therapy Evaluation Patient Details Name: [name] MRN: [id number] DOB: 1924/11/20 Today's Date: 11/26/2013 Time: 5956-3875 OT Time Calculation (min): 19 min  OT Assessment / Plan / Recommendation History of present illness 80 y.o. female who was brought to the ED due to increased weakness and fatigue x 1 week continuing to decline since her pacemaker placement 1 week previous to this admit. She had several falls and decreased PO intake. In the ED she was found to be hypoxic and was placed on BIPAP. A chest X-ray was negative for acute findings.    Clinical Impression   Pt demos decline in function with ADLs and ADL mobility safety with decreased strength, balance and endurance. Pt would benefit from acute OT services to address impairmentd to increase level of function and safety    OT Assessment  Patient needs continued OT Services    Follow Up Recommendations  SNF;Supervision/Assistance - 24 hour    Barriers to Discharge Decreased caregiver support    Equipment Recommendations  None recommended by OT;Other (comment) (TBD at next venue of care)    Recommendations for Other Services    Frequency  Min 2X/week    Precautions / Restrictions Precautions Precautions: Fall;ICD/Pacemaker Precaution Comments: pt reports a recent fall Restrictions Weight Bearing Restrictions: No   Pertinent Vitals/Pain No c/o pain    ADL  Grooming: Performed;Wash/dry hands;Wash/dry face;Supervision/safety;Set up Where Assessed - Grooming: Unsupported sitting Upper Body Bathing: Simulated;Supervision/safety;Set up Where Assessed - Upper Body Bathing: Unsupported sitting Lower Body Bathing: Simulated;Moderate assistance Upper Body Dressing: Performed;Supervision/safety;Set up Where Assessed - Upper Body Dressing: Unsupported sitting Lower Body Dressing: Performed;Maximal assistance Toilet Transfer: Performed;Minimal assistance Toilet Transfer Method: Sit to stand Toilet  Transfer Equipment: Bedside commode Toileting - Clothing Manipulation and Hygiene: Performed;Minimal assistance Where Assessed - Camera operator Manipulation and Hygiene: Standing Tub/Shower Transfer Method: Not assessed Equipment Used: Gait belt;Other (comment) (BSC) Transfers/Ambulation Related to ADLs: pt required increased (A) to rise to stand from bed and BSC. pt unsteady with transfers; cues for hand placement and transfers. Pt became fearful and declined to ambulate into bathroom after taking 5 steps from bed    OT Diagnosis: Generalized weakness  OT Problem List: Decreased strength;Decreased knowledge of use of DME or AE;Decreased activity tolerance;Impaired balance (sitting and/or standing);Decreased safety awareness;Decreased knowledge of precautions OT Treatment Interventions: Self-care/ADL training;Therapeutic exercise;Patient/family education;Balance training;Neuromuscular education;Therapeutic activities;DME and/or AE instruction   OT Goals(Current goals can be found in the care plan section) Acute Rehab OT Goals Patient Stated Goal: to go someplace for rehab and then go home OT Goal Formulation: With patient/family Time For Goal Achievement: 12/03/13 Potential to Achieve Goals: Good ADL Goals Pt Will Perform Grooming: with min guard assist;standing Pt Will Perform Upper Body Bathing: with set-up;sitting Pt Will Perform Lower Body Bathing: with min assist;sitting/lateral leans;sit to/from stand Pt Will Perform Upper Body Dressing: with set-up;sitting Pt Will Transfer to Toilet: with min guard assist;bedside commode;grab bars;ambulating;regular height toilet Pt Will Perform Toileting - Clothing Manipulation and hygiene: with min guard assist;with supervision;sit to/from stand  Visit Information  Last OT Received On: 11/26/13 Assistance Needed: +1 History of Present Illness: 79 y.o. female who was brought to the ED due to increased weakness and fatigue x 1 week continuing  to decline since her pacemaker placement 1 week previous to this admit. She had several falls and decreased PO intake. In the ED she was found to be hypoxic and was placed on BIPAP. A chest X-ray was negative for acute findings.  Prior [place] expects to be discharged to:: Private residence Living Arrangements: Children Available Help at Discharge: Family;Available 24 hours/day Type of Home: House Home Access: Stairs to enter;Ramped entrance Entrance Stairs-Number of Steps: 3, ramped entrance in back Entrance Stairs-Rails: Right Home Layout: Two level;Laundry or work area in basement;Able to live on main level with bedroom/bathroom Alternate Therapist, sports of Steps: stays on main Home Equipment: Kasandra Knudsen - single point Additional Comments: pt reports her son is flying in from [place] to help take care of her; refusing SNF at this time  Prior Function Level of Independence: Needs assistance Gait / Transfers Assistance Needed: pt reports she was ambulating without any AD ADL's / Homemaking Assistance Needed: daughter assists with homekeeping, showering, food, etc Comments: pt reports that she drives and goes to the grocery store independently  Communication Communication: No difficulties Dominant Hand: Right         Vision/Perception Vision - History Baseline Vision: Wears glasses only for reading Patient Visual Report: No change from baseline Perception Perception: Within Functional Limits   Cognition  Cognition Arousal/Alertness: Awake/alert Behavior During Therapy: WFL for tasks assessed/performed Overall Cognitive Status: Impaired/Different from baseline Area of Impairment: Orientation;Following commands;Safety/judgement;Problem solving Memory: Decreased short-term memory Following Commands: Follows one step commands consistently;Follows one step commands with increased time Safety/Judgement: Decreased awareness of  deficits;Decreased awareness of safety Problem Solving: Slow processing;Difficulty sequencing;Requires verbal cues;Requires tactile cues    Extremity/Trunk Assessment Upper Extremity Assessment Upper Extremity Assessment: Overall WFL for tasks assessed;Generalized weakness Lower Extremity Assessment Lower Extremity Assessment: Defer to PT evaluation Cervical / Trunk Assessment Cervical / Trunk Assessment: Kyphotic     Mobility Bed Mobility Overal bed mobility: Modified Independent Bed Mobility: Supine to Sit Supine to sit: HOB elevated;Modified independent (Device/Increase time) General bed mobility comments: HOB was elevated; no physical (A) needed;  Transfers Overall transfer level: Needs assistance Equipment used: 1 person hand held assist Transfers: Sit to/from Stand Sit to Stand: Min assist General transfer comment: pt required increased (A) to rise to stand from bed and BSC. pt unsteady with transfers; cues for hand placement and transfers           Balance Balance Overall balance assessment: History of Falls;Needs assistance Sitting-balance support: No upper extremity supported;Feet supported Sitting balance-Leahy Scale: Good Standing balance support: Bilateral upper extremity supported;Single extremity supported;During functional activity Standing balance-Leahy Scale: Poor   End of Session OT - End of Session Equipment Utilized During Treatment: Gait belt;Other (comment) (BSC) Activity Tolerance: Patient limited by fatigue Patient left: in bed;with call bell/phone within reach;with bed alarm set;with family/visitor present  GO     Britt Bottom 11/26/2013, 2:03 PM

## 2013-11-26 NOTE — Progress Notes (Addendum)
[facility] PROGRESS NOTE  [name] PNT:[id number] DOB: July 28, 1925 DOA: 11/21/2013 PCP: Joycelyn Man, MD   Brief Narrative:  80 y.o. female who was brought to the ED due to increased weakness and fatigue x 1 week continuing to decline since her pacemaker placement 1 week previous to this admit. She had several falls and decreased PO intake. In the ED she was found to be hypoxic and was placed on BIPAP. A chest X-ray was negative for acute findings.  When she was hospitalized previously, hospice services where in the process of being arranged by her PCP. Her family is currently interested in patient having palliative care services arranged.   Assessment/Plan:  1. 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 Acute renal failure  crt was 1.0 at time of recent d/c - appears to be slowly improving w/ volume expansion - follow for now as a prognostic indicator - pt noted to have been on 2 diuretics prior to admit per med rec  3. Hyperkalemia  Resolved  4. Elevated Troponin / NSTEMI w/ known hx of CAD  Trop 2.65 at time of admit - EKG showed paced rythmn - had cardiac cath earlier this month > 50-60% stenosis in the mid LAD and circumflex between OM1 and OM 2 with more significant lesion in a small ramus-type marginal branch of the circumflex - widely patent RCA stent but no significant lesions in the RCA - troponin quickly improving - follow clinically w/ no further intervention planned  5. CHB s/p Medtronic Adapta L Model ADDR: 1 Pacer 1/16  6. DM  SSI only for  now - follow CBG trend  7. HLD  tx not appropriate at present due to severe malnutrition and poor intake  8. HTN  Not an active problem at this time  9. Hx of ovarian CA  10. Underweight - Body mass index is 15.37 kg/(m^2).   Code Status: DNR Family Communication: d/w patient; called Gray,[place] Daughter [phone] No answer; try later  (indicate person spoken with, relationship, and if by phone, the number) Disposition Plan: SNF  Palliative Care  Procedures:  none  Antibiotics:  Zosyn 1/23  vanc 1/23  HPI/Subjective: alert  Objective: Filed Vitals:   11/26/13 0900  BP: 134/50  Pulse: 78  Temp: 97.7 F (36.5 C)  Resp: 20    Intake/Output Summary (Last 24 hours) at 11/26/13 1014 Last data filed at 11/25/13 2248  Gross per 24 hour  Intake      0 ml  Output    350 ml  Net   -350 ml   Filed Weights   11/24/13 0830 11/25/13 0432 11/25/13 2053  Weight: 35.7 kg (78 lb 11.3 oz) 35.7 kg (78 lb 11.3 oz) 36.333 kg (80 lb 1.6 oz)    Exam:   General:  alert  Cardiovascular: s1,s2 rrr  Respiratory: few wheezing   Abdomen: soft, nt, nd   Musculoskeletal: no le edema   Data Reviewed: Basic Metabolic Panel:  Recent Labs Lab 11/21/13 2250 11/22/13 0815 11/23/13 0845 11/24/13 0245 11/26/13  0715  NA 142 144 143 140 143  K 5.7* 5.5* 3.5* 3.4* 4.2  CL 102 105 105 105 107  CO2 18* 21 24 23 21   GLUCOSE 166* 159* 114* 159* 131*  BUN 55* 59* 51* 50* 37*  CREATININE 1.48* 1.57* 1.42* 1.30* 0.96  CALCIUM 8.5 8.8 7.7* 7.4* 8.2*   Liver Function Tests:  Recent Labs Lab 11/21/13 2250 11/23/13 0845  AST 130* 38*  ALT 91* 45*  ALKPHOS 95 60  BILITOT 0.4 0.2*  PROT 6.1 4.7*  ALBUMIN 2.7* 2.1*   No results found for this basename: LIPASE, AMYLASE,  in the last 168 hours No results found for this basename: AMMONIA,  in the last 168 hours CBC:  Recent Labs Lab 11/21/13 2250 11/23/13 0845 11/24/13 0245  WBC 10.7* 10.1 13.7*   NEUTROABS 8.8*  --   --   HGB 14.8 10.1* 10.0*  HCT 44.0 30.6* 30.3*  MCV 88.0 87.9 87.8  PLT 185 160 188   Cardiac Enzymes:  Recent Labs Lab 11/22/13 0815 11/23/13 0845  TROPONINI 2.00* 1.11*   BNP (last 3 results)  Recent Labs  06/26/13 1838 11/21/13 2250  PROBNP 944.8* >70000.0*   CBG:  Recent Labs Lab 11/25/13 0750 11/25/13 1228 11/25/13 1729 11/25/13 2046 11/26/13 0903  GLUCAP 83 154* 162* 153* 152*    Recent Results (from the past 240 hour(s))  MRSA PCR SCREENING     Status: None   Collection Time    11/22/13  2:24 AM      Result Value Range Status   MRSA by PCR NEGATIVE  NEGATIVE Final   Comment:            The GeneXpert MRSA Assay (FDA     approved for NASAL specimens     only), is one component of a     comprehensive MRSA colonization     surveillance program. It is not     intended to diagnose MRSA     infection nor to guide or     monitor treatment for     MRSA infections.     Studies: No results found.  Scheduled Meds: . insulin aspart  0-9 Units Subcutaneous TID WC  . metoprolol tartrate  12.5 mg Oral BID  . mirtazapine  7.5 mg Oral QHS  . nicotine  14 mg Transdermal Daily   Continuous Infusions:   Principal Problem:   Respiratory failure Active Problems:   HYPERLIPIDEMIA   HYPERTENSION   Tobacco abuse   CAD (coronary artery disease)   Hyperkalemia   Elevated troponin   Hypoxia   Protein-calorie malnutrition, severe   Palliative care encounter   Weakness generalized    Time spent: >35 minutes     Kinnie Feil  [facility] Pager [phone]. If 7PM-7AM, please contact night-coverage at [email or web address], password [facility] 11/26/2013, 10:14 AM  LOS: 5 days

## 2013-11-26 NOTE — Clinical Social Work Note (Signed)
Patient assessed (full assessment to follow) and faxed out. Bed offers provided to family and CSW will get decision on Thursday, 1/29 from son Arella Blinder or daughter Estill Batten.  Karthikeya Funke Givens, MSW, LCSW [phone]

## 2013-11-26 NOTE — Progress Notes (Signed)
11/26/13 1545  What Happened  Was fall witnessed? No  Who witnessed fall? (Unwitnessed, seen after fall)  Patients activity before fall other (comment) (In bed)  Point of contact back  Was patient injured? No  Patient found in hallway;other (Comment) (At the front patient's door)  Found by Staff-comment  Stated prior activity other (comment) (patient stated she thought she was at home and was getting t)  Follow Up  MD notified Yes  Time MD notified 50  Family notified Yes-comment  Time family notified 1610 (Was in the process of calling but daughter called.)  Simple treatment Other (comment) (no injury assessed)  Fall Risk Assessment  Age 80  Fall History: Fall within 6 months prior to admission 5  Elimination; Bowel and/or Urine Incontinence 0  Elimination; Bowel and/or Urine Urgency/Frequency 0  Medications: includes PCA/Opiates, Anti-convulsants, Anti-hypertensives, Diuretics, Hypnotics, Laxatives, Sedatives, and Psychotropics 5  Patient Care Equipment 2  Mobility-Assistance 2  Mobility-Gait 2  Mobility-Sensory Deficit 0  Cognition-Awareness 0  Cognition-Impulsiveness 0  Cognition-Limitations 0  Total Score 19  Patient's Fall Risk High Fall Risk (>13 points)  Fall Risk Interventions  Required Bundle Interventions *See Row Information* High fall risk - low, moderate, and high requirements implemented  Neurological  Neuro (WDL) X  Level of Consciousness Alert  Orientation Level Oriented to person;Disoriented to place;Disoriented to time;Disoriented to situation  Speech Clear  Glasgow Coma Scale  Eye Opening 4  Best Verbal Response 4  Modified Verbal Response 3  Best Motor Response 6  Glasgow Coma Scale Score ! 17  Musculoskeletal  Musculoskeletal (WDL) X  Assistive Device BSC;Standard walker  Generalized Weakness Yes  Weight Bearing Restrictions No  Integumentary  Integumentary (WDL) X  Skin Color Appropriate for ethnicity  Skin Integrity Ecchymosis   Ecchymosis Location Arm;Leg;Chest (informed about presence of ecchymosis during hand-of report)

## 2013-11-26 NOTE — Evaluation (Addendum)
Physical Therapy Evaluation Patient Details Name: [name] MRN: [id number] DOB: April 07, 1925 Today's Date: 11/26/2013 Time: 1638-4665 PT Time Calculation (min): 19 min  PT Assessment / Plan / Recommendation History of Present Illness  80 y.o. female who was brought to the ED due to increased weakness and fatigue x 1 week continuing to decline since her pacemaker placement 1 week previous to this admit. She had several falls and decreased PO intake. In the ED she was found to be hypoxic and was placed on BIPAP. A chest X-ray was negative for acute findings.   Clinical Impression  Pt adm due to the above. Pt presents with limitations in functional mobility secondary to deficits indicated below. Pt is a high fall risk due to balance deficits and cognition deficits. Pt to benefit from skilled acute PT to address deficits listed below (see PT problem list). Pt will require 24/7 (A) upon acute D/C. Pt is refusing SNF at this time. Will recommend HHPT.     PT Assessment  Patient needs continued PT services    Follow Up Recommendations  Home health PT;Supervision/Assistance - 24 hour (pt refusing SNF at this time, SNF would be safest plan; reports she has 24/7 (A) )    Does the patient have the potential to tolerate intense rehabilitation      Barriers to Discharge Decreased caregiver support pt lives alone    Equipment Recommendations  None recommended by PT    Recommendations for Other Services OT consult   Frequency Min 3X/week    Precautions / Restrictions Precautions Precautions: Fall;ICD/Pacemaker Precaution Comments: pt reports a recent fall Restrictions Weight Bearing Restrictions: No   Pertinent Vitals/Pain On 2L o2; denies any pain      Mobility  Bed Mobility Overal bed mobility: Modified Independent Bed Mobility: Supine to Sit Supine to sit: HOB elevated;Modified independent (Device/Increase time) General bed mobility comments: HOB was elevated; no physical (A)  needed;  Transfers Overall transfer level: Needs assistance Equipment used: 1 person hand held assist Transfers: Sit to/from Stand Sit to Stand: Mod assist General transfer comment: pt required increased (A) to rise to stand from standard toilet; pt unsteady with transfers; cues for hand placement and transfers  Ambulation/Gait Ambulation/Gait assistance: Min assist Ambulation Distance (Feet): 20 Feet Assistive device: 1 person hand held assist Gait Pattern/deviations: Step-through pattern;Decreased stride length;Shuffle;Narrow base of support Gait velocity: decreased Gait velocity interpretation: <1.8 ft/sec, indicative of risk for recurrent falls General Gait Details: assist for balance; pt unsteady with gt; reaching for bil UE support; pt became off balance multiple times and requries (A) to maintain balance    Exercises General Exercises - Lower Extremity Ankle Circles/Pumps: AROM;Both;10 reps;Seated (to reduce LE edema )   PT Diagnosis: Abnormality of gait;Generalized weakness  PT Problem List: Decreased strength;Decreased activity tolerance;Decreased balance;Decreased mobility;Decreased cognition;Decreased knowledge of use of DME;Decreased safety awareness PT Treatment Interventions: DME instruction;Balance training;Gait training;Stair training;Functional mobility training;Patient/family education;Therapeutic exercise;Therapeutic activities     PT Goals(Current goals can be found in the care plan section) Acute Rehab PT Goals Patient Stated Goal: to go home and not to a center  PT Goal Formulation: With patient Time For Goal Achievement: 12/10/13 Potential to Achieve Goals: Good  Visit Information  Last PT Received On: 11/26/13 Assistance Needed: +1 History of Present Illness: 80 y.o. female who was brought to the ED due to increased weakness and fatigue x 1 week continuing to decline since her pacemaker placement 1 week previous to this admit. She had several falls and  decreased PO intake. In the ED she was found to be hypoxic and was placed on BIPAP. A chest X-ray was negative for acute findings.        Prior [place] expects to be discharged to:: Private residence Living Arrangements: Children Available Help at Discharge: Family;Available 24 hours/day Type of Home: House Home Access: Stairs to enter [employer] of Steps: 3 Entrance Stairs-Rails: Right Home Layout: Two level;Laundry or work area in basement;Able to live on main level with bedroom/bathroom Alternate Therapist, sports of Steps: stays on main Additional Comments: pt reports her son is flying in from [place] to help take care of her; refusing SNF at this time  Prior Function Level of Independence: Needs assistance Gait / Transfers Assistance Needed: pt reports she was ambulating without any AD ADL's / Homemaking Assistance Needed: daughter assists with homekeeping, showering, food, etc Comments: pt reports that she drives and goes to the grocery store independently  Communication Communication: No difficulties    Cognition  Cognition Arousal/Alertness: Awake/alert Behavior During Therapy: WFL for tasks assessed/performed Overall Cognitive Status: Impaired/Different from baseline Area of Impairment: Orientation;Following commands;Safety/judgement;Problem solving Orientation Level: Disoriented to;Situation Memory: Decreased short-term memory Following Commands: Follows one step commands consistently;Follows one step commands with increased time Safety/Judgement: Decreased awareness of deficits;Decreased awareness of safety Problem Solving: Slow processing;Difficulty sequencing;Requires verbal cues;Requires tactile cues General Comments: no family present to determine baseline cognition    Extremity/Trunk Assessment Upper Extremity Assessment Upper Extremity Assessment: Defer to OT evaluation Lower Extremity Assessment Lower Extremity  Assessment: Generalized weakness Cervical / Trunk Assessment Cervical / Trunk Assessment: Kyphotic   Balance Balance Overall balance assessment: History of Falls;Needs assistance Sitting-balance support: Feet supported;No upper extremity supported Sitting balance-Leahy Scale: Good Standing balance support: During functional activity;Single extremity supported Standing balance-Leahy Scale: Poor General Comments General comments (skin integrity, edema, etc.): Lt LE edema noted   End of Session PT - End of Session Equipment Utilized During Treatment: Gait belt Activity Tolerance: Patient limited by fatigue Patient left: in chair;with call bell/phone within reach;with chair alarm set Nurse Communication: Mobility status;Precautions  GP     Gustavus Bryant, [place] 11/26/2013, 9:44 AM

## 2013-11-26 NOTE — Progress Notes (Signed)
Progress Note from the Palliative Medicine Team at [facility]:   patient is alert and oriented, sitting up in bed NAD  -continued conversation regarding realistic anticipated care needs and safety, patient willing to "try" a SNF for rehab    Objective: No Known Allergies Scheduled Meds: . insulin aspart  0-9 Units Subcutaneous TID WC  . metoprolol tartrate  12.5 mg Oral BID  . mirtazapine  7.5 mg Oral QHS  . nicotine  14 mg Transdermal Daily   Continuous Infusions:  PRN Meds:.acetaminophen, acetaminophen, albuterol, alum & mag hydroxide-simeth, HYDROmorphone (DILAUDID) injection, LORazepam, nitroGLYCERIN, ondansetron (ZOFRAN) IV, ondansetron, oxyCODONE, zolpidem  BP 134/50  Pulse 78  Temp(Src) 97.7 F (36.5 C) (Oral)  Resp 20  Ht 5' (1.524 m)  Wt 36.333 kg (80 lb 1.6 oz)  BMI 15.64 kg/m2  SpO2 94%   PPS:30%  Pain Score:denies   Intake/Output Summary (Last 24 hours) at 11/26/13 1335 Last data filed at 11/25/13 2248  Gross per 24 hour  Intake      0 ml  Output    100 ml  Net   -100 ml      Physical Exam:  General: frail, thin, elderly white female, laying in bed NAD HEENT: + temporal muscle wasting, mm,no exudate noted  Chest: CTA  CVS: RRR  Abdomen: soft NT +BS  Ext: BLE trace edema, Neuro: alert and oriented X3     Labs: CBC    Component Value Date/Time   WBC 13.7* 11/24/2013 0245   WBC 10.1* 09/10/2007 1033   RBC 3.45* 11/24/2013 0245   RBC 3.75* 05/30/2009 0327   RBC 3.74 09/10/2007 1033   HGB 10.0* 11/24/2013 0245   HGB 11.6 09/10/2007 1033   HCT 30.3* 11/24/2013 0245   HCT 32.8* 09/10/2007 1033   PLT 188 11/24/2013 0245   PLT 236 09/10/2007 1033   MCV 87.8 11/24/2013 0245   MCV 87.7 09/10/2007 1033   MCH 29.0 11/24/2013 0245   MCH 30.9 09/10/2007 1033   MCHC 33.0 11/24/2013 0245   MCHC 35.3 09/10/2007 1033   RDW 16.9* 11/24/2013 0245   RDW 12.7 09/10/2007 1033   LYMPHSABS 1.0 11/21/2013 2250   LYMPHSABS 2.0 09/10/2007 1033   MONOABS 0.8  11/21/2013 2250   MONOABS 0.6 09/10/2007 1033   EOSABS 0.0 11/21/2013 2250   EOSABS 0.4 09/10/2007 1033   BASOSABS 0.0 11/21/2013 2250   BASOSABS 0.1 09/10/2007 1033    BMET    Component Value Date/Time   NA 143 11/26/2013 0715   K 4.2 11/26/2013 0715   CL 107 11/26/2013 0715   CO2 21 11/26/2013 0715   GLUCOSE 131* 11/26/2013 0715   BUN 37* 11/26/2013 0715   CREATININE 0.96 11/26/2013 0715   CALCIUM 8.2* 11/26/2013 0715   GFRNONAA 52* 11/26/2013 0715   GFRAA 60* 11/26/2013 0715    CMP     Component Value Date/Time   NA 143 11/26/2013 0715   K 4.2 11/26/2013 0715   CL 107 11/26/2013 0715   CO2 21 11/26/2013 0715   GLUCOSE 131* 11/26/2013 0715   BUN 37* 11/26/2013 0715   CREATININE 0.96 11/26/2013 0715   CALCIUM 8.2* 11/26/2013 0715   PROT 4.7* 11/23/2013 0845   ALBUMIN 2.1* 11/23/2013 0845   AST 38* 11/23/2013 0845   ALT 45* 11/23/2013 0845   ALKPHOS 60 11/23/2013 0845   BILITOT 0.2* 11/23/2013 0845   GFRNONAA 52* 11/26/2013 0715   GFRAA 60* 11/26/2013 0715     Assessment and Plan:  1. Code Status: DNR/DNI 2. Symptom Control: 1. Anxiety/Agitation: Ativan 0.5 mg po every 8 hrs prn 2. Pain: Oxycodone 5 mg po every 4 hrs prn 3. Weakness: PT/OT consulted, SNF for rehab on dc  3. Psycho/Social:  Emotional support offered to patient at bedside.  Attempted to call daughter Celene Skeen unable to leave message/ 4. Spiritual  Strong community church support 5. Disposition:  To SNF for rehabilitation  Patient Documents Completed or Given: Document Given Completed  Advanced Directives Pkt    MOST yes   DNR    Gone from [facility]    Hard Choices yes      Wadie Lessen NP  Palliative Medicine Team Team Phone # [phone] Pager [phone]   1

## 2013-11-27 DIAGNOSIS — R634 Abnormal weight loss: Secondary | ICD-10-CM

## 2013-11-27 DIAGNOSIS — R110 Nausea: Secondary | ICD-10-CM

## 2013-11-27 LAB — URINALYSIS, ROUTINE W REFLEX MICROSCOPIC
GLUCOSE, UA: NEGATIVE mg/dL
Hgb urine dipstick: NEGATIVE
Ketones, ur: 15 mg/dL — AB
Nitrite: NEGATIVE
PH: 5 (ref 5.0–8.0)
Protein, ur: 100 mg/dL — AB
Specific Gravity, Urine: 1.022 (ref 1.005–1.030)
Urobilinogen, UA: 0.2 mg/dL (ref 0.0–1.0)

## 2013-11-27 LAB — URINE MICROSCOPIC-ADD ON

## 2013-11-27 LAB — GLUCOSE, CAPILLARY
Glucose-Capillary: 96 mg/dL (ref 70–99)
Glucose-Capillary: 135 mg/dL — ABNORMAL HIGH (ref 70–99)
Glucose-Capillary: 226 mg/dL — ABNORMAL HIGH (ref 70–99)

## 2013-11-27 MED ORDER — TRAZODONE 25 MG HALF TABLET: 25.0000 mg | ORAL_TABLET | Freq: Every evening | ORAL | Status: AC | PRN

## 2013-11-27 MED ORDER — ALBUTEROL SULFATE (2.5 MG/3ML) 0.083% IN NEBU: 2.5000 mg | INHALATION_SOLUTION | RESPIRATORY_TRACT | Status: AC | PRN

## 2013-11-27 MED ORDER — LORAZEPAM 0.5 MG PO TABS: 0.5000 mg | ORAL_TABLET | Freq: Three times a day (TID) | ORAL | Status: AC | PRN

## 2013-11-27 MED ORDER — OXYCODONE HCL 5 MG PO TABS: 5.0000 mg | ORAL_TABLET | ORAL | Status: AC | PRN

## 2013-11-27 NOTE — Discharge Summary (Signed)
Physician Discharge Summary  [name] FBP:[id number] DOB: 10/28/1925 DOA: 11/21/2013  PCP: Joycelyn Man, MD  Admit date: 11/21/2013 Discharge date: 11/27/2013  Time spent: >35 minutes  Recommendations for Outpatient Follow-up:  F/u with palliative care F/uw ith PCP as needed Discharge Diagnoses:  Principal Problem:   Respiratory failure Active Problems:   HYPERLIPIDEMIA   HYPERTENSION   Tobacco abuse   CAD (coronary artery disease)   Hyperkalemia   Elevated troponin   Hypoxia   Protein-calorie malnutrition, severe   Palliative care encounter   Weakness generalized   Discharge Condition: stable  Diet recommendation: heart healthy   Filed Weights   11/25/13 0432 11/25/13 2053 11/26/13 2149  Weight: 35.7 kg (78 lb 11.3 oz) 36.333 kg (80 lb 1.6 oz) 37.2 kg (82 lb 0.2 oz)    History of present illness:  80 y.o. female who was brought to the ED due to increased weakness and fatigue x 1 week continuing to decline since her pacemaker placement 1 week previous to this admit. She had several falls and decreased PO intake. In the ED she was found to be hypoxic and was placed on BIPAP. A chest X-ray was negative for acute findings.  When she was hospitalized previously, hospice services where in the process of being arranged by her PCP. Her family is currently interested in patient having palliative care services arranged.   Hospital Course:  1. 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 Acute renal failure  crt was 1.0  at time of recent d/c - appears to be slowly improving w/ volume expansion - follow for now as a prognostic indicator - pt noted to have been on 2 diuretics prior to admit per med rec  -hold diuretics, no s/s of acute CHF 3. Hyperkalemia  Resolved  4. Elevated Troponin / NSTEMI w/ known hx of CAD  Trop 2.65 at time of admit - EKG showed paced rythmn - had cardiac cath earlier this month > 50-60% stenosis in the mid LAD and circumflex between OM1 and OM 2 with more significant lesion in a small ramus-type marginal branch of the circumflex - widely patent RCA stent but no significant lesions in the RCA - troponin quickly improving - follow clinically w/ no further intervention planned  5. CHB s/p Medtronic Adapta L Model ADDR: 1 Pacer 1/16  6. DM  SSI only for now - follow CBG trend  7. HLD  tx not appropriate at present due to severe malnutrition and poor intake  8. HTN  Not an active problem at this time  9. Hx of ovarian CA  10. Underweight - Body mass index is 15.37 kg/(m^2).  11. Confusion, try to limit benzo, opioids if possible; fall precaution; afebrile no s/s of infection at this time    Prognosis if poor may need continuous palliative care;  Updated her daughter at t he bedsdie     Procedures:  CT (i.e. Studies not automatically included, echos, thoracentesis, etc; not x-rays)  Consultations:  None   Discharge Exam:  Filed Vitals:   11/27/13 1218  BP: 99/57  Pulse: 62  Temp: 98 F (36.7 C)  Resp: 17    General: alert, but intermittent confusion  Cardiovascular: s1,s2 rrr Respiratory: poor ventilation LL  Discharge Instructions  Discharge Orders   Future Orders Complete By Expires   Diet - low sodium heart healthy  As directed    Discharge instructions  As directed    Comments:     Please follow up with primary care doctor in 1-2 weeks   Increase activity slowly  As directed        Medication List    STOP taking these medications       furosemide 20  MG tablet  Commonly known as:  LASIX     hydrochlorothiazide 12.5 MG tablet  Commonly known as:  HYDRODIURIL     ramipril 10 MG capsule  Commonly known as:  ALTACE     zolpidem 5 MG tablet  Commonly known as:  AMBIEN      TAKE these medications       albuterol (2.5 MG/3ML) 0.083% nebulizer solution  Commonly known as:  PROVENTIL  Take 3 mLs (2.5 mg total) by nebulization every 4 (four) hours as needed for wheezing or shortness of breath.     aspirin 81 MG tablet  Take 81 mg by mouth daily.     Calcium-Vitamin D 600-200 MG-UNIT Caps  Take 1 tablet by mouth every evening.     fish oil-omega-3 fatty acids 1000 MG capsule  Take 1 g by mouth daily. 1 tab po qd     LORazepam 0.5 MG tablet  Commonly known as:  ATIVAN  Take 1 tablet (0.5 mg total) by mouth every 8 (eight) hours as needed for anxiety.     meclizine 50 MG tablet  Commonly known as:  ANTIVERT  Take 0.5 tablets (25 mg total) by mouth 3 (three) times daily as needed.     megestrol 40 MG tablet  Commonly known as:  MEGACE  Take 1 tablet (40 mg total) by mouth daily.     metoprolol tartrate 12.5 mg Tabs tablet  Commonly known as:  LOPRESSOR  Take 0.5 tablets (12.5 mg total) by mouth 2 (two) times daily.     mirtazapine 7.5 MG tablet  Commonly known as:  REMERON  Take 7.5 mg by mouth at bedtime.     multivitamin tablet  Take 1 tablet by mouth daily.     nicotine 21 mg/24hr patch  Commonly known as:  NICODERM CQ - dosed in mg/24 hours  Place 1 patch (21 mg total) onto the skin daily.     NIFEdipine 90 MG 24 hr tablet  Commonly known as:  PROCARDIA XL/ADALAT-CC  Take 90 mg by mouth daily.     nitroGLYCERIN 0.4 MG SL tablet  Commonly known as:  NITROSTAT  Place 0.4 mg under the tongue every 5 (five) minutes as needed for chest pain.     oxyCODONE 5 MG immediate release tablet  Commonly known as:  Oxy IR/ROXICODONE  Take 1 tablet (5 mg total) by mouth every 4 (four) hours as needed for moderate pain.      traZODone 25 mg Tabs tablet  Commonly known as:  DESYREL  Take 0.5 tablets (25 mg total) by mouth at bedtime as needed for sleep.       No Known Allergies     Follow-up Information   Follow up with TODD,JEFFREY ALLEN, MD In 1 week.   Specialty:  Family Medicine   Contact information:   [place] [address] [phone]        The results of significant diagnostics from this hospitalization (including imaging, microbiology, ancillary and laboratory) are listed below for reference.    Significant Diagnostic Studies: Dg Chest 1 View  11/15/2013   CLINICAL DATA:  Postop from pacemaker placement.  EXAM: CHEST - 1 VIEW  COMPARISON:  06/16/2012  FINDINGS: New dual lead transvenous pacemaker is seen in appropriate position with leads overlying the right atrium and right ventricle. No pneumothorax identified.  Pulmonary hyperinflation again seen, consistent with COPD. Both lungs are clear. No evidence of pleural effusion. Mild cardiomegaly is stable. No evidence of congestive heart failure.  IMPRESSION: New pacemaker in appropriate position. No evidence of pneumothorax or other acute findings.  Stable COPD and mild cardiomegaly.   Electronically Signed   By: Earle Gell M.D.   On: 11/15/2013 12:20   Dg Chest Portable 1 View  11/21/2013   CLINICAL DATA:  Short-of-breath  EXAM: PORTABLE CHEST - 1 VIEW  COMPARISON:  11/15/2013  FINDINGS: Left chest wall pacer device is noted with lead in the right atrial appendage and right ventricle. Calcified atherosclerotic disease involves the thoracic aorta. The heart size and mediastinal contours are within normal limits. Both lungs are clear. The visualized skeletal structures are unremarkable.  IMPRESSION: No active disease.   Electronically Signed   By: Kerby Moors M.D.   On: 11/21/2013 22:43    Microbiology: Recent Results (from the past 240 hour(s))  MRSA PCR SCREENING     Status: None   Collection Time    11/22/13  2:24  AM      Result Value Range Status   MRSA by PCR NEGATIVE  NEGATIVE Final   Comment:            The GeneXpert MRSA Assay (FDA     approved for NASAL specimens     only), is one component of a     comprehensive MRSA colonization     surveillance program. It is not     intended to diagnose MRSA     infection nor to guide or     monitor treatment for     MRSA infections.     Labs: Basic Metabolic Panel:  Recent Labs Lab 11/21/13 2250 11/22/13 0815 11/23/13 0845 11/24/13 0245 11/26/13 0715  NA 142 144 143 140 143  K 5.7* 5.5* 3.5* 3.4* 4.2  CL 102 105 105 105 107  CO2 18* 21 24 23 21   GLUCOSE 166* 159* 114* 159* 131*  BUN 55* 59* 51* 50* 37*  CREATININE 1.48* 1.57* 1.42* 1.30* 0.96  CALCIUM 8.5 8.8 7.7* 7.4* 8.2*   Liver Function Tests:  Recent Labs Lab 11/21/13 2250 11/23/13 0845  AST 130* 38*  ALT 91* 45*  ALKPHOS 95 60  BILITOT 0.4 0.2*  PROT 6.1 4.7*  ALBUMIN 2.7* 2.1*   No results found for this basename: LIPASE, AMYLASE,  in the last 168 hours No results found for this basename: AMMONIA,  in the last 168 hours CBC:  Recent Labs Lab 11/21/13 2250 11/23/13 0845 11/24/13 0245  WBC 10.7* 10.1 13.7*  NEUTROABS 8.8*  --   --   HGB 14.8 10.1* 10.0*  HCT 44.0 30.6* 30.3*  MCV 88.0 87.9 87.8  PLT 185 160 188   Cardiac Enzymes:  Recent Labs Lab 11/22/13 0815 11/23/13 0845  TROPONINI 2.00* 1.11*   BNP: BNP (last 3 results)  Recent Labs  06/26/13 1838 11/21/13 2250  PROBNP 944.8* >70000.0*   CBG:  Recent Labs Lab 11/26/13 1159 11/26/13 1406 11/26/13 1716 11/26/13 2111 11/27/13 0819  GLUCAP 189* 163* 85 131* 96       Signed:  Nelissa Bolduc N  [facility] 11/27/2013, 12:37 PM

## 2013-11-27 NOTE — Progress Notes (Signed)
[facility] PROGRESS NOTE  [name] INO:[id number] DOB: 06/29/1925 DOA: 11/21/2013 PCP: Joycelyn Man, MD   Brief Narrative:  80 y.o. female who was brought to the ED due to increased weakness and fatigue x 1 week continuing to decline since her pacemaker placement 1 week previous to this admit. She had several falls and decreased PO intake. In the ED she was found to be hypoxic and was placed on BIPAP. A chest X-ray was negative for acute findings.  When she was hospitalized previously, hospice services where in the process of being arranged by her PCP. Her family is currently interested in patient having palliative care services arranged.   Assessment/Plan:  1. 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 Acute renal failure  crt was 1.0 at time of recent d/c - appears to be slowly improving w/ volume expansion - follow for now as a prognostic indicator - pt noted to have been on 2 diuretics prior to admit per med rec  3. Hyperkalemia  Resolved  4. Elevated Troponin / NSTEMI w/ known hx of CAD  Trop 2.65 at time of admit - EKG showed paced rythmn - had cardiac cath earlier this month > 50-60% stenosis in the mid LAD and circumflex between OM1 and OM 2 with more significant lesion in a small ramus-type marginal branch of the circumflex - widely patent RCA stent but no significant lesions in the RCA - troponin quickly improving - follow clinically w/ no further intervention planned  5. CHB s/p Medtronic Adapta L Model ADDR: 1 Pacer 1/16  6. DM  SSI only for  now - follow CBG trend  7. HLD  tx not appropriate at present due to severe malnutrition and poor intake  8. HTN  Not an active problem at this time  9. Hx of ovarian CA  10. Underweight - Body mass index is 15.37 kg/(m^2). 11. Confusion, obtain UA r/o UTI; try to limit benzo, opioids if possible; fall precaution;  -episode of fall without trauma; likely due to decondition; plan as above    Code Status: DNR Family Communication: d/w patient; again called [place] Daughter [phone] No answer; try later  (indicate person spoken with, relationship, and if by phone, the number) Disposition Plan: SNF  Palliative Care  Procedures:  none  Antibiotics:  Zosyn 1/23  vanc 1/23  HPI/Subjective: alert  Objective: Filed Vitals:   11/27/13 0822  BP: 124/48  Pulse: 64  Temp: 97.6 F (36.4 C)  Resp:     Intake/Output Summary (Last 24 hours) at 11/27/13 1001 Last data filed at 11/26/13 1700  Gross per 24 hour  Intake      0 ml  Output    350 ml  Net   -350 ml   Filed Weights   11/25/13 0432 11/25/13 2053 11/26/13 2149  Weight: 35.7 kg (78 lb 11.3 oz) 36.333 kg (80 lb 1.6 oz) 37.2 kg (82 lb 0.2 oz)    Exam:   General:  alert  Cardiovascular: s1,s2 rrr  Respiratory: few wheezing  Abdomen: soft, nt, nd   Musculoskeletal: no le edema   Data Reviewed: Basic Metabolic Panel:  Recent Labs Lab 11/21/13 2250 11/22/13 0815 11/23/13 0845 11/24/13 0245 11/26/13 0715  NA 142 144 143 140 143  K 5.7* 5.5* 3.5* 3.4* 4.2  CL 102 105 105 105 107  CO2 18* 21 24 23 21   GLUCOSE 166* 159* 114* 159* 131*  BUN 55* 59* 51* 50* 37*  CREATININE 1.48* 1.57* 1.42* 1.30* 0.96  CALCIUM 8.5 8.8 7.7* 7.4* 8.2*   Liver Function Tests:  Recent Labs Lab 11/21/13 2250 11/23/13 0845  AST 130* 38*  ALT 91* 45*  ALKPHOS 95 60  BILITOT 0.4 0.2*  PROT 6.1 4.7*  ALBUMIN 2.7* 2.1*   No results found for this basename: LIPASE, AMYLASE,  in the last 168  hours No results found for this basename: AMMONIA,  in the last 168 hours CBC:  Recent Labs Lab 11/21/13 2250 11/23/13 0845 11/24/13 0245  WBC 10.7* 10.1 13.7*  NEUTROABS 8.8*  --   --   HGB 14.8 10.1* 10.0*  HCT 44.0 30.6* 30.3*  MCV 88.0 87.9 87.8  PLT 185 160 188   Cardiac Enzymes:  Recent Labs Lab 11/22/13 0815 11/23/13 0845  TROPONINI 2.00* 1.11*   BNP (last 3 results)  Recent Labs  06/26/13 1838 11/21/13 2250  PROBNP 944.8* >70000.0*   CBG:  Recent Labs Lab 11/26/13 0903 11/26/13 1159 11/26/13 1406 11/26/13 1716 11/26/13 2111  GLUCAP 152* 189* 163* 85 131*    Recent Results (from the past 240 hour(s))  MRSA PCR SCREENING     Status: None   Collection Time    11/22/13  2:24 AM      Result Value Range Status   MRSA by PCR NEGATIVE  NEGATIVE Final   Comment:            The GeneXpert MRSA Assay (FDA     approved for NASAL specimens     only), is one component of a     comprehensive MRSA colonization     surveillance program. It is not     intended to diagnose MRSA     infection nor to guide or     monitor treatment for     MRSA infections.     Studies: No results found.  Scheduled Meds: . insulin aspart  0-9 Units Subcutaneous TID WC  . metoprolol tartrate  12.5 mg Oral BID  . mirtazapine  7.5 mg Oral QHS  . nicotine  14 mg Transdermal Daily   Continuous Infusions:   Principal Problem:   Respiratory failure Active Problems:   HYPERLIPIDEMIA   HYPERTENSION   Tobacco abuse   CAD (coronary artery disease)   Hyperkalemia   Elevated troponin   Hypoxia   Protein-calorie malnutrition, severe   Palliative care encounter   Weakness generalized    Time spent: >35 minutes     Kinnie Feil  [facility] Pager [phone]. If 7PM-7AM, please contact night-coverage at [email or web address], password [facility] 11/27/2013, 10:01 AM  LOS: 6 days

## 2013-11-27 NOTE — Progress Notes (Signed)
Patient discharged to [facility]; report called to [place], Therapist, sports. Patient AVS reviewed with nurse over the phone. Patient remains stable; no signs or symptoms of distress.  Patient transported via EMS, belongings transported by her daughter.

## 2013-11-27 NOTE — Telephone Encounter (Signed)
Left message on machine for daughter returning her call

## 2013-11-28 LAB — URINE CULTURE
COLONY COUNT: NO GROWTH
CULTURE: NO GROWTH

## 2013-11-28 NOTE — Clinical Social Work Psychosocial (Signed)
Clinical Social Work Department BRIEF (LATE ENTRY) PSYCHOSOCIAL ASSESSMENT AND DISCHARGE NOTE 11/28/2013  Patient:  [name], [name]     Account Number:  [id number]     Admit date:  11/21/2013  Clinical Social Worker:  Frederico Hamman  Date/Time:  11/28/2013 08:10 AM  Referred by:  Physician  Date Referred:  11/26/2013 Referred for  SNF Placement   Other Referral:   Interview type:  Patient Other interview type:   CSW talked with patient and son, [name] at the bedside.    PSYCHOSOCIAL DATA Living Status:  ALONE Admitted from facility:   Level of care:   Primary support name:  [name] Primary support relationship to patient:  CHILD, ADULT Degree of support available:   Son [name] lives in [place] and came to see patient due to hospitalization.    CURRENT CONCERNS Current Concerns  Post-Acute Placement   Other Concerns:   Palliative consult done with family on 1/27 to discuss goals of care.    SOCIAL WORK ASSESSMENT / PLAN On 1/28 CSW talked with patient and son regarding discharge planning and recommendation of short-term rehab. CSW initially talked with patient as son was not in the room and she was alert and engaged with CSW. Patient was agreeable to SNF for ST rehab. While talking with patient, son came into room and CSW talked with him about same and he was also in agreement. SNF list for [facility] provided to patient and son and SNF search process explained and questions answered.   Assessment/plan status:  No Further Intervention Required Other assessment/ plan:   Information/referral to community resources:   on 11/26/13 SNF list for [facility] provided.    PATIENT'S/FAMILY'S RESPONSE TO PLAN OF CARE: Patient was alert and able to engage with CSW on 1/28. Son was reserved but attentive to CSW regarding discharge planning.    11/27/13 - Patient discharged to [facility] via ambulance. Family was at the bedside.

## 2013-11-28 NOTE — Consult Note (Signed)
I have reviewed this case with our NP and agree with the Assessment and Plan as stated.  Lorraine Terriquez L. Jannetta Massey, MD MBA The Palliative Medicine Team at [place] Team Phone: [phone] Pager: [phone]   

## 2013-11-28 NOTE — Clinical Social Work Placement (Signed)
Clinical Social Work Department CLINICAL SOCIAL WORK PLACEMENT NOTE 11/28/2013  Patient:  [name], [name]  Account Number:  [id number] Admit date:  11/21/2013  Clinical Social Worker:  Sandon Yoho Givens, LCSW  Date/time:  11/28/2013 08:29 AM  Clinical Social Work is seeking post-discharge placement for this patient at the following level of care:   [place]   (*CSW will update this form in Epic as items are completed)   11/26/2013  Patient/family provided with [place] Department of Clinical Social Work's list of facilities offering this level of care within the geographic area requested by the patient (or if unable, by the patient's family).  11/26/2013  Patient/family informed of their freedom to choose among providers that offer the needed level of care, that participate in Medicare, Medicaid or managed care program needed by the patient, have an available bed and are willing to accept the patient.    Patient/family informed of [facility]' ownership interest in [facility], as well as of the fact that they are under no obligation to receive care at this facility.  PASARR submitted to EDS on 11/26/2013 PASARR number received from EDS on 11/27/2013  FL2 transmitted to all facilities in geographic area requested by pt/family on  11/26/2013 FL2 transmitted to all facilities within larger geographic area on   Patient informed that his/her managed care company has contracts with or will negotiate with  certain facilities, including the following:     Patient/family informed of bed offers received:  11/27/2013 Patient chooses bed at [facility] Physician recommends and patient chooses bed at    Patient to be transferred to [facility] on  11/27/2013 Patient to be transferred to facility by   The following physician request were entered in Epic:   Additional Comments:

## 2013-11-28 NOTE — Telephone Encounter (Signed)
OK 

## 2013-11-28 NOTE — Telephone Encounter (Signed)
Patient's daughter called and the patient is now at [facility].  Any advise?

## 2013-12-01 NOTE — Telephone Encounter (Signed)
[name] okay to talk to the daughter. I recommended [name] be in an assisted living facility

## 2013-12-03 ENCOUNTER — Telehealth: Payer: Self-pay | Admitting: Family Medicine

## 2013-12-03 NOTE — Telephone Encounter (Signed)
Pt daughter would like to have hospice come in due to her mother dx failure to thrive. Pt is at [facility] now

## 2013-12-03 NOTE — Telephone Encounter (Signed)
Pt has been in the hospital and is now at a rehab.

## 2013-12-04 NOTE — Telephone Encounter (Signed)
Call placed to hospice

## 2013-12-05 ENCOUNTER — Telehealth: Payer: Self-pay | Admitting: Family Medicine

## 2013-12-05 NOTE — Telephone Encounter (Signed)
I left a voice message stating that Dr. Sherren Mocha was out of the office today and I would forward this note to him, he will be back in the office on Monday 12/08/13.

## 2013-12-05 NOTE — Telephone Encounter (Signed)
Caller: Gale/Child; Phone: [phone]; Reason for Call: Gale/daughter calling about patient who was admitted to hospital about 2 weeks ago, then released and moved to [facility] [facility] facility.   Mother/patient  is now 43 Lbs and asking for help to get assistance initiating Hospice care for her mother right away.   Please review, contact caller at home [phone] or patient's son/David at [phone].

## 2013-12-05 NOTE — Telephone Encounter (Signed)
Daughter is requesting pt to have hospice for pt. pls advise.

## 2013-12-28 NOTE — Telephone Encounter (Signed)
Called and initiated visit with Hospice.  Hospice to get in touch with [facility]. 

## 2013-12-28 NOTE — Telephone Encounter (Signed)
Called and initiated visit with Hospice.  Hospice to get in touch with [facility].

## 2013-12-28 DEATH — deceased

## 2014-01-23 ENCOUNTER — Telehealth: Payer: Self-pay

## 2014-01-23 NOTE — Telephone Encounter (Signed)
Patient past away @ [facility] per [facility] in [place]

## 2014-04-21 ENCOUNTER — Telehealth: Payer: Self-pay | Admitting: Family Medicine

## 2014-04-21 NOTE — Telephone Encounter (Signed)
Ivin Booty called from [facility] and is needing A1C results 90 days before February or after February    phone number [phone] ext [phone]

## 2014-05-07 IMAGING — CT CT HEAD W/O CM
2 series · 16 of 30 positions shown, 20 images · non-contrast
Comparison: November 14, 2010.

CLINICAL DATA: Headache, hypertension.

CT HEAD WITHOUT CONTRAST
TECHNIQUE: Contiguous axial images were obtained from the base of
the skull through the vertex without contrast.

[Series 2: head w/o · axial · non-contrast · 0.43mm/px · z∈[-114,+6]mm · 13 of 29 slices shown, 17 images]
[im 3/29  brain]
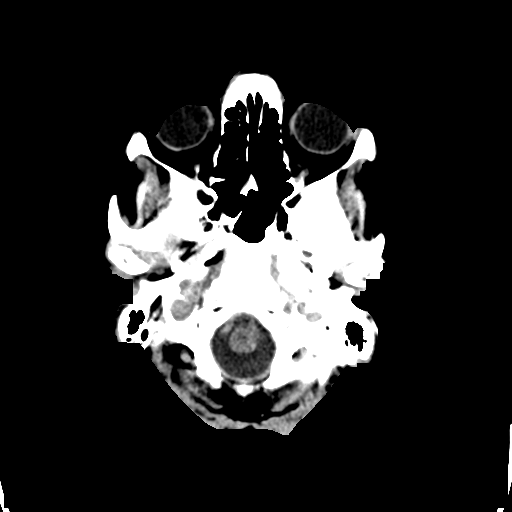
[im 3/29  bone]
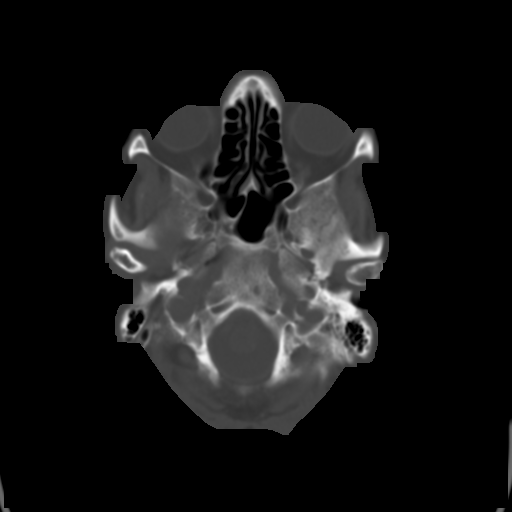
[im 5/29  brain]
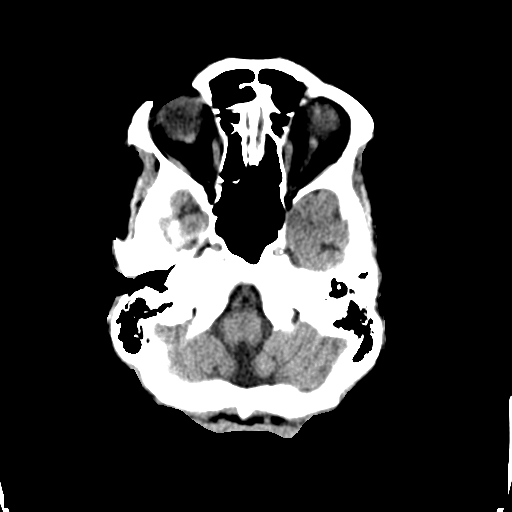
[im 7/29  brain]
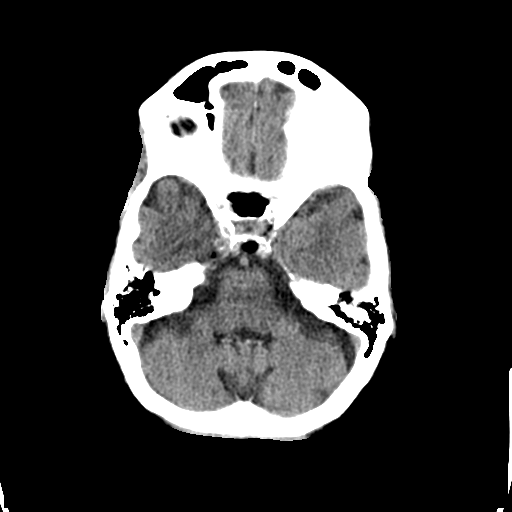
[im 9/29  brain]
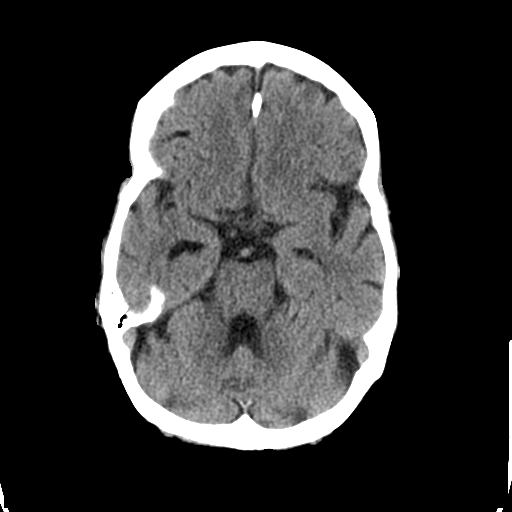
[im 11/29  brain]
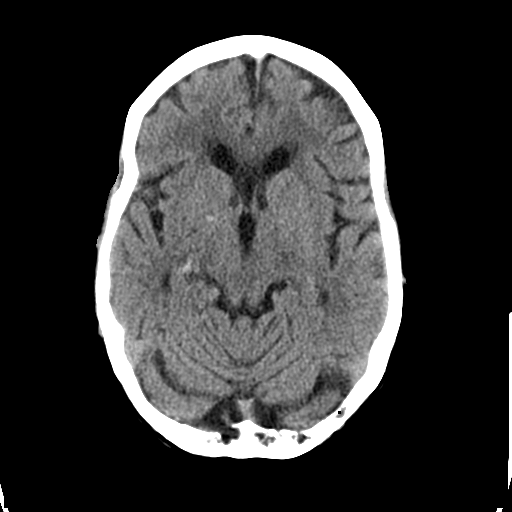
[im 11/29  bone]
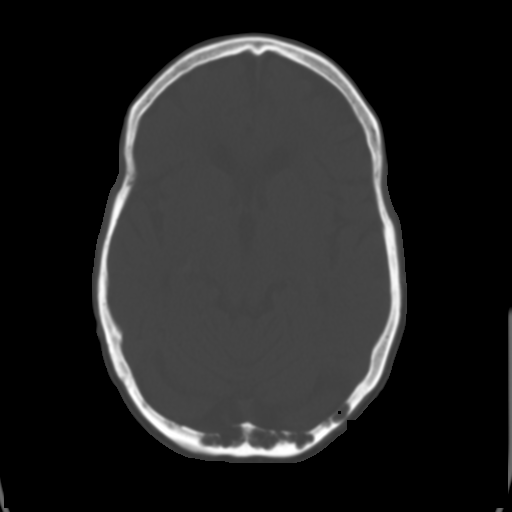
[im 13/29  brain]
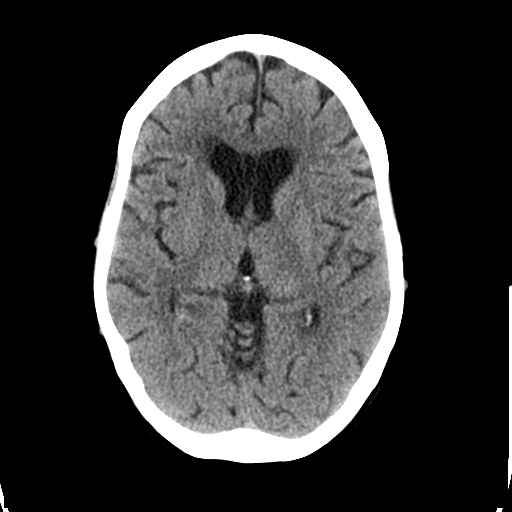
[im 15/29  brain]
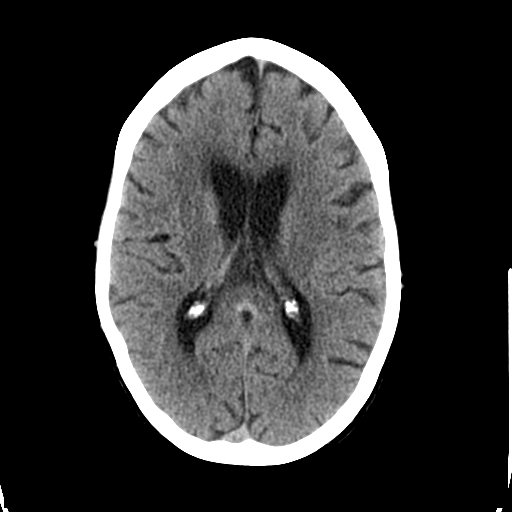
[im 17/29  brain]
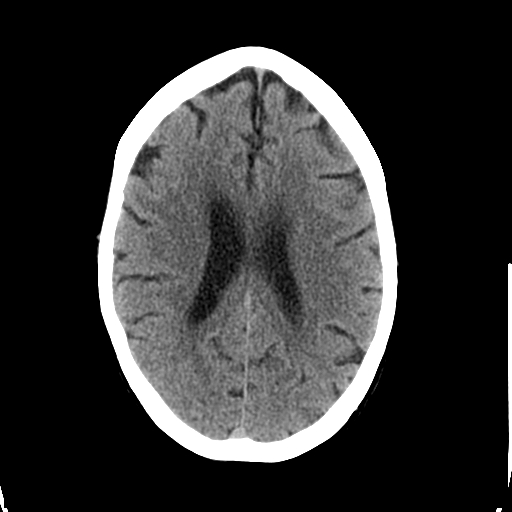
[im 19/29  brain]
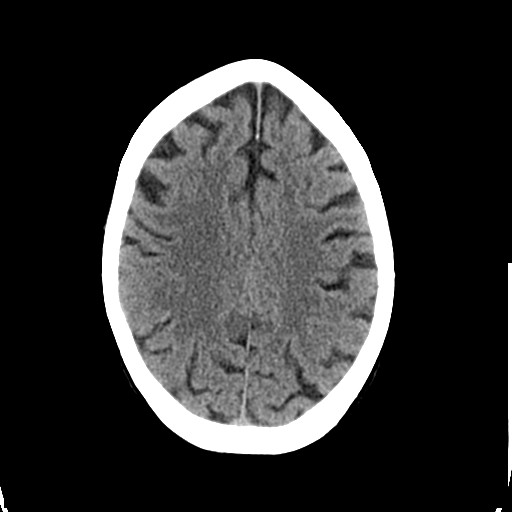
[im 19/29  bone]
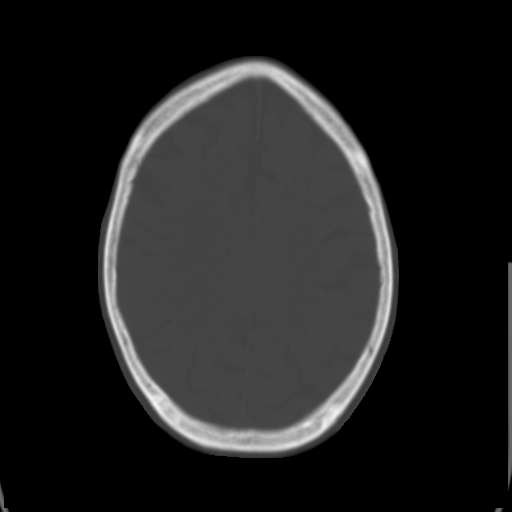
[im 21/29  brain]
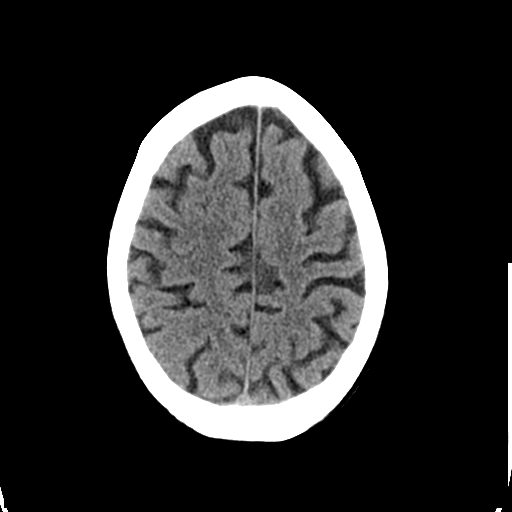
[im 23/29  brain]
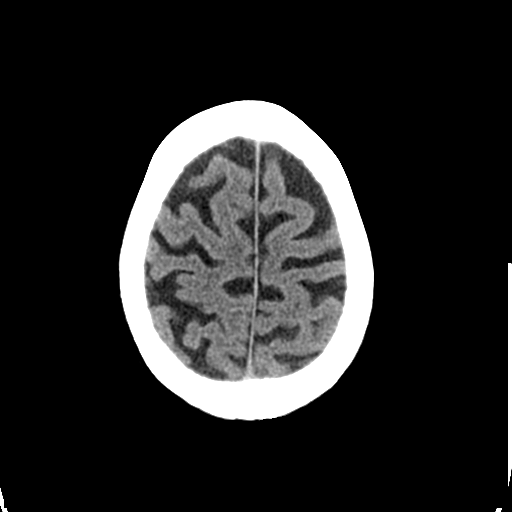
[im 25/29  brain]
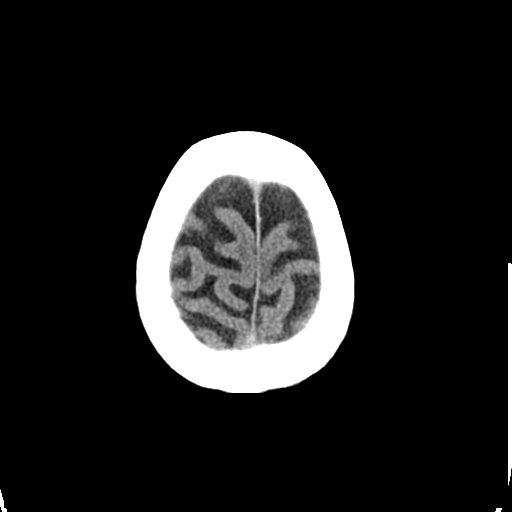
[im 27/29  brain]
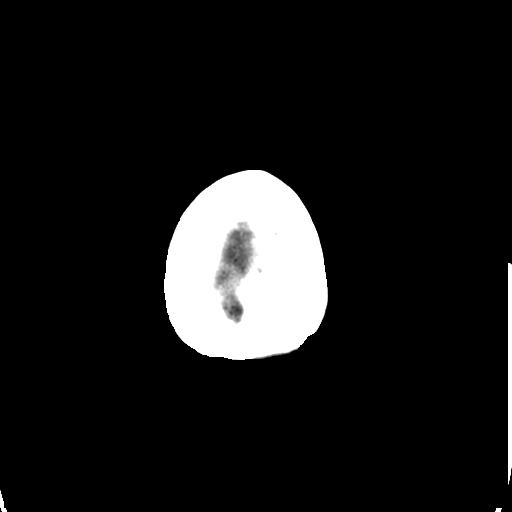
[im 27/29  bone]
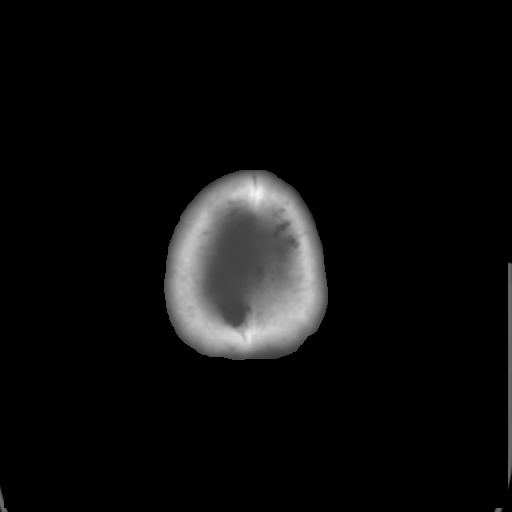

[Series 3: bone windows · axial · 0.43mm/px · z∈[-114,-74]mm · 3 of 29 slices shown]
[im 3/29  bone]
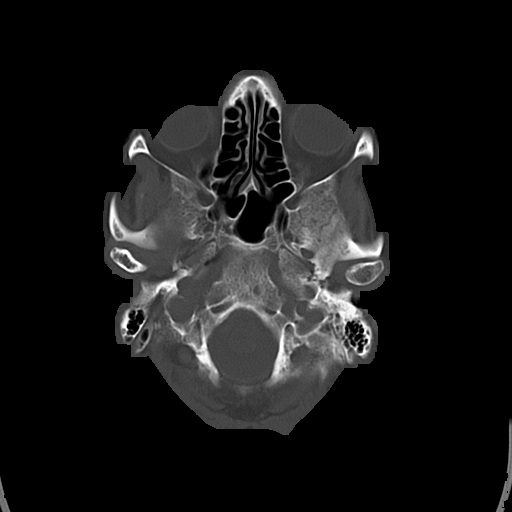
[im 7/29  bone]
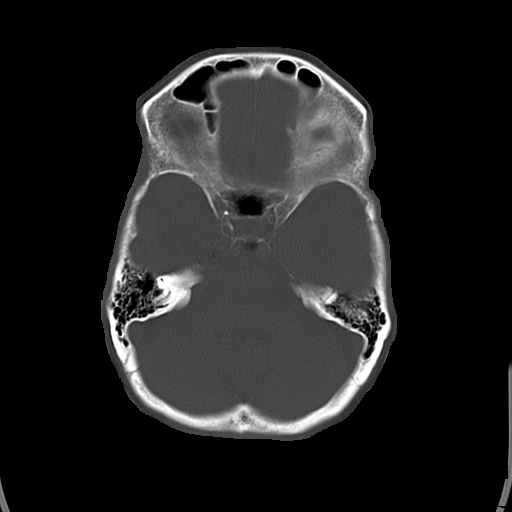
[im 11/29  bone]
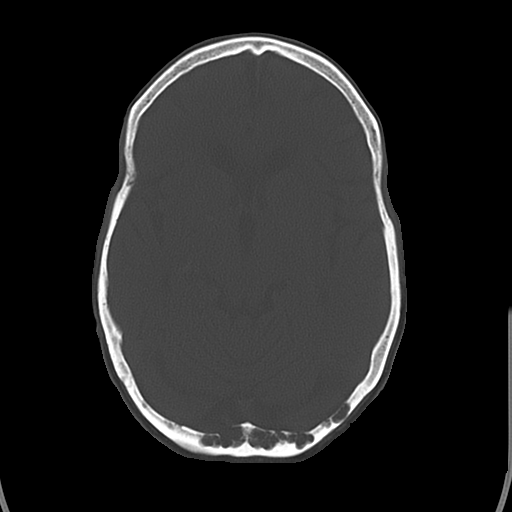

[16 of 30 positions shown; findings below may reference images not displayed]

FINDINGS: Bony calvarium is intact except for prominent venous
lakes seen posteriorly which are unchanged compared to prior exam.
Mild diffuse cortical atrophy is noted.  No mass effect or midline
shift is noted.  Ventricular size is within normal limits.  There
is no evidence of mass, hemorrhage or acute infarction.
IMPRESSION: No acute intracranial abnormality seen.

## 2014-10-08 ENCOUNTER — Encounter (HOSPITAL_COMMUNITY): Payer: Self-pay | Admitting: Cardiology

## 2015-10-12 IMAGING — CR DG CHEST 1V PORT
1 series · 1 of 1 positions shown · non-contrast
Comparison: 11/15/2013

CLINICAL DATA: Short-of-breath

EXAM:
PORTABLE CHEST - 1 VIEW

[AP]
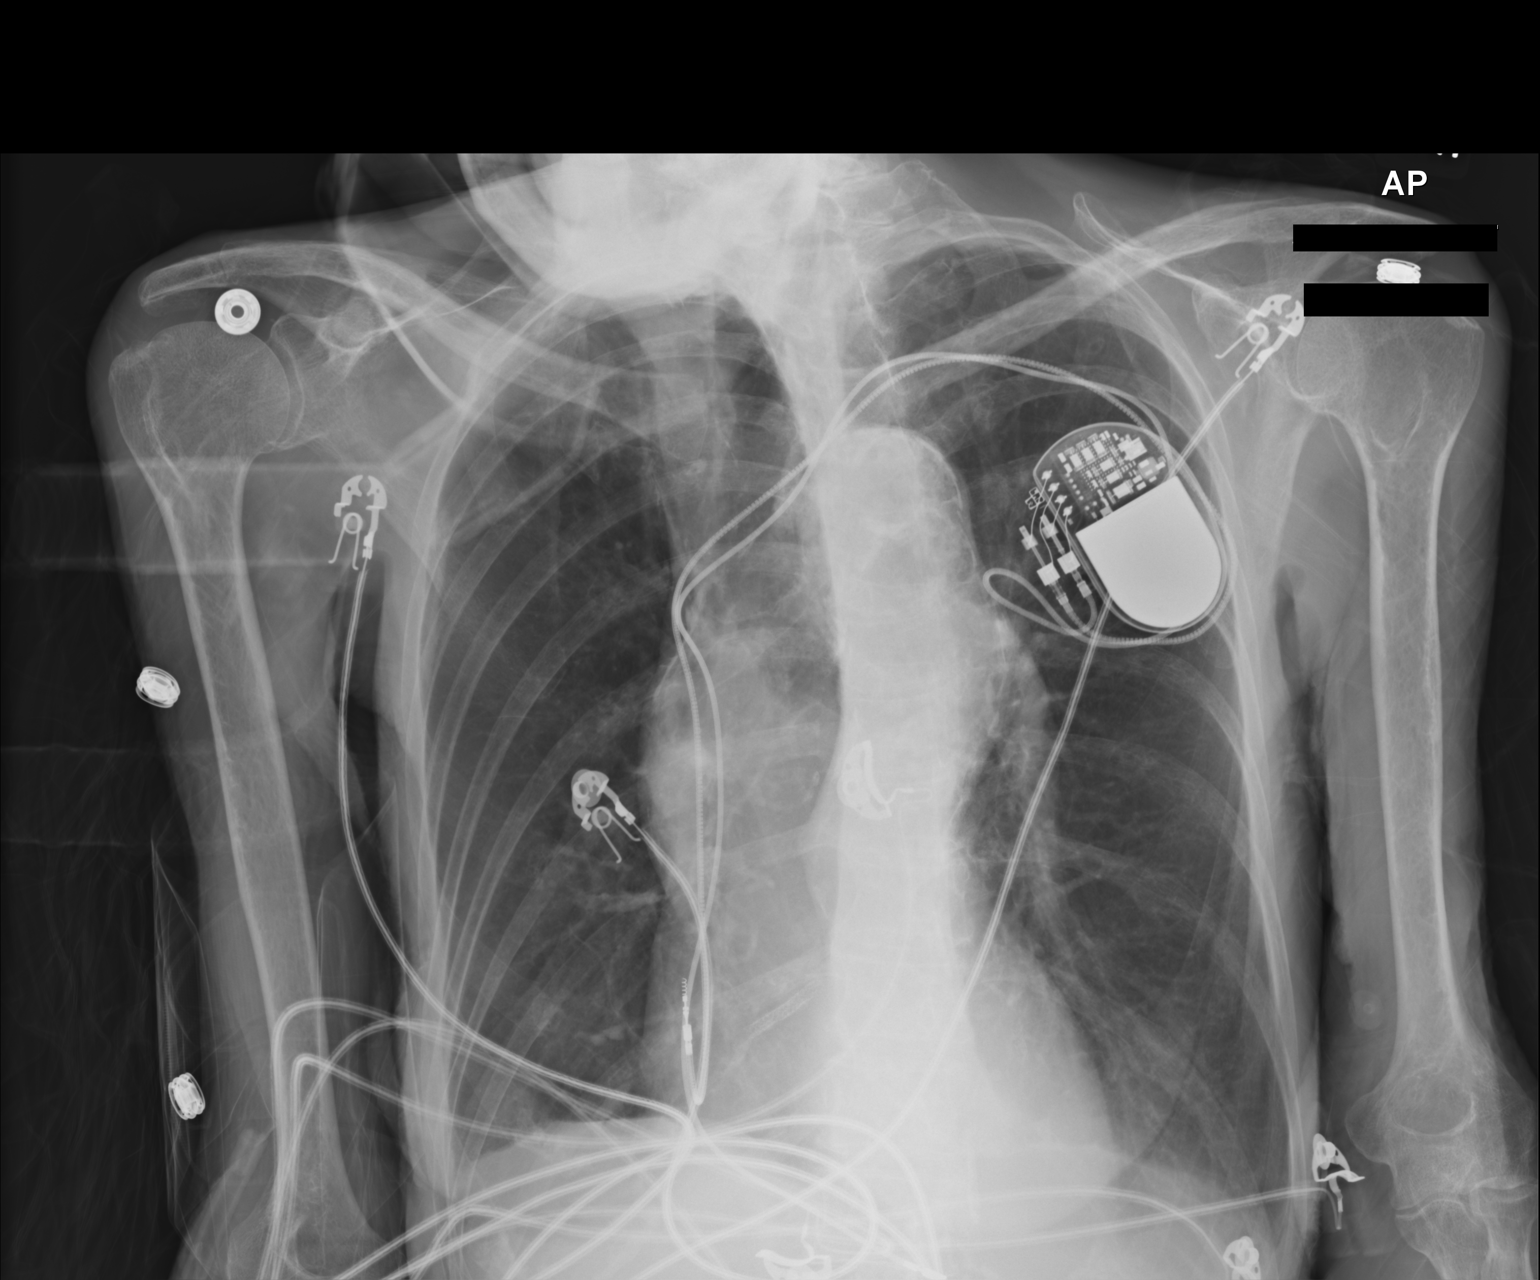

[1 of 1 positions shown; findings below may reference images not displayed]

FINDINGS: Left chest wall pacer device is noted with lead in the right atrial
appendage and right ventricle. Calcified atherosclerotic disease
involves the thoracic aorta. The heart size and mediastinal contours
are within normal limits. Both lungs are clear. The visualized
skeletal structures are unremarkable.
IMPRESSION: No active disease.
